# Patient Record
Sex: Female | Born: 1985 | Race: Black or African American | Hispanic: No | Marital: Single | Smoking: Never smoker
Health system: Southern US, Community
[De-identification: ages and names within clinical notes are randomized; demographics above are authoritative.]

## PROBLEM LIST (undated history)

## (undated) ENCOUNTER — Inpatient Hospital Stay (HOSPITAL_COMMUNITY): Payer: Self-pay

## (undated) DIAGNOSIS — I10 Essential (primary) hypertension: Secondary | ICD-10-CM

## (undated) HISTORY — PX: BREAST LUMPECTOMY: SHX2

---
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
## 2004-07-15 ENCOUNTER — Emergency Department (HOSPITAL_COMMUNITY): Admission: EM | Admit: 2004-07-15 | Discharge: 2004-07-15 | Payer: Self-pay | Admitting: Emergency Medicine

## 2005-04-09 ENCOUNTER — Other Ambulatory Visit: Admission: RE | Admit: 2005-04-09 | Discharge: 2005-04-09 | Payer: Self-pay | Admitting: Obstetrics and Gynecology

## 2005-04-27 ENCOUNTER — Encounter: Admission: RE | Admit: 2005-04-27 | Discharge: 2005-04-27 | Payer: Self-pay | Admitting: Allergy and Immunology

## 2006-04-10 ENCOUNTER — Other Ambulatory Visit: Admission: RE | Admit: 2006-04-10 | Discharge: 2006-04-10 | Payer: Self-pay | Admitting: Obstetrics and Gynecology

## 2006-05-12 ENCOUNTER — Emergency Department (HOSPITAL_COMMUNITY): Admission: EM | Admit: 2006-05-12 | Discharge: 2006-05-12 | Payer: Self-pay | Admitting: *Deleted

## 2007-02-27 ENCOUNTER — Emergency Department (HOSPITAL_COMMUNITY): Admission: EM | Admit: 2007-02-27 | Discharge: 2007-02-27 | Payer: Self-pay | Admitting: Emergency Medicine

## 2007-05-20 ENCOUNTER — Inpatient Hospital Stay (HOSPITAL_COMMUNITY): Admission: AD | Admit: 2007-05-20 | Discharge: 2007-05-20 | Payer: Self-pay | Admitting: Obstetrics & Gynecology

## 2007-05-27 ENCOUNTER — Inpatient Hospital Stay (HOSPITAL_COMMUNITY): Admission: AD | Admit: 2007-05-27 | Discharge: 2007-05-27 | Payer: Self-pay | Admitting: Obstetrics & Gynecology

## 2007-08-03 ENCOUNTER — Inpatient Hospital Stay (HOSPITAL_COMMUNITY): Admission: AD | Admit: 2007-08-03 | Discharge: 2007-08-03 | Payer: Self-pay | Admitting: Obstetrics & Gynecology

## 2007-08-27 ENCOUNTER — Ambulatory Visit (HOSPITAL_COMMUNITY): Admission: RE | Admit: 2007-08-27 | Discharge: 2007-08-27 | Payer: Self-pay | Admitting: Obstetrics

## 2007-09-12 ENCOUNTER — Inpatient Hospital Stay (HOSPITAL_COMMUNITY): Admission: AD | Admit: 2007-09-12 | Discharge: 2007-09-12 | Payer: Self-pay | Admitting: Obstetrics

## 2007-10-26 ENCOUNTER — Inpatient Hospital Stay (HOSPITAL_COMMUNITY): Admission: AD | Admit: 2007-10-26 | Discharge: 2007-10-26 | Payer: Self-pay | Admitting: Obstetrics & Gynecology

## 2007-11-11 ENCOUNTER — Inpatient Hospital Stay (HOSPITAL_COMMUNITY): Admission: AD | Admit: 2007-11-11 | Discharge: 2007-11-11 | Payer: Self-pay | Admitting: Obstetrics

## 2007-11-27 ENCOUNTER — Inpatient Hospital Stay (HOSPITAL_COMMUNITY): Admission: AD | Admit: 2007-11-27 | Discharge: 2007-11-27 | Payer: Self-pay | Admitting: Obstetrics & Gynecology

## 2007-12-02 ENCOUNTER — Inpatient Hospital Stay (HOSPITAL_COMMUNITY): Admission: AD | Admit: 2007-12-02 | Discharge: 2007-12-09 | Payer: Self-pay | Admitting: Obstetrics

## 2007-12-08 ENCOUNTER — Encounter: Payer: Self-pay | Admitting: Obstetrics & Gynecology

## 2007-12-21 ENCOUNTER — Inpatient Hospital Stay (HOSPITAL_COMMUNITY): Admission: AD | Admit: 2007-12-21 | Discharge: 2007-12-21 | Payer: Self-pay | Admitting: Obstetrics

## 2007-12-26 ENCOUNTER — Observation Stay (HOSPITAL_COMMUNITY): Admission: AD | Admit: 2007-12-26 | Discharge: 2007-12-26 | Payer: Self-pay | Admitting: Obstetrics & Gynecology

## 2007-12-27 ENCOUNTER — Inpatient Hospital Stay (HOSPITAL_COMMUNITY): Admission: AD | Admit: 2007-12-27 | Discharge: 2007-12-30 | Payer: Self-pay | Admitting: Obstetrics & Gynecology

## 2008-03-18 ENCOUNTER — Emergency Department (HOSPITAL_COMMUNITY): Admission: EM | Admit: 2008-03-18 | Discharge: 2008-03-18 | Payer: Self-pay | Admitting: Emergency Medicine

## 2008-04-09 ENCOUNTER — Emergency Department (HOSPITAL_COMMUNITY): Admission: EM | Admit: 2008-04-09 | Discharge: 2008-04-09 | Payer: Self-pay | Admitting: Emergency Medicine

## 2008-06-21 ENCOUNTER — Emergency Department (HOSPITAL_COMMUNITY): Admission: EM | Admit: 2008-06-21 | Discharge: 2008-06-21 | Payer: Self-pay | Admitting: Emergency Medicine

## 2008-08-02 ENCOUNTER — Emergency Department (HOSPITAL_COMMUNITY): Admission: EM | Admit: 2008-08-02 | Discharge: 2008-08-02 | Payer: Self-pay | Admitting: Emergency Medicine

## 2008-09-21 ENCOUNTER — Encounter: Payer: Self-pay | Admitting: Family Medicine

## 2008-10-18 ENCOUNTER — Encounter: Payer: Self-pay | Admitting: Family Medicine

## 2008-10-18 ENCOUNTER — Ambulatory Visit: Payer: Self-pay | Admitting: Family Medicine

## 2008-10-18 DIAGNOSIS — R030 Elevated blood-pressure reading, without diagnosis of hypertension: Secondary | ICD-10-CM | POA: Insufficient documentation

## 2008-10-18 DIAGNOSIS — J309 Allergic rhinitis, unspecified: Secondary | ICD-10-CM | POA: Insufficient documentation

## 2008-10-18 DIAGNOSIS — J302 Other seasonal allergic rhinitis: Secondary | ICD-10-CM | POA: Insufficient documentation

## 2008-11-10 ENCOUNTER — Encounter: Payer: Self-pay | Admitting: Family Medicine

## 2008-11-23 ENCOUNTER — Encounter: Payer: Self-pay | Admitting: Family Medicine

## 2008-11-30 ENCOUNTER — Encounter: Payer: Self-pay | Admitting: Family Medicine

## 2009-03-28 ENCOUNTER — Ambulatory Visit: Payer: Self-pay | Admitting: Family Medicine

## 2009-03-30 ENCOUNTER — Encounter: Payer: Self-pay | Admitting: Family Medicine

## 2009-03-30 ENCOUNTER — Ambulatory Visit: Payer: Self-pay | Admitting: Family Medicine

## 2009-12-08 ENCOUNTER — Encounter: Payer: Self-pay | Admitting: Family Medicine

## 2009-12-08 ENCOUNTER — Ambulatory Visit: Payer: Self-pay | Admitting: Family Medicine

## 2009-12-08 DIAGNOSIS — K219 Gastro-esophageal reflux disease without esophagitis: Secondary | ICD-10-CM | POA: Insufficient documentation

## 2009-12-08 DIAGNOSIS — R109 Unspecified abdominal pain: Secondary | ICD-10-CM | POA: Insufficient documentation

## 2009-12-09 ENCOUNTER — Telehealth (INDEPENDENT_AMBULATORY_CARE_PROVIDER_SITE_OTHER): Payer: Self-pay | Admitting: *Deleted

## 2009-12-23 ENCOUNTER — Emergency Department (HOSPITAL_COMMUNITY): Admission: EM | Admit: 2009-12-23 | Discharge: 2009-12-23 | Payer: Self-pay | Admitting: Emergency Medicine

## 2010-06-25 ENCOUNTER — Emergency Department (HOSPITAL_COMMUNITY): Admission: EM | Admit: 2010-06-25 | Discharge: 2010-06-25 | Payer: Self-pay | Admitting: Emergency Medicine

## 2010-09-25 ENCOUNTER — Encounter: Payer: Self-pay | Admitting: Family Medicine

## 2010-09-25 DIAGNOSIS — J45909 Unspecified asthma, uncomplicated: Secondary | ICD-10-CM | POA: Insufficient documentation

## 2011-01-23 NOTE — Miscellaneous (Signed)
  Clinical Lists Changes  Problems: Changed problem from ASTHMA (ICD-493.90) to ASTHMA, PERSISTENT (ICD-493.90) 

## 2011-03-11 LAB — URINALYSIS, ROUTINE W REFLEX MICROSCOPIC
Glucose, UA: NEGATIVE mg/dL
Hgb urine dipstick: NEGATIVE
Nitrite: NEGATIVE
Urobilinogen, UA: 0.2 mg/dL (ref 0.0–1.0)

## 2011-03-11 LAB — CBC
HCT: 35.9 % — ABNORMAL LOW (ref 36.0–46.0)
Hemoglobin: 11.9 g/dL — ABNORMAL LOW (ref 12.0–15.0)
MCH: 29.1 pg (ref 26.0–34.0)
RBC: 4.1 MIL/uL (ref 3.87–5.11)
RDW: 11.9 % (ref 11.5–15.5)
WBC: 8.5 10*3/uL (ref 4.0–10.5)

## 2011-03-11 LAB — WET PREP, GENITAL: Trich, Wet Prep: NONE SEEN

## 2011-05-08 NOTE — H&P (Signed)
NAMESHAAKIRA, BORRERO               ACCOUNT NO.:  [id number]   MEDICAL RECORD NO.:  [id number]          PATIENT TYPE:  INP   LOCATION:  9168                          [facility]ILITY:  [facility]   PHYSICIAN:  Roseanna Rainbow, M.D.DATE OF BIRTH:  1986-07-01   DATE OF ADMISSION:  12/27/2007  DATE OF DISCHARGE:                              HISTORY & PHYSICAL   CHIEF COMPLAINT:  The patient is a 25 year old para 0 with an  intrauterine pregnancy at 37 weeks complaining of uterine contractions.   HISTORY OF PRESENT ILLNESS:  Please see the above.   ALLERGIES:  NO KNOWN DRUG ALLERGIES.   MEDICATIONS:  None.   OB RISK FACTORS:  GBS asymptomatic bacteriuria.   PRENATAL LABS:  Quad screen negative.  Platelets 223,000, hemoglobin  10.2, hematocrit 34.1.  Chlamydia probe negative.  Urine culture and  sensitivity with insignificant growth on July 01, 2007.  Sickle cell  negative, HIV negative. 1-hour GTT 112, rubella immune, RPR nonreactive.  Blood type O positive, antibody screen negative, hepatitis B surface  antigen negative.  On May 27, 2007, urine culture and sensitivity 10,000  colonies per mL, group beta strep.   PAST GYN HISTORY:  Noncontributory.   PAST MEDICAL HISTORY:  Asthma.   PAST SURGICAL HISTORY:  1. Herniorrhaphy.  2. Benign cyst removed from left breast.   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  Does not give any  significant history of alcohol usage.  Has no significant smoking  history.   FAMILY HISTORY:  No major illnesses known.   PHYSICAL EXAMINATION:  VITAL SIGNS:  Stable, afebrile.  PELVIC:  Fetal heart tracing reassuring.  Sterile vaginal exam per the  RN:  She is 6 cm dilated, 90% effaced.   ASSESSMENT:  Intrauterine pregnancy at 37 weeks, protracted latent  phase, fetal heart tracing consistent with fetal wellbeing.  Group beta  strep positive.   PLAN:  Admission, penicillin, GBS prophylaxis, augmentation of labor  with low-dose Pitocin per  protocol.  Monitor progress.      Roseanna Rainbow, M.D.  Electronically Signed     LAJ/MEDQ  D:  12/28/2007  T:  12/28/2007  Job:  [id number]

## 2011-05-11 NOTE — Discharge Summary (Signed)
Susan Crawford, Susan Crawford               ACCOUNT NO.:  [id number]   MEDICAL RECORD NO.:  [id number]          PATIENT TYPE:  INP   LOCATION:  9155                          [facility]   PHYSICIAN:  Roseanna Rainbow, M.D.DATE OF BIRTH:  [birth year]   DATE OF ADMISSION:  12/02/2007  DATE OF DISCHARGE:  12/09/2007                               DISCHARGE SUMMARY   CHIEF COMPLAINT:  The patient is a 25 year old gravida 1 with an  estimated date of confinement of January 25, who presents complaining of  uterine contractions and vaginal bleeding.   HISTORY OF PRESENT ILLNESS:  Please see the above.   PAST SURGICAL HISTORY:  Herniorrhaphy and excision of a breast cyst.   PAST MEDICAL HISTORY:  She denies.   MEDICATIONS:  Prenatal vitamins.   ALLERGIES:  No known drug allergies.   SOCIAL HISTORY:  No tobacco, ethanol or drug use.   PHYSICAL EXAM:  VITAL SIGNS:  Stable, afebrile.  LUNGS:  Clear to auscultation bilaterally.  HEART:  Regular rate and rhythm.  STERILE VAGINAL EXAM:  The cervix is 1 cm dilated, 50% effaced, with the  vertex at a -1 station.  Fetal heart tracing reactive.  Uterine  contractions every 3-5 minutes on tocodynamometer.   Ultrasound:  The cervix is shortened.   ASSESSMENT AND PLAN:  Intrauterine pregnancy at 33+ weeks, preterm  uterine contractions.  The plan was admission, magnesium sulfate  tocolysis.   HOSPITAL COURSE:  The patient was admitted.  Magnesium sulfate was  initiated parenterally.  She was adequately tocolyzed.  On December 11  her cervix was 2 cm dilated, 50% effaced, soft and midposition.  The  magnesium sulfate was discontinued.  It was felt that she had a likely  viral gastroenteritis.  On December 12 she complained of chest tightness  and her O2 saturations were greater than 95% on room air.  Chest exam:  Occasional rhonchi.  The patient also has a history of asthma and this  was felt to be an acute asthma exacerbation.  The chest x-ray  showed an  interstitial edema.  Her pulmonary status remained stable.  Maternal-  fetal medicine was consulted.  A maternal echocardiogram was recommended  to rule out cardiac disease, also a 24-hour urine was also recommended  to rule out preeclampsia.  Heightened fetal surveillance was  recommended, including weekly NSTs and serial growth ultrasounds  monthly.  The maternal echocardiogram was normal.  A repeat chest x-ray:  There were no infiltrates or pleural effusions.  The 24-hour urine was  normal with 68 mg per day of protein.  At this point the patient was  discharged home.   DISCHARGE DIAGNOSES:  1. Intrauterine pregnancy at 34+ weeks with threatened preterm labor.  2. Third trimester vaginal bleeding.  3. Asthma exacerbation.  4. Mild pulmonary edema.  5. Possible viral syndrome.   CONDITION:  Improved and stable.   DIET:  Regular.   ACTIVITY:  Modified bed rest, pelvic rest.   MEDICATIONS:  Zofran, albuterol inhaler.   DISPOSITION:  The patient had an appointment in the office on December  19  at 10 a.m.      Roseanna Rainbow, M.D.  Electronically Signed     LAJ/MEDQ  D:  01/09/2008  T:  01/10/2008  Job:  [id number]

## 2011-05-11 NOTE — Op Note (Signed)
[place]  Patient:    Susan Crawford, Susan Crawford Visit Number: [id number] MRN: [id number]          Service Type: DSU Location: [facility] Attending Physician:  Vikki Ports Dictated by:   Vikki Ports, M.D. Proc. Date: 01/30/02 Admit Date:  01/30/2002                             Operative Report  PREOPERATIVE DIAGNOSIS:  Left breast mass.  POSTOPERATIVE DIAGNOSIS:  Left breast mass.  PROCEDURE:  Left breast biopsy.  SURGEON:  Vikki Ports, M.D.  ANESTHESIA:  Local MAC.  DESCRIPTION OF PROCEDURE:  The patient was taken to the operating room and placed in the supine position.  After adequate anesthesia was induced using MAC technique, the left breast was then prepped and draped in a normal sterile fashion.  Using 1% lidocaine local anesthesia, the skin and subcutaneous tissue surrounding the mass was anesthetized.  A small curvilinear incision was made in the 6 oclock region of the left breast.  Dissected down through subcutaneous tissue until palpable mobile mass.  It was grasped with a 3-0 nylon suture and delivered up in through the wound.  All adhesions were taken off of it.  It was completely excised and sent for pathologic evaluation.  Adequate hemostasis was ensured and the skin was closed with subcuticular 4-0 Monocryl.  Steri-Strips and a sterile dressing was applied. The patient tolerated the procedure well and went to PACU in good condition. Dictated by:   Vikki Ports, M.D. Attending Physician:  Danna Hefty R. DD:  01/30/02 TD:  01/31/02 Job: [id number]

## 2011-07-27 ENCOUNTER — Emergency Department (HOSPITAL_COMMUNITY)
Admission: EM | Admit: 2011-07-27 | Discharge: 2011-07-28 | Disposition: A | Discharge status: Home / Self Care | Payer: Self-pay | Attending: Emergency Medicine | Admitting: Emergency Medicine

## 2011-07-27 DIAGNOSIS — M545 Low back pain, unspecified: Secondary | ICD-10-CM | POA: Insufficient documentation

## 2011-07-27 DIAGNOSIS — R630 Anorexia: Secondary | ICD-10-CM | POA: Insufficient documentation

## 2011-07-27 DIAGNOSIS — N739 Female pelvic inflammatory disease, unspecified: Secondary | ICD-10-CM | POA: Insufficient documentation

## 2011-07-27 DIAGNOSIS — R509 Fever, unspecified: Secondary | ICD-10-CM | POA: Insufficient documentation

## 2011-07-27 DIAGNOSIS — N898 Other specified noninflammatory disorders of vagina: Secondary | ICD-10-CM | POA: Insufficient documentation

## 2011-07-27 DIAGNOSIS — R112 Nausea with vomiting, unspecified: Secondary | ICD-10-CM | POA: Insufficient documentation

## 2011-07-27 LAB — DIFFERENTIAL
Basophils Relative: 0 % (ref 0–1)
Eosinophils Absolute: 0 10*3/uL (ref 0.0–0.7)
Eosinophils Relative: 1 % (ref 0–5)
Monocytes Relative: 12 % (ref 3–12)

## 2011-07-27 LAB — POCT PREGNANCY, URINE: Preg Test, Ur: NEGATIVE

## 2011-07-27 LAB — CBC
HCT: 37.9 % (ref 36.0–46.0)
MCHC: 31.7 g/dL (ref 30.0–36.0)
MCV: 86.3 fL (ref 78.0–100.0)
RDW: 12 % (ref 11.5–15.5)

## 2011-07-28 LAB — BASIC METABOLIC PANEL
CO2: 25 mEq/L (ref 19–32)
Calcium: 9.8 mg/dL (ref 8.4–10.5)
Chloride: 100 mEq/L (ref 96–112)
Creatinine, Ser: 0.78 mg/dL (ref 0.50–1.10)
GFR calc Af Amer: >60 mL/min (ref >60)
Glucose, Bld: 117 mg/dL — ABNORMAL HIGH (ref 70–99)
Potassium: 3.5 mEq/L (ref 3.5–5.1)

## 2011-07-28 LAB — URINALYSIS, ROUTINE W REFLEX MICROSCOPIC
Bilirubin Urine: NEGATIVE
Glucose, UA: NEGATIVE mg/dL
Leukocytes, UA: NEGATIVE
Nitrite: NEGATIVE
pH: 6.5 (ref 5.0–8.0)

## 2011-09-13 LAB — CBC
HCT: 31.2 — ABNORMAL LOW
Hemoglobin: 11.9 — ABNORMAL LOW
MCHC: 33.2
MCHC: 33.8
MCHC: 34
MCV: 86.6
MCV: 86.9
MCV: 87.1
Platelets: 210
Platelets: 234
RBC: 3.36 — ABNORMAL LOW
RBC: 4.05
RDW: 12.9
WBC: 14.4 — ABNORMAL HIGH
WBC: 8.1

## 2011-09-13 LAB — COMPREHENSIVE METABOLIC PANEL
ALT: 12
Albumin: 2.4 — ABNORMAL LOW
Alkaline Phosphatase: 180 — ABNORMAL HIGH
BUN: 4 — ABNORMAL LOW
Calcium: 9.1
Chloride: 106
Creatinine, Ser: 0.81
GFR calc Af Amer: >60
GFR calc non Af Amer: >60
Total Protein: 5.9 — ABNORMAL LOW

## 2011-09-13 LAB — RPR: RPR Ser Ql: NONREACTIVE

## 2011-09-13 LAB — LACTATE DEHYDROGENASE: LDH: 148

## 2011-09-20 LAB — COMPREHENSIVE METABOLIC PANEL
ALT: 14
BUN: 10
Calcium: 9.7
Creatinine, Ser: 0.77
Glucose, Bld: 90
Sodium: 137
Total Protein: 8.4 — ABNORMAL HIGH

## 2011-09-20 LAB — CBC
Hemoglobin: 13.4
MCHC: 32.8
MCV: 85.7
RDW: 11.9

## 2011-09-20 LAB — URINALYSIS, ROUTINE W REFLEX MICROSCOPIC
Glucose, UA: NEGATIVE
Nitrite: NEGATIVE
Specific Gravity, Urine: 1.023
pH: 6

## 2011-09-20 LAB — DIFFERENTIAL
Lymphocytes Relative: 15
Lymphs Abs: 1.1
Monocytes Relative: 8
Neutro Abs: 5.5
Neutrophils Relative %: 73

## 2011-09-20 LAB — URINE MICROSCOPIC-ADD ON

## 2011-09-20 LAB — POCT PREGNANCY, URINE: Preg Test, Ur: NEGATIVE

## 2011-09-20 LAB — LIPASE, BLOOD: Lipase: 21

## 2011-09-28 LAB — CREATININE CLEARANCE, URINE, 24 HOUR
Collection Interval-CRCL: 24
Creatinine Clearance: 181 — ABNORMAL HIGH
Creatinine, 24H Ur: 1462
Creatinine, Urine: 43

## 2011-09-28 LAB — URINALYSIS, ROUTINE W REFLEX MICROSCOPIC
Glucose, UA: NEGATIVE
Leukocytes, UA: NEGATIVE
Protein, ur: NEGATIVE
Specific Gravity, Urine: <1.005 — ABNORMAL LOW
pH: 5.5

## 2011-09-28 LAB — COMPREHENSIVE METABOLIC PANEL
AST: 14
Albumin: 2.5 — ABNORMAL LOW
Alkaline Phosphatase: 131 — ABNORMAL HIGH
Chloride: 105
Creatinine, Ser: 0.56
GFR calc Af Amer: >60
Potassium: 3.7
Total Bilirubin: 0.5
Total Protein: 6.1

## 2011-09-28 LAB — PROTEIN, URINE, 24 HOUR
Protein, 24H Urine: 68
Urine Total Volume-UPROT: 3400

## 2011-09-28 LAB — URINE MICROSCOPIC-ADD ON: WBC, UA: NONE SEEN

## 2011-09-28 LAB — CBC
Platelets: 233
RDW: 12.8
WBC: 12.4 — ABNORMAL HIGH

## 2011-10-01 ENCOUNTER — Emergency Department (HOSPITAL_COMMUNITY)
Admission: EM | Admit: 2011-10-01 | Discharge: 2011-10-01 | Disposition: A | Discharge status: Home / Self Care | Payer: Medicaid Other | Attending: Emergency Medicine | Admitting: Emergency Medicine

## 2011-10-01 DIAGNOSIS — N949 Unspecified condition associated with female genital organs and menstrual cycle: Secondary | ICD-10-CM | POA: Insufficient documentation

## 2011-10-01 DIAGNOSIS — J069 Acute upper respiratory infection, unspecified: Secondary | ICD-10-CM | POA: Insufficient documentation

## 2011-10-01 LAB — URINALYSIS, ROUTINE W REFLEX MICROSCOPIC
Bilirubin Urine: NEGATIVE
Glucose, UA: NEGATIVE
Glucose, UA: NEGATIVE
Glucose, UA: NEGATIVE
Glucose, UA: NEGATIVE mg/dL
Hgb urine dipstick: NEGATIVE
Hgb urine dipstick: NEGATIVE
Hgb urine dipstick: NEGATIVE
Ketones, ur: NEGATIVE
Ketones, ur: NEGATIVE
Leukocytes, UA: NEGATIVE
Nitrite: NEGATIVE
Protein, ur: NEGATIVE
Protein, ur: NEGATIVE
Specific Gravity, Urine: 1.01
Specific Gravity, Urine: 1.028 (ref 1.005–1.030)
Urobilinogen, UA: 0.2
pH: 6.5
pH: 7.5
pH: 5.5 (ref 5.0–8.0)

## 2011-10-01 LAB — COMPREHENSIVE METABOLIC PANEL
ALT: 16
AST: 23
AST: 25
Albumin: 2.9 — ABNORMAL LOW
Alkaline Phosphatase: 137 — ABNORMAL HIGH
Alkaline Phosphatase: 149 — ABNORMAL HIGH
BUN: 3 — ABNORMAL LOW
BUN: 3 — ABNORMAL LOW
CO2: 26
Calcium: 9.1
Chloride: 105
Chloride: 106
Creatinine, Ser: 0.55
Creatinine, Ser: 0.62
GFR calc Af Amer: >60
GFR calc Af Amer: >60
GFR calc non Af Amer: >60
Glucose, Bld: 85
Potassium: 3.3 — ABNORMAL LOW
Potassium: 3.7
Sodium: 133 — ABNORMAL LOW
Total Bilirubin: 0.6
Total Bilirubin: 0.8
Total Protein: 6.5
Total Protein: 6.6

## 2011-10-01 LAB — CBC
HCT: 33.1 — ABNORMAL LOW
HCT: 33.7 — ABNORMAL LOW
Hemoglobin: 11.1 — ABNORMAL LOW
MCHC: 33.6
MCV: 88.8
Platelets: 220
RBC: 3.24 — ABNORMAL LOW
RDW: 12.4
RDW: 12.5
WBC: 19.9 — ABNORMAL HIGH
WBC: 8.3

## 2011-10-01 LAB — DIFFERENTIAL
Basophils Relative: 1
Lymphs Abs: 1.3
Monocytes Relative: 10
Neutro Abs: 5.7
Neutrophils Relative %: 72

## 2011-10-01 LAB — WET PREP, GENITAL
Trich, Wet Prep: NONE SEEN
Trich, Wet Prep: NONE SEEN
Yeast Wet Prep HPF POC: NONE SEEN

## 2011-10-01 LAB — GC/CHLAMYDIA PROBE AMP, GENITAL: Chlamydia, DNA Probe: NEGATIVE

## 2011-10-01 LAB — URINE CULTURE: Culture: NO GROWTH

## 2011-10-01 LAB — URINE MICROSCOPIC-ADD ON

## 2011-10-02 LAB — URINALYSIS, ROUTINE W REFLEX MICROSCOPIC
Bilirubin Urine: NEGATIVE
Glucose, UA: NEGATIVE
Hgb urine dipstick: NEGATIVE
Nitrite: NEGATIVE
Protein, ur: NEGATIVE
Protein, ur: NEGATIVE
Specific Gravity, Urine: 1.01
Specific Gravity, Urine: 1.015
Urobilinogen, UA: 0.2
Urobilinogen, UA: 0.2

## 2011-10-02 LAB — URINE MICROSCOPIC-ADD ON

## 2011-10-02 LAB — GC/CHLAMYDIA PROBE AMP, GENITAL
Chlamydia, DNA Probe: NEGATIVE
GC Probe Amp, Genital: NEGATIVE

## 2011-10-04 LAB — URINALYSIS, ROUTINE W REFLEX MICROSCOPIC
Bilirubin Urine: NEGATIVE
Glucose, UA: NEGATIVE
Hgb urine dipstick: NEGATIVE
Ketones, ur: NEGATIVE
Nitrite: NEGATIVE
Protein, ur: NEGATIVE
Specific Gravity, Urine: >1.03 — ABNORMAL HIGH
Urobilinogen, UA: 0.2
pH: 6

## 2011-10-04 LAB — URINE MICROSCOPIC-ADD ON

## 2011-10-08 LAB — URINALYSIS, ROUTINE W REFLEX MICROSCOPIC
Bilirubin Urine: NEGATIVE
Glucose, UA: NEGATIVE
Hgb urine dipstick: NEGATIVE
Ketones, ur: NEGATIVE
Protein, ur: NEGATIVE
pH: 7

## 2011-10-08 LAB — WET PREP, GENITAL: Clue Cells Wet Prep HPF POC: NONE SEEN

## 2011-10-11 LAB — URINALYSIS, ROUTINE W REFLEX MICROSCOPIC
Ketones, ur: 15 — AB
Leukocytes, UA: NEGATIVE
Nitrite: NEGATIVE
Protein, ur: 30 — AB

## 2011-10-11 LAB — URINE CULTURE: Colony Count: 10000

## 2011-11-22 ENCOUNTER — Emergency Department (HOSPITAL_COMMUNITY): Payer: Medicaid Other

## 2011-11-22 ENCOUNTER — Encounter: Payer: Self-pay | Admitting: Nurse Practitioner

## 2011-11-22 ENCOUNTER — Emergency Department (HOSPITAL_COMMUNITY)
Admission: EM | Admit: 2011-11-22 | Discharge: 2011-11-22 | Disposition: A | Discharge status: Home / Self Care | Payer: Medicaid Other | Attending: Emergency Medicine | Admitting: Emergency Medicine

## 2011-11-22 DIAGNOSIS — J45909 Unspecified asthma, uncomplicated: Secondary | ICD-10-CM | POA: Insufficient documentation

## 2011-11-22 DIAGNOSIS — M79609 Pain in unspecified limb: Secondary | ICD-10-CM | POA: Insufficient documentation

## 2011-11-22 DIAGNOSIS — M722 Plantar fascial fibromatosis: Secondary | ICD-10-CM

## 2011-11-22 NOTE — ED Provider Notes (Signed)
History     CSN: [id number] Arrival date & time: 11/22/2011 11:17 AM   First MD Initiated Contact with Patient 11/22/11 1231      Chief Complaint  Patient presents with  . Foot Pain    (Consider location/radiation/quality/duration/timing/severity/associated sxs/prior treatment) HPI Comments: Patient reports pain at the bottom of her left foot just in front of her left heel over the last several days. She reports bearing weight makes pain worse. She has been taking some "pain medication" and trying to rest it without any significant relief. She has been wearing soft soled slippers which she states does improve her pain. She denies any trauma, injury, redness, rash, ulcerations to the bottom of her foot. She denies any pain to her Achilles area, left calf or elsewhere on her lower sternum and knee. She denies any numbness or weakness. No fever. She does stand and walk at her job for long periods of time, but denies any increase in her usual workload recently.  Patient is a 25 y.o. female presenting with lower extremity pain. The history is provided by the patient.  Foot Pain    Past Medical History  Diagnosis Date  . Asthma     Past Surgical History  Procedure Date  . Breast lumpectomy     History reviewed. No pertinent family history.  History  Substance Use Topics  . Smoking status: Never Smoker   . Smokeless tobacco: Not on file  . Alcohol Use: Yes     rare    OB History    Grav Para Term Preterm Abortions TAB SAB Ect Mult Living                  Review of Systems  Constitutional: Negative.   Cardiovascular: Negative for leg swelling.  Musculoskeletal: Positive for arthralgias.  Skin: Negative for color change, pallor, rash and wound.  Neurological: Negative for weakness and numbness.    Allergies  Review of patient's allergies indicates no known allergies.  Home Medications   Current Outpatient Rx  Name Route Sig Dispense Refill  . ALBUTEROL SULFATE  HFA 108 (90 BASE) MCG/ACT IN AERS Inhalation Inhale 1 puff into the lungs every 4 (four) hours as needed. For shortness of breath    . CETIRIZINE HCL 10 MG PO TABS Oral Take 10 mg by mouth daily as needed. For allergies      BP 121/81  Pulse 80  Temp(Src) 97.9 F (36.6 C) (Oral)  Resp 16  Ht 5\' 5"  (1.651 m)  Wt 172 lb (78.019 kg)  BMI 28.62 kg/m2  SpO2 99%  Physical Exam  Nursing note and vitals reviewed. Constitutional: She is oriented to person, place, and time. She appears well-developed and well-nourished.  Musculoskeletal: Normal range of motion. She exhibits tenderness. She exhibits no edema.       Feet:  Neurological: She is alert and oriented to person, place, and time.  Skin: Skin is warm and dry.  Psychiatric: She has a normal mood and affect.    ED Course  Procedures (including critical care time)  Labs Reviewed - No data to display Dg Foot Complete Right  11/22/2011  *RADIOLOGY REPORT*  Clinical Data: Heel pain.  RIGHT FOOT COMPLETE - 3+ VIEW  Comparison: None.  Findings: No acute bony abnormality.  Specifically, no fracture, subluxation, or dislocation.  Soft tissues are intact.  IMPRESSION: No acute bony abnormality.  Original Report Authenticated By: Cyndie Chime, M.D.     1. Plantar cellulitis  MDM    Probably plantar fasciitis.  xrays I reviewed, neg per radiologist.  RICE at home.  Work note.        Gavin Pound. Tyric Rodeheaver, MD 11/22/11 1306

## 2011-11-22 NOTE — Discharge Instructions (Signed)
 Be sure to keep your left leg elevated when resting. He may want to use ice packs for about 15 minutes in the bottom of her foot 3 times a day for the next 3 days. I would also advise continuing to take ibuprofen  400-600 mg with food 3 times a day. This will help decrease any swelling and inflammation. I also would encourage that you buy gel inserts for your shoes which will also decrease the pressure on your feet. Pain should subside in the next one to 2 weeks. If this does not occur I recommend following up with an orthopedist, sports physician or your regular doctor.

## 2011-11-22 NOTE — ED Notes (Signed)
C/o R heel pain x 2 days. States no injuries but pain is increasingly worse since onset. Ambulatory

## 2012-03-09 ENCOUNTER — Emergency Department (HOSPITAL_COMMUNITY)
Admission: EM | Admit: 2012-03-09 | Discharge: 2012-03-10 | Disposition: A | Discharge status: Home / Self Care | Payer: BC Managed Care – PPO | Attending: Emergency Medicine | Admitting: Emergency Medicine

## 2012-03-09 ENCOUNTER — Encounter (HOSPITAL_COMMUNITY): Payer: Self-pay | Admitting: *Deleted

## 2012-03-09 DIAGNOSIS — R05 Cough: Secondary | ICD-10-CM | POA: Insufficient documentation

## 2012-03-09 DIAGNOSIS — R51 Headache: Secondary | ICD-10-CM | POA: Insufficient documentation

## 2012-03-09 DIAGNOSIS — R059 Cough, unspecified: Secondary | ICD-10-CM | POA: Insufficient documentation

## 2012-03-09 DIAGNOSIS — J329 Chronic sinusitis, unspecified: Secondary | ICD-10-CM | POA: Insufficient documentation

## 2012-03-09 MED ORDER — AMOXICILLIN 500 MG PO CAPS: 500.0000 mg | ORAL_CAPSULE | Freq: Three times a day (TID) | ORAL | Status: DC

## 2012-03-09 MED ORDER — AMOXICILLIN 500 MG PO CAPS: 500.0000 mg | ORAL_CAPSULE | Freq: Three times a day (TID) | ORAL | Status: AC

## 2012-03-09 MED ORDER — IBUPROFEN 800 MG PO TABS
800.0000 mg | ORAL_TABLET | Freq: Once | ORAL | Status: AC
  Administered 2012-03-10: 800 mg via ORAL
  Filled 2012-03-09: qty 1

## 2012-03-09 MED ORDER — CETIRIZINE-PSEUDOEPHEDRINE ER 5-120 MG PO TB12: 1.0000 | ORAL_TABLET | Freq: Every day | ORAL | Status: DC

## 2012-03-09 NOTE — ED Provider Notes (Signed)
History     CSN: [id number]  Arrival date & time 03/09/12  2101   First MD Initiated Contact with Patient 03/09/12 2333      Chief Complaint  Patient presents with  . Nasal Congestion  . Cough  . Headache     HPI  History provided by the patient. Patient is a 26 year old female with history of allergies and asthma presents with complaints of nasal congestion, sinus pressure and slight cough for the past 3 weeks. Symptoms have been persistent and are described as moderate. Patient has been using Zyrtec with no significant improvement of symptoms. Today patient also reports having generalized headache. She reports increased fatigue over the past several weeks. With slight decrease in appetite. Patient denies having any fever, chills, sweats, nausea, vomiting or diarrhea. SHe denies any chest pain or shortness of breath.   Past Medical History  Diagnosis Date  . Asthma     Past Surgical History  Procedure Date  . Breast lumpectomy     No family history on file.  History  Substance Use Topics  . Smoking status: Never Smoker   . Smokeless tobacco: Not on file  . Alcohol Use: Yes     rare    OB History    Grav Para Term Preterm Abortions TAB SAB Ect Mult Living                  Review of Systems  Constitutional: Positive for appetite change. Negative for fever and chills.  HENT: Positive for congestion, sore throat, rhinorrhea and sinus pressure. Negative for ear pain.   Respiratory: Positive for cough. Negative for shortness of breath and wheezing.   Cardiovascular: Negative for chest pain.  Gastrointestinal: Negative for nausea, vomiting, abdominal pain, diarrhea and constipation.  All other systems reviewed and are negative.    Allergies  Review of patient's allergies indicates no known allergies.  Home Medications   Current Outpatient Rx  Name Route Sig Dispense Refill  . GOODY HEADACHE PO Oral Take 1 packet by mouth every 6 (six) hours as needed. For  headache    . CETIRIZINE HCL 10 MG PO TABS Oral Take 10 mg by mouth daily as needed. For allergies      BP 133/86  Pulse 82  Temp(Src) 98.8 F (37.1 C) (Oral)  Resp 19  SpO2 100%  LMP 03/09/2012  Physical Exam  Nursing note and vitals reviewed. Constitutional: She is oriented to person, place, and time. She appears well-developed and well-nourished. No distress.  HENT:  Head: Normocephalic.  Right Ear: Tympanic membrane normal.       Mild tenderness over bilateral maxillary sinuses. Nasal congestion with poor air movement her nostrils. Nostril mucosa edematous with green yellow mucus. Mild erythema left TM.  Neck: Normal range of motion. Neck supple.       No meningeal sign  Cardiovascular: Normal rate and regular rhythm.   Pulmonary/Chest: Effort normal and breath sounds normal. No respiratory distress. She has no wheezes. She has no rales.  Abdominal: Soft. She exhibits no distension. There is no tenderness.  Lymphadenopathy:    She has no cervical adenopathy.  Neurological: She is alert and oriented to person, place, and time.  Skin: Skin is warm and dry. No rash noted.  Psychiatric: She has a normal mood and affect. Her behavior is normal.    ED Course  Procedures     1. Sinusitis      MDM  11:45 PM patient seen and evaluated. Patient  in no acute distress.        Angus Seller, [place] 03/10/12 [id number]

## 2012-03-09 NOTE — ED Notes (Signed)
Pt states that she has had nasal congestion, a cough, and a headache x 3 weeks.  Pt also complains that she is unable to sleep.

## 2012-03-10 NOTE — Discharge Instructions (Signed)
You were seen and evaluated today for your symptoms of nasal congestion, sinus pressure and headache. At this time your providers are worried of a sinus infection causing her symptoms. You're provided and she should for an antibiotic to take for the next 10 days. Please take this for the full length of time. Continue drink plenty of fluids to stay hydrated. Please followup to primary care provider.  Sinusitis Sinuses are air pockets within the bones of your face. The growth of bacteria within a sinus leads to infection. The infection prevents the sinuses from draining. This infection is called sinusitis. SYMPTOMS  There will be different areas of pain depending on which sinuses have become infected.  The maxillary sinuses often produce pain beneath the eyes.   Frontal sinusitis may cause pain in the middle of the forehead and above the eyes.  Other problems (symptoms) include:  Toothaches.   Colored, pus-like (purulent) drainage from the nose.   Swelling, warmth, and tenderness over the sinus areas may be signs of infection.  TREATMENT  Sinusitis is most often determined by an exam.X-rays may be taken. If x-rays have been taken, make sure you obtain your results or find out how you are to obtain them. Your caregiver may give you medications (antibiotics). These are medications that will help kill the bacteria causing the infection. You may also be given a medication (decongestant) that helps to reduce sinus swelling.  HOME CARE INSTRUCTIONS   Only take over-the-counter or prescription medicines for pain, discomfort, or fever as directed by your caregiver.   Drink extra fluids. Fluids help thin the mucus so your sinuses can drain more easily.   Applying either moist heat or ice packs to the sinus areas may help relieve discomfort.   Use saline nasal sprays to help moisten your sinuses. The sprays can be found at your local drugstore.  SEEK IMMEDIATE MEDICAL CARE IF:  You have a fever.    You have increasing pain, severe headaches, or toothache.   You have nausea, vomiting, or drowsiness.   You develop unusual swelling around the face or trouble seeing.  MAKE SURE YOU:   Understand these instructions.   Will watch your condition.   Will get help right away if you are not doing well or get worse.  Document Released: 12/10/2005 Document Revised: 11/29/2011 Document Reviewed: 07/09/2007 [facility] Patient Information 2012 [place], [place].    RESOURCE GUIDE  Dental Problems  Patients with Medicaid: [facility] [phone] [address]                                           [phone] [address] Phone:  [phone]                                                  Phone:  [phone]  If unable to pay or uninsured, contact:  [facility] or [facility]. to become qualified for the adult dental clinic.  Chronic Pain Problems Contact [facility]  [phone] Patients need to be referred by their primary care doctor.  Insufficient Money for  Medicine Contact United Way:  call "211" or [facility] [phone].  No Primary Care Doctor Call [facility]  [phone] Other agencies that provide inexpensive medical care    [facility]  [phone]    [facility]  [phone]    [facility]  9858752853    [facility]  [phone]    [facility]  [phone]    [facility]  [phone]  Psychological Services [facility]  [phone] [facility] Services  [phone] [facility]   [phone] (emergency services ([phone])  Substance Abuse Resources [facility]  920-736-7126 [facility] 870-650-7989 [facility] [phone] Floydene Flock [phone] Residential & Outpatient Substance Abuse Program  [phone] [phone]  Abuse/Neglect [facility] Child Abuse Hotline 8285717010 [facility] Child Abuse Hotline 212-832-2757 (After Hours)  Emergency Shelter [facility] 404-459-9249  Maternity Homes [facility] at [facility] [place] [facility] 343 527 6357 [place] Alert Services [phone]  MRSA Hotline #:   (219)178-5548    [facility] Resources  [facility]     [employer]                          [facility] Dept. [address] [place]                       [address]      [address]  Blondell Reveal Phone:  [phone]                                   Phone:  [phone]                 Phone:  [phone]  [facility] Mental Health Phone:  [phone]  [facility] Child Abuse Hotline 9077549934 [phone] (After Hours)

## 2012-03-14 NOTE — ED Provider Notes (Signed)
Medical screening examination/treatment/procedure(s) were performed by non-physician practitioner and as supervising physician I was immediately available for consultation/collaboration.  Jawanda Passey, MD 03/14/12 1925 

## 2012-04-07 ENCOUNTER — Encounter (HOSPITAL_COMMUNITY): Payer: Self-pay | Admitting: *Deleted

## 2012-04-07 ENCOUNTER — Inpatient Hospital Stay (HOSPITAL_COMMUNITY)
Admission: AD | Admit: 2012-04-07 | Discharge: 2012-04-07 | Disposition: A | Discharge status: Home / Self Care | Payer: Medicaid Other | Source: Ambulatory Visit | Attending: Obstetrics and Gynecology | Admitting: Obstetrics and Gynecology

## 2012-04-07 DIAGNOSIS — R109 Unspecified abdominal pain: Secondary | ICD-10-CM | POA: Insufficient documentation

## 2012-04-07 DIAGNOSIS — N72 Inflammatory disease of cervix uteri: Secondary | ICD-10-CM | POA: Insufficient documentation

## 2012-04-07 DIAGNOSIS — N949 Unspecified condition associated with female genital organs and menstrual cycle: Secondary | ICD-10-CM | POA: Insufficient documentation

## 2012-04-07 LAB — URINALYSIS, ROUTINE W REFLEX MICROSCOPIC
Nitrite: NEGATIVE
Specific Gravity, Urine: 1.01 (ref 1.005–1.030)
Urobilinogen, UA: 0.2 mg/dL (ref 0.0–1.0)

## 2012-04-07 LAB — WET PREP, GENITAL: Clue Cells Wet Prep HPF POC: NONE SEEN

## 2012-04-07 LAB — POCT PREGNANCY, URINE: Preg Test, Ur: NEGATIVE

## 2012-04-07 MED ORDER — CEFTRIAXONE SODIUM 250 MG IJ SOLR
250.0000 mg | Freq: Once | INTRAMUSCULAR | Status: AC
  Administered 2012-04-07: 250 mg via INTRAMUSCULAR
  Filled 2012-04-07: qty 250

## 2012-04-07 MED ORDER — AZITHROMYCIN 250 MG PO TABS
1000.0000 mg | ORAL_TABLET | Freq: Once | ORAL | Status: AC
  Administered 2012-04-07: 1000 mg via ORAL
  Filled 2012-04-07: qty 4

## 2012-04-07 MED ORDER — METRONIDAZOLE 500 MG PO TABS: 500.0000 mg | ORAL_TABLET | Freq: Two times a day (BID) | ORAL | Status: AC

## 2012-04-07 NOTE — Discharge Instructions (Signed)
Cervicitis   Cervicitis is a soreness and swelling (inflammation) of the cervix. Your cervix is located at the bottom of your uterus which opens up to the vagina.   CAUSES   Sexually transmitted infections (STIs).   Allergic reaction.   Medicines or birth control devices that are put in the vagina.   Injury to the cervix.   Bacterial infections.   SYMPTOMS   There may be no symptoms. If symptoms occur, they may include:   Grey, white, yellow, or bad smelling vaginal discharge.   Pain or itching of the area outside the vagina.   Painful sexual intercourse.   Lower abdominal or lower back pain, especially during intercourse.   Frequent urination.   Abnormal vaginal bleeding between periods, after sexual intercourse, or after menopause.   Pressure or a heavy feeling in the pelvis.   DIAGNOSIS   Diagnosis is made after a pelvic exam. Other tests may include:   Examination of any discharge under a microscope (wet prep).   A Pap test.   TREATMENT   Treatment will depend on the cause of cervicitis. If it is caused by an STI, both you and your partner will need to be treated. Antibiotic medicines will be given.   HOME CARE INSTRUCTIONS   Do not have sexual intercourse until your caregiver says it is okay.   Do not have sexual intercourse until your partner has been treated if your cervicitis is caused by an STI.   Take your antibiotics as directed. Finish them even if you start to feel better.   SEEK IMMEDIATE MEDICAL CARE IF:   Your symptoms come back.   You have a fever.   You experience any problems that may be related to the medicine you are taking.   MAKE SURE YOU:   Understand these instructions.   Will watch your condition.   Will get help right away if you are not doing well or get worse.   Document Released: 12/10/2005 Document Revised: 11/29/2011 Document Reviewed: 07/09/2011   [employer] Patient Information 2012 [employer].

## 2012-04-07 NOTE — MAU Provider Note (Signed)
History     CSN: [id number]  Arrival date & time 04/07/12  1135   None     Chief Complaint  Patient presents with  . Abdominal Pain    HPI Susan Crawford is a 26 y.o. female who presents to MAU for pelvic pain that started 2 weeks ago. The pain comes and goes, nothing makes better or worse. Vaginal discharge past week that is yellow and thick. Current sex partner x 3 years, history of Chlamydia 2 years ago. Patient states she is thinking this could be Chlamydia again. The history was provided by the patient.   Past Medical History  Diagnosis Date  . Asthma     Past Surgical History  Procedure Date  . Breast lumpectomy     Family History  Problem Relation Age of Onset  . Asthma Mother   . Hypertension Mother   . Hypertension Maternal Grandmother   . Asthma Maternal Grandmother     History  Substance Use Topics  . Smoking status: Never Smoker   . Smokeless tobacco: Not on file  . Alcohol Use: Yes     rare    OB History    Grav Para Term Preterm Abortions TAB SAB Ect Mult Living   1 1  1      1       Review of Systems  Constitutional: Negative for fever, chills, diaphoresis and fatigue.  HENT: Positive for sinus pressure. Negative for ear pain, congestion, sore throat, facial swelling, neck pain, neck stiffness and dental problem.   Eyes: Negative for photophobia, pain and discharge.  Respiratory: Negative for cough, chest tightness and wheezing.   Cardiovascular: Negative.   Gastrointestinal: Positive for abdominal pain. Negative for nausea, vomiting, diarrhea, constipation and abdominal distention.  Genitourinary: Positive for vaginal discharge and pelvic pain. Negative for dysuria, urgency, frequency, flank pain, vaginal bleeding, difficulty urinating and vaginal pain.  Musculoskeletal: Negative for myalgias, back pain and gait problem.  Skin: Negative for color change and rash.  Neurological: Negative for dizziness, speech difficulty, weakness,  light-headedness, numbness and headaches.  Psychiatric/Behavioral: Negative for confusion and agitation. The patient is not nervous/anxious.     Allergies  Review of patient's allergies indicates no known allergies.  Home Medications  No current outpatient prescriptions on file.  BP 129/79  Pulse 98  Temp(Src) 97.6 F (36.4 C) (Oral)  Resp 18  Ht 5\' 5"  (1.651 m)  Wt 177 lb 12.8 oz (80.65 kg)  BMI 29.59 kg/m2  SpO2 100%  LMP 03/18/2012  Physical Exam  Nursing note and vitals reviewed. Constitutional: She is oriented to person, place, and time. She appears well-developed and well-nourished.  HENT:  Head: Normocephalic.  Eyes: EOM are normal.  Neck: Neck supple.  Cardiovascular: Normal rate.   Pulmonary/Chest: Effort normal.  Abdominal: Soft. There is no tenderness.  Genitourinary:       External genitalia without lesions. Frothy discharge vaginal vault. Cervix inflamed, strawberry appearance, no CMT, no Adnexal tenderness, uterus without palpable enlargement.  Musculoskeletal: Normal range of motion.  Neurological: She is alert and oriented to person, place, and time. No cranial nerve deficit.  Skin: Skin is warm and dry.  Psychiatric: She has a normal mood and affect. Her behavior is normal. Judgment and thought content normal.   Results for orders placed during the hospital encounter of 04/07/12 (from the past 24 hour(s))  URINALYSIS, ROUTINE W REFLEX MICROSCOPIC     Status: Normal   Collection Time   04/07/12 11:55 AM  Component Value Range   Color, Urine YELLOW  YELLOW    APPearance CLEAR  CLEAR    Specific Gravity, Urine 1.010  1.005 - 1.030    pH 7.0  5.0 - 8.0    Glucose, UA NEGATIVE  NEGATIVE (mg/dL)   Hgb urine dipstick NEGATIVE  NEGATIVE    Bilirubin Urine NEGATIVE  NEGATIVE    Ketones, ur NEGATIVE  NEGATIVE (mg/dL)   Protein, ur NEGATIVE  NEGATIVE (mg/dL)   Urobilinogen, UA 0.2  0.0 - 1.0 (mg/dL)   Nitrite NEGATIVE  NEGATIVE    Leukocytes, UA NEGATIVE   NEGATIVE   POCT PREGNANCY, URINE     Status: Normal   Collection Time   04/07/12 12:09 PM      Component Value Range   Preg Test, Ur NEGATIVE  NEGATIVE   WET PREP, GENITAL     Status: Abnormal   Collection Time   04/07/12  3:15 PM      Component Value Range   Yeast Wet Prep HPF POC NONE SEEN  NONE SEEN    Trich, Wet Prep NONE SEEN  NONE SEEN    Clue Cells Wet Prep HPF POC NONE SEEN  NONE SEEN    WBC, Wet Prep HPF POC FEW (*) NONE SEEN    Assessment: Cervicitis   Vaginal discharge  Plan:  Rocephin 250 mg IM   Zithromax 1 gram po   Rx flagyl   Follow up with PCP   Return here as needed. ED Course  Procedures    MDM

## 2012-04-07 NOTE — MAU Note (Signed)
Patient states she has been having lower abdominal pain for about 2 weeks. Has a slight yellow discharge with no odor.

## 2012-04-08 LAB — GC/CHLAMYDIA PROBE AMP, GENITAL: GC Probe Amp, Genital: NEGATIVE

## 2012-04-09 NOTE — MAU Provider Note (Signed)
Agree with above note.  Panda Crossin 04/09/2012 7:12 AM   

## 2012-04-26 ENCOUNTER — Encounter (HOSPITAL_COMMUNITY): Payer: Self-pay | Admitting: *Deleted

## 2012-04-26 ENCOUNTER — Emergency Department (HOSPITAL_COMMUNITY)
Admission: EM | Admit: 2012-04-26 | Discharge: 2012-04-26 | Disposition: A | Discharge status: Home / Self Care | Payer: Medicaid Other | Attending: Emergency Medicine | Admitting: Emergency Medicine

## 2012-04-26 DIAGNOSIS — M542 Cervicalgia: Secondary | ICD-10-CM | POA: Insufficient documentation

## 2012-04-26 DIAGNOSIS — S139XXA Sprain of joints and ligaments of unspecified parts of neck, initial encounter: Secondary | ICD-10-CM | POA: Insufficient documentation

## 2012-04-26 DIAGNOSIS — J45909 Unspecified asthma, uncomplicated: Secondary | ICD-10-CM | POA: Insufficient documentation

## 2012-04-26 DIAGNOSIS — S161XXA Strain of muscle, fascia and tendon at neck level, initial encounter: Secondary | ICD-10-CM

## 2012-04-26 MED ORDER — CYCLOBENZAPRINE HCL 10 MG PO TABS
5.0000 mg | ORAL_TABLET | Freq: Once | ORAL | Status: AC
  Administered 2012-04-26: 5 mg via ORAL
  Filled 2012-04-26: qty 1

## 2012-04-26 MED ORDER — CYCLOBENZAPRINE HCL 5 MG PO TABS: 5.0000 mg | ORAL_TABLET | Freq: Once | ORAL | Status: AC

## 2012-04-26 MED ORDER — IBUPROFEN 800 MG PO TABS
800.0000 mg | ORAL_TABLET | Freq: Once | ORAL | Status: AC
  Administered 2012-04-26: 800 mg via ORAL
  Filled 2012-04-26: qty 1

## 2012-04-26 MED ORDER — IBUPROFEN 800 MG PO TABS: 800.0000 mg | ORAL_TABLET | Freq: Once | ORAL | Status: AC
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
## 2012-04-26 NOTE — Discharge Instructions (Signed)
Cervical Sprain  A cervical sprain is when the ligaments in the neck stretch or tear. The ligaments are the tissues that hold the neck bones in place.  HOME CARE    Put ice on the injured area.   Put ice in a plastic bag.   Place a towel between your skin and the bag.   Leave the ice on for 15 to 20 minutes, 3 to 4 times a day.   Only take medicine as told by your doctor.   Keep all doctor visits as told.   Keep all physical therapy visits as told.   If your doctor gives you a neck collar, wear it as told.   Do not drive while wearing a neck collar.   Adjust your work station so that you have good posture while you work.   Avoid positions and activities that make your problems worse.   Warm up and stretch before being active.  GET HELP RIGHT AWAY IF:    You are bleeding or your stomach is upset.   You have an allergic reaction to your medicine.   Your problems (symptoms) get worse.   You develop new problems.   You lose feeling (numbness) or you cannot move (paralysis) any part of your body.   You have tingling or weakness in any part of your body.   Your pain is not controlled with medicine.   You cannot take less pain medicine over time as planned.   Your activity level does not improve as expected.  MAKE SURE YOU:    Understand these instructions.   Will watch your condition.   Will get help right away if you are not doing well or get worse.  Document Released: 05/28/2008 Document Revised: 11/29/2011 Document Reviewed: 09/13/2011  [employer] Patient Information 2012 [employer]

## 2012-04-26 NOTE — ED Provider Notes (Signed)
Medical screening examination/treatment/procedure(s) were performed by non-physician practitioner and as supervising physician I was immediately available for consultation/collaboration.  Tyheem Boughner R. Mirai Greenwood, MD 04/26/12 2336 

## 2012-09-03 ENCOUNTER — Emergency Department (HOSPITAL_COMMUNITY)
Admission: EM | Admit: 2012-09-03 | Discharge: 2012-09-03 | Disposition: A | Discharge status: Home / Self Care | Payer: Medicaid Other | Attending: Emergency Medicine | Admitting: Emergency Medicine

## 2012-09-03 ENCOUNTER — Encounter (HOSPITAL_COMMUNITY): Payer: Self-pay | Admitting: *Deleted

## 2012-09-03 DIAGNOSIS — N898 Other specified noninflammatory disorders of vagina: Secondary | ICD-10-CM

## 2012-09-03 DIAGNOSIS — R109 Unspecified abdominal pain: Secondary | ICD-10-CM

## 2012-09-03 LAB — URINE MICROSCOPIC-ADD ON

## 2012-09-03 LAB — URINALYSIS, ROUTINE W REFLEX MICROSCOPIC
Glucose, UA: NEGATIVE mg/dL
Hgb urine dipstick: NEGATIVE
Specific Gravity, Urine: 1.029 (ref 1.005–1.030)
Urobilinogen, UA: 1 mg/dL (ref 0.0–1.0)

## 2012-09-03 LAB — WET PREP, GENITAL
Trich, Wet Prep: NONE SEEN
Yeast Wet Prep HPF POC: NONE SEEN

## 2012-09-03 MED ORDER — OXYCODONE-ACETAMINOPHEN 5-325 MG PO TABS
2.0000 | ORAL_TABLET | Freq: Once | ORAL | Status: DC
  Filled 2012-09-03: qty 2

## 2012-09-03 MED ORDER — AZITHROMYCIN 1 G PO PACK
1.0000 g | PACK | Freq: Once | ORAL | Status: AC
  Administered 2012-09-03: 1 g via ORAL
  Filled 2012-09-03: qty 1

## 2012-09-03 MED ORDER — IBUPROFEN 800 MG PO TABS
800.0000 mg | ORAL_TABLET | Freq: Once | ORAL | Status: AC
  Administered 2012-09-03: 800 mg via ORAL
  Filled 2012-09-03: qty 1

## 2012-09-03 MED ORDER — CEFTRIAXONE SODIUM 250 MG IJ SOLR
250.0000 mg | Freq: Once | INTRAMUSCULAR | Status: AC
  Administered 2012-09-03: 250 mg via INTRAMUSCULAR
  Filled 2012-09-03: qty 250

## 2012-09-03 MED ORDER — LIDOCAINE HCL 1 % IJ SOLN
INTRAMUSCULAR | Status: AC
  Administered 2012-09-03: 0.6 mL
  Filled 2012-09-03: qty 20

## 2012-09-03 NOTE — ED Provider Notes (Signed)
Medical screening examination/treatment/procedure(s) were performed by non-physician practitioner and as supervising physician I was immediately available for consultation/collaboration.  Toy Baker, MD 09/03/12 [phone]

## 2012-09-03 NOTE — ED Provider Notes (Signed)
History     CSN: [id number]  Arrival date & time 09/03/12  4098   First MD Initiated Contact with Patient 09/03/12 [phone]      Chief Complaint  Patient presents with  . Abdominal Pain  . Vaginal Discharge    (Consider location/radiation/quality/duration/timing/severity/associated sxs/prior treatment) HPI Comments: Susan Crawford 26 y.o. female   The chief complaint is: Patient presents with:   Abdominal Pain   Vaginal Discharge   The patient has medical history significant for:   Past Medical History:   Asthma                                                      Patient presents with lower abdominal pain rated 7/10 and non-foul smelling yellow discharge X 1 week. Patient states that her fiance is in the [employer] and she beliefs she has "someone on the side".  She does not use a barrier method on contraception. Denies fever or chills. Denies NVD. Denies dysuria, urgency, frequency, or hematuria. Denies dysparaunia. LMP was last week     Patient is a 26 y.o. female presenting with vaginal discharge. The history is provided by the patient.  Vaginal Discharge Associated symptoms include abdominal pain. Pertinent negatives include no chills, fever, nausea or vomiting.    Past Medical History  Diagnosis Date  . Asthma     Past Surgical History  Procedure Date  . Breast lumpectomy     Family History  Problem Relation Age of Onset  . Asthma Mother   . Hypertension Mother   . Hypertension Maternal Grandmother   . Asthma Maternal Grandmother     History  Substance Use Topics  . Smoking status: Never Smoker   . Smokeless tobacco: Not on file  . Alcohol Use: Yes     rare    OB History    Grav Para Term Preterm Abortions TAB SAB Ect Mult Living   1 1  1      1       Review of Systems  Constitutional: Negative for fever and chills.  Gastrointestinal: Positive for abdominal pain. Negative for nausea, vomiting and diarrhea.  Genitourinary: Positive for vaginal  discharge. Negative for dysuria, urgency, frequency and dyspareunia.  All other systems reviewed and are negative.    Allergies  Review of patient's allergies indicates no known allergies.  Home Medications   Current Outpatient Rx  Name Route Sig Dispense Refill  . GOODY HEADACHE PO Oral Take 1 packet by mouth every 6 (six) hours as needed. For headache    . CETIRIZINE-PSEUDOEPHEDRINE ER 5-120 MG PO TB12 Oral Take 1 tablet by mouth daily.    . ADULT MULTIVITAMIN W/MINERALS CH Oral Take 1 tablet by mouth daily.      BP 131/84  Pulse 96  Temp 98.6 F (37 C) (Oral)  Resp 18  SpO2 100%  LMP 08/19/2012  Physical Exam  Nursing note and vitals reviewed. Constitutional: She appears well-developed and well-nourished. No distress.  HENT:  Head: Normocephalic and atraumatic.  Mouth/Throat: Oropharynx is clear and moist.  Eyes: Conjunctivae normal and EOM are normal. No scleral icterus.  Neck: Normal range of motion. Neck supple.  Cardiovascular: Normal rate, regular rhythm and normal heart sounds.   Pulmonary/Chest: Effort normal and breath sounds normal.  Abdominal: Soft. Bowel sounds are normal. There is no  tenderness.  Genitourinary: Uterus normal. Cervix exhibits motion tenderness, discharge and friability. Right adnexum displays no mass, no tenderness and no fullness. Left adnexum displays no mass, no tenderness and no fullness. Vaginal discharge found.    Neurological: She is alert.  Skin: Skin is warm and dry.    ED Course  Procedures (including critical care time)  Labs Reviewed  URINALYSIS, ROUTINE W REFLEX MICROSCOPIC - Abnormal; Notable for the following:    Leukocytes, UA SMALL (*)     All other components within normal limits  WET PREP, GENITAL - Abnormal; Notable for the following:    WBC, Wet Prep HPF POC FEW (*)     All other components within normal limits  URINE MICROSCOPIC-ADD ON - Abnormal; Notable for the following:    Squamous Epithelial / LPF FEW (*)      Bacteria, UA FEW (*)     All other components within normal limits  POCT PREGNANCY, URINE  GC/CHLAMYDIA PROBE AMP, GENITAL   Results for orders placed during the hospital encounter of 09/03/12  URINALYSIS, ROUTINE W REFLEX MICROSCOPIC      Component Value Range   Color, Urine YELLOW  YELLOW   APPearance CLEAR  CLEAR   Specific Gravity, Urine 1.029  1.005 - 1.030   pH 6.5  5.0 - 8.0   Glucose, UA NEGATIVE  NEGATIVE mg/dL   Hgb urine dipstick NEGATIVE  NEGATIVE   Bilirubin Urine NEGATIVE  NEGATIVE   Ketones, ur NEGATIVE  NEGATIVE mg/dL   Protein, ur NEGATIVE  NEGATIVE mg/dL   Urobilinogen, UA 1.0  0.0 - 1.0 mg/dL   Nitrite NEGATIVE  NEGATIVE   Leukocytes, UA SMALL (*) NEGATIVE  WET PREP, GENITAL      Component Value Range   Yeast Wet Prep HPF POC NONE SEEN  NONE SEEN   Trich, Wet Prep NONE SEEN  NONE SEEN   Clue Cells Wet Prep HPF POC NONE SEEN  NONE SEEN   WBC, Wet Prep HPF POC FEW (*) NONE SEEN  POCT PREGNANCY, URINE      Component Value Range   Preg Test, Ur NEGATIVE  NEGATIVE  URINE MICROSCOPIC-ADD ON      Component Value Range   Squamous Epithelial / LPF FEW (*) RARE   WBC, UA 0-2  <3 WBC/hpf   Bacteria, UA FEW (*) RARE   Urine-Other MUCOUS PRESENT      No results found.   1. Vaginal Discharge   2. Abdominal pain       MDM  Patient presented with vaginal discharge and abdominal pain X 1 week. Pain rated 7/10. UPT: negative, UA: small leukocytes and few bacteria. Wet prep: WBC's. Patient give Ibuprofen for pain. Patient treated empircally for GC/chlamydia in ED with rocephin and Azithromycin. Patient informed that she will be called if positive, and responsible for informing her partners. No red flags for tuboovarian abscess, ovarian torsion, or Fitz-Hugh-Curtis. Return precautions given.        Susan Casino, PA-C 09/03/12 1145

## 2012-09-03 NOTE — ED Notes (Signed)
Pt reports lower abd pain, yellow, thick vaginal d/c x1 week. Denies n/v/d.

## 2012-09-04 LAB — GC/CHLAMYDIA PROBE AMP, GENITAL: GC Probe Amp, Genital: NEGATIVE

## 2012-10-03 ENCOUNTER — Emergency Department (HOSPITAL_COMMUNITY)
Admission: EM | Admit: 2012-10-03 | Discharge: 2012-10-03 | Disposition: A | Discharge status: Home / Self Care | Payer: Medicaid Other | Attending: Emergency Medicine | Admitting: Emergency Medicine

## 2012-10-03 ENCOUNTER — Encounter (HOSPITAL_COMMUNITY): Payer: Self-pay | Admitting: Emergency Medicine

## 2012-10-03 DIAGNOSIS — J45909 Unspecified asthma, uncomplicated: Secondary | ICD-10-CM | POA: Insufficient documentation

## 2012-10-03 DIAGNOSIS — R109 Unspecified abdominal pain: Secondary | ICD-10-CM | POA: Insufficient documentation

## 2012-10-03 DIAGNOSIS — N949 Unspecified condition associated with female genital organs and menstrual cycle: Secondary | ICD-10-CM | POA: Insufficient documentation

## 2012-10-03 LAB — URINALYSIS, MICROSCOPIC ONLY
Hgb urine dipstick: NEGATIVE
Ketones, ur: NEGATIVE mg/dL
Leukocytes, UA: NEGATIVE
Protein, ur: NEGATIVE mg/dL
Urobilinogen, UA: 1 mg/dL (ref 0.0–1.0)

## 2012-10-03 LAB — WET PREP, GENITAL
Clue Cells Wet Prep HPF POC: NONE SEEN
Trich, Wet Prep: NONE SEEN

## 2012-10-03 LAB — POCT PREGNANCY, URINE: Preg Test, Ur: NEGATIVE

## 2012-10-03 NOTE — ED Provider Notes (Signed)
History     CSN: [id number]  Arrival date & time 10/03/12  0806   First MD Initiated Contact with Patient 10/03/12 0818      Chief Complaint  Patient presents with  . Abdominal Pain    (Consider location/radiation/quality/duration/timing/severity/associated sxs/prior treatment) Patient is a 26 y.o. female presenting with abdominal pain. The history is provided by the patient.  Abdominal Pain The primary symptoms of the illness include abdominal pain and vaginal discharge. The primary symptoms of the illness do not include shortness of breath, nausea, vomiting or diarrhea.  Symptoms associated with the illness do not include back pain.   patient presents with lower abdominal pain. It began 2 ago and comes and goes. No dysuria. No nausea vomiting diarrhea. No constipation. Patient states that when she was given a urine sample here she noted she had some vaginal discharge. She states she has had unprotected sex. No fevers. She was seen a month ago for similar symptoms.  Past Medical History  Diagnosis Date  . Asthma     Past Surgical History  Procedure Date  . Breast lumpectomy     Family History  Problem Relation Age of Onset  . Asthma Mother   . Hypertension Mother   . Hypertension Maternal Grandmother   . Asthma Maternal Grandmother     History  Substance Use Topics  . Smoking status: Never Smoker   . Smokeless tobacco: Not on file  . Alcohol Use: Yes     rare    OB History    Grav Para Term Preterm Abortions TAB SAB Ect Mult Living   1 1  1      1       Review of Systems  Constitutional: Negative for activity change and appetite change.  HENT: Negative for neck stiffness.   Eyes: Negative for pain.  Respiratory: Negative for chest tightness and shortness of breath.   Cardiovascular: Negative for chest pain and leg swelling.  Gastrointestinal: Positive for abdominal pain. Negative for nausea, vomiting and diarrhea.  Genitourinary: Positive for vaginal  discharge. Negative for flank pain.  Musculoskeletal: Negative for back pain.  Skin: Negative for rash.  Neurological: Negative for weakness, numbness and headaches.  Psychiatric/Behavioral: Negative for behavioral problems.    Allergies  Review of patient's allergies indicates no known allergies.  Home Medications   Current Outpatient Rx  Name Route Sig Dispense Refill  . GOODY HEADACHE PO Oral Take 1 packet by mouth every 6 (six) hours as needed. For headache    . CETIRIZINE-PSEUDOEPHEDRINE ER 5-120 MG PO TB12 Oral Take 1 tablet by mouth daily.    . ADULT MULTIVITAMIN W/MINERALS CH Oral Take 1 tablet by mouth daily.      BP 128/83  Pulse 87  Temp 98.8 F (37.1 C) (Oral)  Resp 16  SpO2 100%  LMP 09/21/2012  Physical Exam  Nursing note and vitals reviewed. Constitutional: She is oriented to person, place, and time. She appears well-developed and well-nourished.  HENT:  Head: Normocephalic and atraumatic.  Eyes: EOM are normal. Pupils are equal, round, and reactive to light.  Neck: Normal range of motion. Neck supple.  Cardiovascular: Normal rate, regular rhythm and normal heart sounds.   No murmur heard. Pulmonary/Chest: Effort normal and breath sounds normal. No respiratory distress. She has no wheezes. She has no rales.  Abdominal: Soft. Bowel sounds are normal. She exhibits no distension. There is tenderness. There is no rebound and no guarding.       Minimal suprapubic  tenderness without rebound or guarding.  Musculoskeletal: Normal range of motion.  Neurological: She is alert and oriented to person, place, and time. No cranial nerve deficit.  Skin: Skin is warm and dry.  Psychiatric: She has a normal mood and affect. Her speech is normal.   pelvic exam showed no cervical motion tenderness and minimal discharge.  ED Course  Procedures (including critical care time)  Labs Reviewed  URINALYSIS, MICROSCOPIC ONLY - Abnormal; Notable for the following:    APPearance  CLOUDY (*)     Bacteria, UA FEW (*)     All other components within normal limits  WET PREP, GENITAL - Abnormal; Notable for the following:    WBC, Wet Prep HPF POC RARE (*)     All other components within normal limits  POCT PREGNANCY, URINE  GC/CHLAMYDIA PROBE AMP, GENITAL   No results found.   1. Abdominal pain       MDM  Patient with lower abdominal pain. Minimal tenderness. Minimal vaginal discharge. Reassuring wet prep. Patient was treated month ago for STDs and had negative cultures. She'll be discharged home. I doubt appendicitis or ovarian torsion.        Juliet Rude. Rubin Payor, MD 10/03/12 [phone]

## 2012-10-03 NOTE — ED Notes (Signed)
Pt presenting to ed with c/o lower abdominal pain pt denies nausea, vomiting or diarrhea. Pt states normal bowel movement this morning.

## 2012-10-03 NOTE — ED Notes (Signed)
Pt. Has under garments are. Pt. Set up and ready for pelvic exam.

## 2012-10-26 ENCOUNTER — Inpatient Hospital Stay (HOSPITAL_COMMUNITY)

## 2012-10-26 ENCOUNTER — Inpatient Hospital Stay (HOSPITAL_COMMUNITY)
Admission: AD | Admit: 2012-10-26 | Discharge: 2012-10-26 | Disposition: A | Discharge status: Hospice | Source: Ambulatory Visit | Attending: Obstetrics & Gynecology | Admitting: Obstetrics & Gynecology

## 2012-10-26 ENCOUNTER — Encounter (HOSPITAL_COMMUNITY): Payer: Self-pay | Admitting: *Deleted

## 2012-10-26 DIAGNOSIS — O26859 Spotting complicating pregnancy, unspecified trimester: Secondary | ICD-10-CM | POA: Insufficient documentation

## 2012-10-26 LAB — CBC
Hemoglobin: 11.5 g/dL — ABNORMAL LOW (ref 12.0–15.0)
MCH: 28.3 pg (ref 26.0–34.0)
MCHC: 32.2 g/dL (ref 30.0–36.0)
MCV: 87.7 fL (ref 78.0–100.0)
RBC: 4.07 MIL/uL (ref 3.87–5.11)

## 2012-10-26 LAB — ABO/RH: ABO/RH(D): O POS

## 2012-10-26 MED ORDER — PRENATAL MULTIVITAMIN + DHA 28-0.8 & 200 MG PO MISC: 1.0000 | Freq: Every day | ORAL | Status: DC

## 2012-10-26 NOTE — MAU Note (Signed)
Pt had a normal cycle on Sep 29-4 Oct then she had another normal 5 day cycle starting Oct 19th.  No pain.

## 2012-10-26 NOTE — MAU Provider Note (Signed)
History     CSN: [id number]  Arrival date and time: 10/26/12 1521   None     Chief Complaint  Patient presents with  . Early pregnant-spotting    HPI 26 y.o. G2P0101 at Unknown EGA with light bleeding starting yesterday. Patient's last menstrual period was 10/11/2012. Had period prior to that on 09/21/12. No pain.    Past Medical History  Diagnosis Date  . Asthma     Past Surgical History  Procedure Date  . Breast lumpectomy     Family History  Problem Relation Age of Onset  . Asthma Mother   . Hypertension Mother   . Hypertension Maternal Grandmother   . Asthma Maternal Grandmother     History  Substance Use Topics  . Smoking status: Never Smoker   . Smokeless tobacco: Not on file  . Alcohol Use: Yes     Comment: rare    Allergies: No Known Allergies  Prescriptions prior to admission  Medication Sig Dispense Refill  . Prenatal Vit-Fe Fumarate-FA (PRENATAL MULTIVITAMIN) TABS Take 1 tablet by mouth daily.      . [DISCONTINUED] Aspirin-Acetaminophen-Caffeine (GOODY HEADACHE PO) Take 1 packet by mouth every 6 (six) hours as needed. For headache      . [DISCONTINUED] cetirizine-pseudoephedrine (ZYRTEC-D) 5-120 MG per tablet Take 1 tablet by mouth daily.        Review of Systems  Constitutional: Negative.   Respiratory: Negative.   Cardiovascular: Negative.   Gastrointestinal: Negative for nausea, vomiting, abdominal pain, diarrhea and constipation.  Genitourinary: Negative for dysuria, urgency, frequency, hematuria and flank pain.       Positive for spotting   Musculoskeletal: Negative.   Neurological: Negative.   Psychiatric/Behavioral: Negative.    Physical Exam   Blood pressure 125/78, pulse 102, temperature 97.9 F (36.6 C), temperature source Oral, resp. rate 16, height 5\' 5"  (1.651 m), weight 187 lb (84.823 kg), last menstrual period 10/11/2012.  Physical Exam  Nursing note and vitals reviewed. Constitutional: She is oriented to person, place,  and time. She appears well-developed and well-nourished. No distress.  Cardiovascular: Normal rate.   Respiratory: Effort normal.  GI: Soft. There is no tenderness.  Genitourinary: There is no tenderness, lesion or injury on the right labia. There is no tenderness, lesion or injury on the left labia. Uterus is not tender. Cervix exhibits no motion tenderness. Right adnexum displays no mass, no tenderness and no fullness. Left adnexum displays no mass, no tenderness and no fullness. No bleeding (no bleeding noted on glove with bimanual exam) around the vagina. No vaginal discharge found.       Declines cultures and wet prep today - had on 10/11 at prior MAU visit  Musculoskeletal: Normal range of motion.  Neurological: She is alert and oriented to person, place, and time.  Skin: Skin is warm and dry.  Psychiatric: She has a normal mood and affect.    MAU Course  Procedures Results for orders placed during the hospital encounter of 10/26/12 (from the past 24 hour(s))  POCT PREGNANCY, URINE     Status: Abnormal   Collection Time   10/26/12  3:46 PM      Component Value Range   Preg Test, Ur POSITIVE (*) NEGATIVE  CBC     Status: Abnormal   Collection Time   10/26/12  4:25 PM      Component Value Range   WBC 9.2  4.0 - 10.5 K/uL   RBC 4.07  3.87 - 5.11 MIL/uL  Hemoglobin 11.5 (*) 12.0 - 15.0 g/dL   HCT 96.0 (*) 45.4 - 09.8 %   MCV 87.7  78.0 - 100.0 fL   MCH 28.3  26.0 - 34.0 pg   MCHC 32.2  30.0 - 36.0 g/dL   RDW 11.9  14.7 - 82.9 %   Platelets 257  150 - 400 K/uL  ABO/RH     Status: Normal   Collection Time   10/26/12  4:25 PM      Component Value Range   ABO/RH(D) O POS    HCG, QUANTITATIVE, PREGNANCY     Status: Abnormal   Collection Time   10/26/12  4:25 PM      Component Value Range   hCG, Beta Chain, Quant, S 4761 (*) <5 mIU/mL    US Ob Comp Less 14 Wks  10/26/2012  *RADIOLOGY REPORT*  Clinical Data: Vaginal bleeding.  Evaluate early pregnancy.  OBSTETRIC <14 WK [place] AND  TRANSVAGINAL OB US  Technique:  Both transabdominal and transvaginal ultrasound examinations were performed for complete evaluation of the gestation as well as the maternal uterus, adnexal regions, and pelvic cul-de-sac.  Transvaginal technique was performed to assess early pregnancy.  Comparison:  None.  Intrauterine gestational sac:  Visualized/normal in shape. Yolk sac: Present Embryo: None Cardiac Activity: None Heart Rate: N/A bpm  MSD: 6.9 mm  5 w 2 d             [place] EDC: 06/26/2013  Maternal uterus/adnexae: No subchorionic hemorrhage. Normal right ovary. Normal left ovary. No free pelvic fluid.  IMPRESSION:  1.  Intrauterine gestational sac estimated at 5 weeks and 2 days gestation.  A small yolk sac is present but no embryo is visualized yet. 2.  No subchorionic hemorrhage. 3.  Normal ovaries.   Original Report Authenticated By: Rudie Meyer, M.D.    US Ob Transvaginal  10/26/2012  *RADIOLOGY REPORT*  Clinical Data: Vaginal bleeding.  Evaluate early pregnancy.  OBSTETRIC <14 WK [place] AND TRANSVAGINAL OB US  Technique:  Both transabdominal and transvaginal ultrasound examinations were performed for complete evaluation of the gestation as well as the maternal uterus, adnexal regions, and pelvic cul-de-sac.  Transvaginal technique was performed to assess early pregnancy.  Comparison:  None.  Intrauterine gestational sac:  Visualized/normal in shape. Yolk sac: Present Embryo: None Cardiac Activity: None Heart Rate: N/A bpm  MSD: 6.9 mm  5 w 2 d             [place] EDC: 06/26/2013  Maternal uterus/adnexae: No subchorionic hemorrhage. Normal right ovary. Normal left ovary. No free pelvic fluid.  IMPRESSION:  1.  Intrauterine gestational sac estimated at 5 weeks and 2 days gestation.  A small yolk sac is present but no embryo is visualized yet. 2.  No subchorionic hemorrhage. 3.  Normal ovaries.   Original Report Authenticated By: Rudie Meyer, M.D.    Assessment and Plan   1. Spotting in pregnancy         Medication List     As of 10/26/2012  5:25 PM    START taking these medications         PRENATAL MULTIVITAMIN + DHA 28-0.8 & 200 MG Misc   Take 1 tablet by mouth daily.      CONTINUE taking these medications         prenatal multivitamin Tabs      STOP taking these medications         cetirizine-pseudoephedrine 5-120 MG per tablet   Commonly  known as: ZYRTEC-D      GOODY HEADACHE PO          Where to get your medications    These are the prescriptions that you need to pick up. We sent them to a specific pharmacy, so you will need to go there to get them.   [id number] - [place] ([place]), [place] - [address]    [address]  ([address]) [place] [address]    Phone: [phone]        PRENATAL MULTIVITAMIN + DHA 28-0.8 & 200 MG Misc            Follow-up Information    Follow up with [facility] [place].   Contact information:   [address] [place] [address] [phone]      Follow up with [facility]. (start prenatal care as soon as possible)    Contact information:   [address] [place] [address] [phone]           Fidelia Cathers 10/26/2012, 5:25 PM

## 2012-10-26 NOTE — MAU Note (Signed)
Pt states LMP-10/11/2012, had +upt at home, awoke today and wiped, then noted blood. Wearing panty liner. Spotting like beginning of cycle. Denies pain, denies abnormal vaginal discharge.

## 2012-11-13 ENCOUNTER — Inpatient Hospital Stay (HOSPITAL_COMMUNITY)

## 2012-11-13 ENCOUNTER — Inpatient Hospital Stay (HOSPITAL_COMMUNITY)
Admission: AD | Admit: 2012-11-13 | Discharge: 2012-11-13 | Disposition: A | Discharge status: Home / Self Care | Source: Ambulatory Visit | Attending: Obstetrics & Gynecology | Admitting: Obstetrics & Gynecology

## 2012-11-13 ENCOUNTER — Encounter (HOSPITAL_COMMUNITY): Payer: Self-pay | Admitting: *Deleted

## 2012-11-13 DIAGNOSIS — O210 Mild hyperemesis gravidarum: Secondary | ICD-10-CM | POA: Insufficient documentation

## 2012-11-13 DIAGNOSIS — O4690 Antepartum hemorrhage, unspecified, unspecified trimester: Secondary | ICD-10-CM

## 2012-11-13 DIAGNOSIS — O219 Vomiting of pregnancy, unspecified: Secondary | ICD-10-CM

## 2012-11-13 DIAGNOSIS — O26859 Spotting complicating pregnancy, unspecified trimester: Secondary | ICD-10-CM | POA: Insufficient documentation

## 2012-11-13 LAB — URINE MICROSCOPIC-ADD ON

## 2012-11-13 LAB — URINALYSIS, ROUTINE W REFLEX MICROSCOPIC
Nitrite: NEGATIVE
Specific Gravity, Urine: 1.02 (ref 1.005–1.030)
Urobilinogen, UA: 0.2 mg/dL (ref 0.0–1.0)
pH: 7.5 (ref 5.0–8.0)

## 2012-11-13 LAB — WET PREP, GENITAL
Clue Cells Wet Prep HPF POC: NONE SEEN
Yeast Wet Prep HPF POC: NONE SEEN

## 2012-11-13 MED ORDER — ONDANSETRON 8 MG PO TBDP
8.0000 mg | ORAL_TABLET | Freq: Once | ORAL | Status: AC
  Administered 2012-11-13: 8 mg via ORAL
  Filled 2012-11-13: qty 1

## 2012-11-13 MED ORDER — ONDANSETRON 8 MG PO TBDP
8.0000 mg | ORAL_TABLET | Freq: Three times a day (TID) | ORAL | Status: DC | PRN
Start: 2012-11-13 — End: 2012-11-25

## 2012-11-13 NOTE — MAU Note (Signed)
Pt C/O nausea for entire pregnancy but has had excessive vomitting for past 3 days.  Pt also C/O bleeding, started this morning, went to [facility] & there was blood in toilet & on tissue.  Denies pain.  Pt has appt with [facility] on 12/13.

## 2012-11-13 NOTE — MAU Provider Note (Signed)
History     CSN: [id number]  Arrival date and time: 11/13/12 4098   First Provider Initiated Contact with Patient 11/13/12 [phone]      Chief Complaint  Patient presents with  . Vaginal Bleeding  . Emesis During Pregnancy   HPI Susan Crawford is a 26 y.o. G85P0101 female with an Estimated Date of Delivery: 06/26/13 at [redacted]w[redacted]d gestation determined by [place] on 10/26/12 who presents with 3 days of nausea and vomiting and one day of light vaginal bleeding. She is vomiting every couple of hours, and feels nauseas all the time. Reports yellow-greenish foamy emesis, no blood. Can not keep solids or liquids down. Also notes headaches for 6 days. She has not taken medication for either problem as she was unsure what she could take. Vaginal bleeding began this morning, noticed it after wiping after using the bathroom. No spotting on her clothing. Denies any abdominal or pelvic pain.  History of nausea/vomiting with first pregnancy, used Bonine during that time for nausea.   OB History    Grav Para Term Preterm Abortions TAB SAB Ect Mult Living   2 1  1      1       Past Medical History  Diagnosis Date  . Asthma     Past Surgical History  Procedure Date  . Breast lumpectomy     Family History  Problem Relation Age of Onset  . Asthma Mother   . Hypertension Mother   . Hypertension Maternal Grandmother   . Asthma Maternal Grandmother     History  Substance Use Topics  . Smoking status: Never Smoker   . Smokeless tobacco: Not on file  . Alcohol Use: Yes     Comment: rare    Allergies: No Known Allergies  Prescriptions prior to admission  Medication Sig Dispense Refill  . Prenatal Vit-Fe Fumarate-FA (PRENATAL MULTIVITAMIN) TABS Take 1 tablet by mouth daily.        Review of Systems  Constitutional: Negative for fever and chills.  Eyes: Negative for blurred vision and double vision.  Respiratory: Negative for shortness of breath.   Gastrointestinal: Positive for heartburn, nausea  and vomiting. Negative for abdominal pain.  Genitourinary: Negative for dysuria.       Light vaginal bleeding  Neurological: Positive for headaches.   Physical Exam   Blood pressure 130/79, pulse 89, temperature 98.3 F (36.8 C), temperature source Oral, resp. rate 18, height 5\' 5"  (1.651 m), weight 84.369 kg (186 lb), last menstrual period 10/11/2012.  Physical Exam  Nursing note and vitals reviewed. Constitutional: She is oriented to person, place, and time. She appears well-developed and well-nourished. No distress.  Cardiovascular: Normal rate, regular rhythm and normal heart sounds.   Respiratory: Effort normal and breath sounds normal. No respiratory distress.  GI: Soft. There is tenderness (slight upper abdominal tenderness). There is no rebound and no guarding.  Genitourinary: There is no rash, tenderness or lesion on the right labia. There is no rash, tenderness or lesion on the left labia. Uterus is not enlarged. Cervix exhibits no motion tenderness. Right adnexum displays no mass and no tenderness. Left adnexum displays no mass and no tenderness. There is bleeding (small amount) around the vagina. Vaginal discharge (white, milky) found.       Erythema noted around the cervical os. Cervix closed. Uterus consistent with dates.  Neurological: She is alert and oriented to person, place, and time.  Skin: Skin is warm and dry.  Psychiatric: She has a normal mood  and affect.   Results for orders placed during the hospital encounter of 11/13/12 (from the past 24 hour(s))  URINALYSIS, ROUTINE W REFLEX MICROSCOPIC     Status: Abnormal   Collection Time   11/13/12  8:50 AM      Component Value Range   Color, Urine YELLOW  YELLOW   APPearance CLEAR  CLEAR   Specific Gravity, Urine 1.020  1.005 - 1.030   pH 7.5  5.0 - 8.0   Glucose, UA NEGATIVE  NEGATIVE mg/dL   Hgb urine dipstick TRACE (*) NEGATIVE   Bilirubin Urine NEGATIVE  NEGATIVE   Ketones, ur NEGATIVE  NEGATIVE mg/dL    Protein, ur NEGATIVE  NEGATIVE mg/dL   Urobilinogen, UA 0.2  0.0 - 1.0 mg/dL   Nitrite NEGATIVE  NEGATIVE   Leukocytes, UA NEGATIVE  NEGATIVE  URINE MICROSCOPIC-ADD ON     Status: Abnormal   Collection Time   11/13/12  8:50 AM      Component Value Range   Squamous Epithelial / LPF MANY (*) RARE   RBC / HPF 0-2  <3 RBC/hpf  WET PREP, GENITAL     Status: Abnormal   Collection Time   11/13/12 10:50 AM      Component Value Range   Yeast Wet Prep HPF POC NONE SEEN  NONE SEEN   Trich, Wet Prep NONE SEEN  NONE SEEN   Clue Cells Wet Prep HPF POC NONE SEEN  NONE SEEN   WBC, Wet Prep HPF POC FEW (*) NONE SEEN     MAU Course  Procedures 1. Transvaginal OB US today shows a single IUP of gestational age [redacted]wk6d with a small corpus luteum noted on the right side. Fetal heart rate 148.  2. Speculum Exam 3. Bimanual Exam  Exams performed with Kerrie Buffalo, NP.  Assessment and Plan  26 y.o. G80P0101 female at [redacted]w[redacted]d EGA with nausea and vomiting in pregnancy and vaginal spotting.  Plan: Zofran for nausea  BRAT diet  Tylenol for headaches, PRN.  Cultures for GC, Chlamydia pending Marnee Spring 11/13/2012, 9:56 AM   I have examined this patient with the student and agree with the assessment. I have assisted with the plan of care.

## 2012-11-23 NOTE — MAU Provider Note (Signed)
Attestation of Attending Supervision of Advanced Practitioner (CNM/NP): Evaluation and management procedures were performed by the Advanced Practitioner under my supervision and collaboration. I have reviewed the Advanced Practitioner's note and chart, and I agree with the management and plan.  Peaches Vanoverbeke H. 5:05 PM   

## 2012-11-25 ENCOUNTER — Encounter (HOSPITAL_COMMUNITY): Payer: Self-pay | Admitting: *Deleted

## 2012-11-25 ENCOUNTER — Inpatient Hospital Stay (HOSPITAL_COMMUNITY)
Admission: AD | Admit: 2012-11-25 | Discharge: 2012-11-25 | Disposition: A | Discharge status: Home / Self Care | Source: Ambulatory Visit | Attending: Obstetrics & Gynecology | Admitting: Obstetrics & Gynecology

## 2012-11-25 DIAGNOSIS — O210 Mild hyperemesis gravidarum: Secondary | ICD-10-CM | POA: Insufficient documentation

## 2012-11-25 DIAGNOSIS — K5900 Constipation, unspecified: Secondary | ICD-10-CM | POA: Insufficient documentation

## 2012-11-25 DIAGNOSIS — O99891 Other specified diseases and conditions complicating pregnancy: Secondary | ICD-10-CM | POA: Insufficient documentation

## 2012-11-25 DIAGNOSIS — O99619 Diseases of the digestive system complicating pregnancy, unspecified trimester: Secondary | ICD-10-CM

## 2012-11-25 DIAGNOSIS — O219 Vomiting of pregnancy, unspecified: Secondary | ICD-10-CM

## 2012-11-25 LAB — URINALYSIS, ROUTINE W REFLEX MICROSCOPIC
Glucose, UA: NEGATIVE mg/dL
Leukocytes, UA: NEGATIVE
pH: 6 (ref 5.0–8.0)

## 2012-11-25 MED ORDER — PROMETHAZINE HCL 12.5 MG PO TABS: 12.5000 mg | ORAL_TABLET | Freq: Four times a day (QID) | ORAL | Status: DC | PRN

## 2012-11-25 MED ORDER — METOCLOPRAMIDE HCL 5 MG PO TABS: 5.0000 mg | ORAL_TABLET | Freq: Four times a day (QID) | ORAL | Status: DC

## 2012-11-25 MED ORDER — FLEET ENEMA 7-19 GM/118ML RE ENEM
1.0000 | ENEMA | Freq: Once | RECTAL | Status: AC
  Administered 2012-11-25: 1 via RECTAL

## 2012-11-25 NOTE — MAU Note (Signed)
Hasn't been able to have a bm (2-3days).  Feeling pressure, but can't go, hasn't been able to eat or drink anything. Now having abd pain. Had been taking zofran, no longer working.   Had this with first, but this is much worse.

## 2012-11-25 NOTE — MAU Provider Note (Signed)
History     CSN: [id number]  Arrival date and time: 11/25/12 1249   First Provider Initiated Contact with Patient 11/25/12 1528      Chief Complaint  Patient presents with  . Morning Sickness  . Constipation   HPI Comments: Susan Crawford is a 26 y.o. G65P0101 female with an estimated gestational age of [redacted]w[redacted]d estimated by ultrasound on 10/26/12 who presents today with nausea and vomiting throughout the pregnancy and constipation. She was seen here on 11/21 complaining of severe nausea and vomiting and was given Zofran. Has been taking it regularly until yesterday when she only took 1 tablet. [place] Spaniel it made a little bit of a difference, but she still has lots of nausea and vomiting. Tried to eat on Thanksgiving, but threw it all up. Has not been able to eat a meal since then. Cannot tolerate liquids either. Also reports all over stomach pain that comes and goes. Her constipation has been worse the past few days, but she has had some throughout the pregnancy. Was unsure of what to take. She reports passing small bowel movements that are not regular for her, they are hard and small balls. Endorses feeling tired and weak and having headaches. Denies any vaginal bleeding, fever or chills. Used Bonine in first pregnancy for nausea.     Past Medical History  Diagnosis Date  . Asthma     Past Surgical History  Procedure Date  . Breast lumpectomy     Family History  Problem Relation Age of Onset  . Asthma Mother   . Hypertension Mother   . Hypertension Maternal Grandmother   . Asthma Maternal Grandmother     History  Substance Use Topics  . Smoking status: Never Smoker   . Smokeless tobacco: Not on file  . Alcohol Use: Yes     Comment: rare    Allergies: No Known Allergies  Prescriptions prior to admission  Medication Sig Dispense Refill  . calcium carbonate (TUMS - DOSED IN MG ELEMENTAL CALCIUM) 500 MG chewable tablet Chew 1 tablet by mouth daily as needed. For heartburn      .  ondansetron (ZOFRAN ODT) 8 MG disintegrating tablet Take 1 tablet (8 mg total) by mouth every 8 (eight) hours as needed for nausea.  20 tablet  0    Review of Systems  Constitutional: Positive for malaise/fatigue. Negative for fever and chills.  Gastrointestinal: Positive for nausea, vomiting, abdominal pain and constipation. Negative for diarrhea.  Genitourinary:       No vaginal bleeding or pelvic pain   Neurological: Positive for weakness and headaches.   Physical Exam   Blood pressure 131/87, pulse 83, temperature 98.6 F (37 C), temperature source Oral, resp. rate 20, height 5\' 4"  (1.626 m), weight 83.915 kg (185 lb), last menstrual period 10/11/2012.  Physical Exam  Nursing note and vitals reviewed. Constitutional: She is oriented to person, place, and time. She appears well-developed and well-nourished.  GI: Soft. There is no tenderness. There is no guarding.  Neurological: She is alert and oriented to person, place, and time.  Psychiatric: She has a normal mood and affect.    No results found for this or any previous visit (from the past 24 hour(s)).  MAU Course  Procedures    Assessment and Plan  Susan Crawford is a 26 y.o. G77P0101 female with a gestational age of [redacted]w[redacted]d estimated by ultrasound on 10/26/12 1. Nausea/vomiting in pregnancy  Stop taking Zofran 2. Constipation in pregnancy  Susan Crawford 11/25/2012, 3:56 PM   Results for orders placed during the hospital encounter of 11/25/12 (from the past 24 hour(s))  URINALYSIS, ROUTINE W REFLEX MICROSCOPIC     Status: Abnormal   Collection Time   11/25/12  1:35 PM      Component Value Range   Color, Urine ORANGE (*) YELLOW   APPearance CLOUDY (*) CLEAR   Specific Gravity, Urine >1.030 (*) 1.005 - 1.030   pH 6.0  5.0 - 8.0   Glucose, UA NEGATIVE  NEGATIVE mg/dL   Hgb urine dipstick NEGATIVE  NEGATIVE   Bilirubin Urine NEGATIVE  NEGATIVE   Ketones, ur 15 (*) NEGATIVE mg/dL   Protein, ur NEGATIVE  NEGATIVE  mg/dL   Urobilinogen, UA 0.2  0.0 - 1.0 mg/dL   Nitrite NEGATIVE  NEGATIVE   Leukocytes, UA NEGATIVE  NEGATIVE   Had Fleets enema with good result Discharge with AVS constipation and N/V pregnancy. Rx Phenergan. Follow-up Information    Follow up with [facility]. (Keep your scheduled appointment)    Contact information:   [address], [address] [place] [address] [phone]       Evaluation and management procedures were performed by PA-s under my supervision/collaboration. Chart reviewed, patient examined by me and I agree with management and plan.

## 2012-11-27 NOTE — MAU Provider Note (Signed)
Attestation of Attending Supervision of Advanced Practitioner (CNM/NP): Evaluation and management procedures were performed by the Advanced Practitioner under my supervision and collaboration.  I have reviewed the Advanced Practitioner's note and chart, and I agree with the management and plan.  HARRAWAY-SMITH, Savahanna Almendariz 10:19 AM     

## 2012-11-28 ENCOUNTER — Encounter (HOSPITAL_COMMUNITY): Payer: Self-pay | Admitting: *Deleted

## 2012-11-28 ENCOUNTER — Inpatient Hospital Stay (HOSPITAL_COMMUNITY)
Admission: AD | Admit: 2012-11-28 | Discharge: 2012-11-29 | Disposition: A | Discharge status: Home / Self Care | Source: Ambulatory Visit | Attending: Obstetrics and Gynecology | Admitting: Obstetrics and Gynecology

## 2012-11-28 DIAGNOSIS — R109 Unspecified abdominal pain: Secondary | ICD-10-CM | POA: Insufficient documentation

## 2012-11-28 DIAGNOSIS — O210 Mild hyperemesis gravidarum: Secondary | ICD-10-CM | POA: Insufficient documentation

## 2012-11-28 LAB — COMPREHENSIVE METABOLIC PANEL
ALT: 10 U/L (ref 0–35)
Alkaline Phosphatase: 62 U/L (ref 39–117)
CO2: 21 mEq/L (ref 19–32)
GFR calc Af Amer: >90 mL/min (ref >90)
Glucose, Bld: 79 mg/dL (ref 70–99)
Potassium: 3.8 mEq/L (ref 3.5–5.1)
Sodium: 135 mEq/L (ref 135–145)
Total Protein: 8.5 g/dL — ABNORMAL HIGH (ref 6.0–8.3)

## 2012-11-28 LAB — CBC
Hemoglobin: 12 g/dL (ref 12.0–15.0)
MCHC: 33.1 g/dL (ref 30.0–36.0)
RBC: 4.16 MIL/uL (ref 3.87–5.11)
WBC: 11 10*3/uL — ABNORMAL HIGH (ref 4.0–10.5)

## 2012-11-28 LAB — URINALYSIS, ROUTINE W REFLEX MICROSCOPIC
Leukocytes, UA: NEGATIVE
Nitrite: NEGATIVE
Specific Gravity, Urine: >1.03 — ABNORMAL HIGH (ref 1.005–1.030)
pH: 6 (ref 5.0–8.0)

## 2012-11-28 MED ORDER — METOCLOPRAMIDE HCL 5 MG/ML IJ SOLN: 10.0000 mg | Freq: Once | INTRAMUSCULAR | Status: DC

## 2012-11-28 MED ORDER — METOCLOPRAMIDE HCL 5 MG/ML IJ SOLN
INTRAMUSCULAR | Status: AC
  Administered 2012-11-28: 10 mg
  Filled 2012-11-28: qty 2

## 2012-11-28 MED ORDER — M.V.I. ADULT IV INJ
10.0000 mL | Freq: Once | INTRAVENOUS | Status: AC
  Administered 2012-11-28: 10 mL via INTRAVENOUS
  Filled 2012-11-28: qty 10

## 2012-11-28 MED ORDER — PROMETHAZINE HCL 25 MG/ML IJ SOLN
25.0000 mg | Freq: Once | INTRAVENOUS | Status: AC
  Administered 2012-11-28: 25 mg via INTRAVENOUS
  Filled 2012-11-28: qty 1

## 2012-11-28 NOTE — MAU Provider Note (Signed)
History     CSN: [id number]  Arrival date and time: 11/28/12 1918   First Provider Initiated Contact with Patient 11/28/12 2012      Chief Complaint  Patient presents with  . Morning Sickness  . Abdominal Cramping   HPI Susan Crawford 26 y.o. [redacted]w[redacted]d Comes to MAU with repetitive vomiting.  Has not been able to keep down any food or fluids today.  Was seen on  in MAU for vomiting and on  for constipation.  Has not been able to keep down any pills.  Is having upper midline abdominal pain which she thinks is from the vomiting.  Is on her side rocking in bed with pain.  ] OB History    Grav Para Term Preterm Abortions TAB SAB Ect Mult Living   2 1  1      1       Past Medical History  Diagnosis Date  . Asthma     Past Surgical History  Procedure Date  . Breast lumpectomy     Family History  Problem Relation Age of Onset  . Asthma Mother   . Hypertension Mother   . Hypertension Maternal Grandmother   . Asthma Maternal Grandmother     History  Substance Use Topics  . Smoking status: Never Smoker   . Smokeless tobacco: Not on file  . Alcohol Use: Yes     Comment: rare    Allergies: No Known Allergies  Prescriptions prior to admission  Medication Sig Dispense Refill  . calcium carbonate (TUMS - DOSED IN MG ELEMENTAL CALCIUM) 500 MG chewable tablet Chew 1 tablet by mouth daily as needed. For heartburn      . metoCLOPramide (REGLAN) 5 MG tablet Take 1 tablet (5 mg total) by mouth 4 (four) times daily.  12 tablet  1  . promethazine (PHENERGAN) 12.5 MG tablet Take 1 tablet (12.5 mg total) by mouth every 6 (six) hours as needed for nausea.  30 tablet  0    Review of Systems  Constitutional: Negative for fever.  Gastrointestinal: Positive for nausea, vomiting, abdominal pain and constipation. Negative for diarrhea.  Genitourinary:       No vaginal discharge. No vaginal bleeding. No dysuria.   Physical Exam   Blood pressure 121/80, pulse 92, temperature 98.6 F  (37 C), temperature source Oral, resp. rate 20, height 5\' 5"  (1.651 m), weight 79.652 kg (175 lb 9.6 oz), last menstrual period 10/11/2012.  Physical Exam  Nursing note and vitals reviewed. Constitutional: She is oriented to person, place, and time. She appears well-developed and well-nourished.  HENT:  Head: Normocephalic.  Eyes: EOM are normal.  Neck: Neck supple.  GI: Soft. There is tenderness. There is no rebound and no guarding.       Most tenderness is in the upper midline, but also has mild diffuse tenderness all across the lower abdomen.  Musculoskeletal: Normal range of motion.  Neurological: She is alert and oriented to person, place, and time.  Skin: Skin is warm and dry.  Psychiatric: She has a normal mood and affect.    MAU Course  Procedures IV Fluids  - 1000cc LR with Phenergan 25 mg infused and 1000cc D5LR  With one amp of multivitamins infused.  Reglan 10 mg IVP given for heartburn.  Client sitting up and eating ice chips after IV fluids.  States she is feeling much better.  MDM Results for orders placed during the hospital encounter of 11/28/12 (from the past 24 hour(s))  CBC     Status: Abnormal   Collection Time   11/28/12  8:12 PM      Component Value Range   WBC 11.0 (*) 4.0 - 10.5 K/uL   RBC 4.16  3.87 - 5.11 MIL/uL   Hemoglobin 12.0  12.0 - 15.0 g/dL   HCT 62.1  30.8 - 65.7 %   MCV 87.0  78.0 - 100.0 fL   MCH 28.8  26.0 - 34.0 pg   MCHC 33.1  30.0 - 36.0 g/dL   RDW 84.6  96.2 - 95.2 %   Platelets 271  150 - 400 K/uL  COMPREHENSIVE METABOLIC PANEL     Status: Abnormal   Collection Time   11/28/12  8:12 PM      Component Value Range   Sodium 135  135 - 145 mEq/L   Potassium 3.8  3.5 - 5.1 mEq/L   Chloride 100  96 - 112 mEq/L   CO2 21  19 - 32 mEq/L   Glucose, Bld 79  70 - 99 mg/dL   BUN 9  6 - 23 mg/dL   Creatinine, Ser 8.41  0.50 - 1.10 mg/dL   Calcium 32.4 (*) 8.4 - 10.5 mg/dL   Total Protein 8.5 (*) 6.0 - 8.3 g/dL   Albumin 4.1  3.5 - 5.2 g/dL    AST 13  0 - 37 U/L   ALT 10  0 - 35 U/L   Alkaline Phosphatase 62  39 - 117 U/L   Total Bilirubin 0.5  0.3 - 1.2 mg/dL   GFR calc non Af Amer >90  >90 mL/min   GFR calc Af Amer >90  >90 mL/min  URINALYSIS, ROUTINE W REFLEX MICROSCOPIC     Status: Abnormal   Collection Time   11/28/12  9:50 PM      Component Value Range   Color, Urine YELLOW  YELLOW   APPearance CLEAR  CLEAR   Specific Gravity, Urine >1.030 (*) 1.005 - 1.030   pH 6.0  5.0 - 8.0   Glucose, UA NEGATIVE  NEGATIVE mg/dL   Hgb urine dipstick NEGATIVE  NEGATIVE   Bilirubin Urine NEGATIVE  NEGATIVE   Ketones, ur >80 (*) NEGATIVE mg/dL   Protein, ur NEGATIVE  NEGATIVE mg/dL   Urobilinogen, UA 1.0  0.0 - 1.0 mg/dL   Nitrite NEGATIVE  NEGATIVE   Leukocytes, UA NEGATIVE  NEGATIVE    Assessment and Plan  Hyperemesis of pregnancy  Plan Rx Phenergan suppositories to use if cannot keep down pills. Eat small amounts every 2-3 hours. BRAT diet tonight and advance slowly as tolerated. Keep your appointment in Dec.13 to begin prenatal care. Drink at least 8 8-oz glasses of water every day.   Johnda Billiot 11/28/2012, 8:12 PM

## 2012-11-28 NOTE — MAU Note (Signed)
Pt reports n/v and constipation. Was seen in MAU on Tuesday, but n/v persisting.

## 2012-11-28 NOTE — MAU Note (Signed)
Was seen here Tues. And med I was taking made me constipated. Seen here Tues and changed nausea med but is not helping. Can;'t keep down anything and having stomach cramps. Spitting a lot.

## 2012-11-29 MED ORDER — PROMETHAZINE HCL 25 MG RE SUPP: 25.0000 mg | Freq: Four times a day (QID) | RECTAL | Status: DC | PRN

## 2012-11-29 NOTE — MAU Provider Note (Signed)
Attestation of Attending Supervision of Advanced Practitioner: Evaluation and management procedures were performed by the PA/NP/CNM/OB Fellow under my supervision/collaboration. Chart reviewed and agree with management and plan.  Susan Crawford V 11/29/2012 2:34 AM

## 2012-11-29 NOTE — MAU Note (Signed)
Patient denies nausea able to keep down ice chips.

## 2012-12-05 LAB — OB RESULTS CONSOLE RPR: RPR: NONREACTIVE

## 2012-12-05 LAB — OB RESULTS CONSOLE ABO/RH: RH Type: POSITIVE

## 2012-12-05 LAB — OB RESULTS CONSOLE HGB/HCT, BLOOD
HCT: 35 %
Hemoglobin: 11.6 g/dL

## 2012-12-16 ENCOUNTER — Encounter (HOSPITAL_COMMUNITY): Payer: Self-pay | Admitting: *Deleted

## 2012-12-16 ENCOUNTER — Inpatient Hospital Stay (HOSPITAL_COMMUNITY)
Admission: AD | Admit: 2012-12-16 | Discharge: 2012-12-16 | Disposition: A | Discharge status: Home / Self Care | Source: Ambulatory Visit | Attending: Obstetrics & Gynecology | Admitting: Obstetrics & Gynecology

## 2012-12-16 DIAGNOSIS — O210 Mild hyperemesis gravidarum: Secondary | ICD-10-CM | POA: Insufficient documentation

## 2012-12-16 DIAGNOSIS — O219 Vomiting of pregnancy, unspecified: Secondary | ICD-10-CM | POA: Diagnosis present

## 2012-12-16 DIAGNOSIS — O99891 Other specified diseases and conditions complicating pregnancy: Secondary | ICD-10-CM | POA: Insufficient documentation

## 2012-12-16 DIAGNOSIS — A084 Viral intestinal infection, unspecified: Secondary | ICD-10-CM

## 2012-12-16 DIAGNOSIS — A088 Other specified intestinal infections: Secondary | ICD-10-CM | POA: Insufficient documentation

## 2012-12-16 LAB — COMPREHENSIVE METABOLIC PANEL
ALT: 19 U/L (ref 0–35)
AST: 36 U/L (ref 0–37)
Alkaline Phosphatase: 68 U/L (ref 39–117)
CO2: 23 mEq/L (ref 19–32)
Calcium: 10.3 mg/dL (ref 8.4–10.5)
GFR calc non Af Amer: >90 mL/min (ref >90)
Potassium: 4 mEq/L (ref 3.5–5.1)
Sodium: 136 mEq/L (ref 135–145)
Total Protein: 7.6 g/dL (ref 6.0–8.3)

## 2012-12-16 LAB — CBC
HCT: 34.1 % — ABNORMAL LOW (ref 36.0–46.0)
Hemoglobin: 11.1 g/dL — ABNORMAL LOW (ref 12.0–15.0)
MCH: 28 pg (ref 26.0–34.0)
MCHC: 32.6 g/dL (ref 30.0–36.0)
MCV: 85.9 fL (ref 78.0–100.0)
Platelets: 270 10*3/uL (ref 150–400)
RBC: 3.97 MIL/uL (ref 3.87–5.11)
RDW: 11.5 % (ref 11.5–15.5)
WBC: 9.6 10*3/uL (ref 4.0–10.5)

## 2012-12-16 LAB — URINALYSIS, ROUTINE W REFLEX MICROSCOPIC
Ketones, ur: >80 mg/dL — AB
Nitrite: NEGATIVE
pH: 6.5 (ref 5.0–8.0)

## 2012-12-16 MED ORDER — GLYCOPYRROLATE 1 MG PO TABS
1.0000 mg | ORAL_TABLET | ORAL | Status: AC
  Administered 2012-12-16: 1 mg via ORAL
  Filled 2012-12-16: qty 1

## 2012-12-16 MED ORDER — PROMETHAZINE HCL 25 MG/ML IJ SOLN
25.0000 mg | Freq: Once | INTRAMUSCULAR | Status: AC
  Administered 2012-12-16: 25 mg via INTRAVENOUS
  Filled 2012-12-16: qty 1

## 2012-12-16 MED ORDER — GLYCOPYRROLATE 2 MG PO TABS: 2.0000 mg | ORAL_TABLET | Freq: Three times a day (TID) | ORAL | Status: DC | PRN

## 2012-12-16 MED ORDER — PROMETHAZINE HCL 25 MG RE SUPP: 25.0000 mg | Freq: Four times a day (QID) | RECTAL | Status: DC | PRN

## 2012-12-16 MED ORDER — FAMOTIDINE IN NACL 20-0.9 MG/50ML-% IV SOLN
20.0000 mg | INTRAVENOUS | Status: AC
  Administered 2012-12-16: 20 mg via INTRAVENOUS
  Filled 2012-12-16: qty 50

## 2012-12-16 MED ORDER — ONDANSETRON HCL 4 MG/2ML IJ SOLN: 4.0000 mg | INTRAMUSCULAR | Status: DC

## 2012-12-16 NOTE — MAU Note (Signed)
Vomiting for the last couple of weeks. Been taking the phenergan suppositories and pills but they are not helping.

## 2012-12-16 NOTE — MAU Provider Note (Signed)
Chief Complaint: Nausea and Emesis   First Provider Initiated Contact with Patient 12/16/12 2012     SUBJECTIVE HPI: Susan Crawford is a 26 y.o. G2P0101 at [redacted]w[redacted]d by LMP who presents to maternity admissions reporting worsening n/v of pregnancy and diarrhea x1-2 in 24 hours.  She reports not keeping any food or liquids down today and constant spitting with vomiting x4-5.  She has had n/v for several weeks and been treated with Diclegis, Reglan, Phenergan PO, Phenergan suppositories, and Zantac daily.  She reports that these medications are not working and swallowing pills makes her feel worse.  She has not use her suppositories today, last use was yesterday.  She denies LOF, vaginal bleeding, vaginal itching/burning, urinary symptoms, h/a, dizziness, n/v, or fever/chills.     Past Medical History  Diagnosis Date  . Asthma    Past Surgical History  Procedure Date  . Breast lumpectomy    History   Social History  . Marital Status: Single    Spouse Name: N/A    Number of Children: N/A  . Years of Education: N/A   Occupational History  . Not on file.   Social History Main Topics  . Smoking status: Never Smoker   . Smokeless tobacco: Not on file  . Alcohol Use: Yes     Comment: rare  . Drug Use: No  . Sexually Active: Yes   Other Topics Concern  . Not on file   Social History Narrative  . No narrative on file   No current facility-administered medications on file prior to encounter.   Current Outpatient Prescriptions on File Prior to Encounter  Medication Sig Dispense Refill  . calcium carbonate (TUMS - DOSED IN MG ELEMENTAL CALCIUM) 500 MG chewable tablet Chew 1 tablet by mouth daily as needed. For heartburn      . metoCLOPramide (REGLAN) 5 MG tablet Take 1 tablet (5 mg total) by mouth 4 (four) times daily.  12 tablet  1  . promethazine (PHENERGAN) 12.5 MG tablet Take 1 tablet (12.5 mg total) by mouth every 6 (six) hours as needed for nausea.  30 tablet  0  . promethazine  (PHENERGAN) 25 MG suppository Place 1 suppository (25 mg total) rectally every 6 (six) hours as needed for nausea. May cause sedation.  12 each  0   No Known Allergies  ROS: Pertinent items in HPI  OBJECTIVE Blood pressure 119/73, pulse 95, temperature 100.2 F (37.9 C), temperature source Oral, resp. rate 22, height 5\' 5"  (1.651 m), weight 76.431 kg (168 lb 8 oz), last menstrual period 10/11/2012, SpO2 99.00%. GENERAL: Well-developed, well-nourished female in no acute distress.  HEENT: Normocephalic HEART: normal rate RESP: normal effort ABDOMEN: Soft, non-tender EXTREMITIES: Nontender, no edema NEURO: Alert and oriented SPECULUM EXAM: Deferred  LAB RESULTS Results for orders placed during the hospital encounter of 12/16/12 (from the past 24 hour(s))  URINALYSIS, ROUTINE W REFLEX MICROSCOPIC     Status: Abnormal   Collection Time   12/16/12  7:05 PM      Component Value Range   Color, Urine YELLOW  YELLOW   APPearance CLEAR  CLEAR   Specific Gravity, Urine 1.020  1.005 - 1.030   pH 6.5  5.0 - 8.0   Glucose, UA NEGATIVE  NEGATIVE mg/dL   Hgb urine dipstick NEGATIVE  NEGATIVE   Bilirubin Urine SMALL (*) NEGATIVE   Ketones, ur >80 (*) NEGATIVE mg/dL   Protein, ur NEGATIVE  NEGATIVE mg/dL   Urobilinogen, UA 2.0 (*) 0.0 -  1.0 mg/dL   Nitrite NEGATIVE  NEGATIVE   Leukocytes, UA NEGATIVE  NEGATIVE     ASSESSMENT 1. Nausea/vomiting in pregnancy   2. Viral gastroenteritis     PLAN IV D5LR with 25mg  Phenergan x1000 ml, Pepcid 20 mg IV, Robinul 1mg  PO x1 Pt tolerating PO fluids after treatment Discharge home Phenergan suppositories 25 mg Q 6 hours Robinul 2 mg PO TID PRN for spitting F/U with Dr Tamela Oddi Return to MAU as needed   Sharen Counter Certified Nurse-Midwife 12/16/2012  8:25 PM

## 2012-12-22 LAB — OB RESULTS CONSOLE GC/CHLAMYDIA: Gonorrhea: NEGATIVE

## 2012-12-24 DIAGNOSIS — N200 Calculus of kidney: Secondary | ICD-10-CM

## 2012-12-24 HISTORY — DX: Calculus of kidney: N20.0

## 2012-12-24 NOTE — L&D Delivery Note (Signed)
Delivery Note At 3:13 PM a viable female was delivered via Vaginal, Spontaneous Delivery (Presentation: Vtx ;  ).  APGAR: 9, 9; weight .   Placenta status: Intact, Spontaneous.  Cord:  with the following complications: .  Cord pH: none  Anesthesia: Epidural  Episiotomy: None Lacerations: None Suture Repair: none Est. Blood Loss (mL): 400  Mom to postpartum.  Baby to nursery-stable.  Bahar Shelden A 06/19/2013, 3:30 PM

## 2013-01-19 ENCOUNTER — Other Ambulatory Visit: Payer: Self-pay | Admitting: Obstetrics & Gynecology

## 2013-01-19 DIAGNOSIS — Z3480 Encounter for supervision of other normal pregnancy, unspecified trimester: Secondary | ICD-10-CM

## 2013-01-27 ENCOUNTER — Ambulatory Visit (HOSPITAL_COMMUNITY)
Admission: RE | Admit: 2013-01-27 | Discharge: 2013-01-27 | Disposition: A | Discharge status: Home / Self Care | Source: Ambulatory Visit | Attending: Obstetrics & Gynecology | Admitting: Obstetrics & Gynecology

## 2013-01-27 DIAGNOSIS — Z3480 Encounter for supervision of other normal pregnancy, unspecified trimester: Secondary | ICD-10-CM

## 2013-01-27 DIAGNOSIS — Z3689 Encounter for other specified antenatal screening: Secondary | ICD-10-CM | POA: Insufficient documentation

## 2013-02-16 ENCOUNTER — Other Ambulatory Visit (HOSPITAL_COMMUNITY): Payer: Self-pay | Admitting: Obstetrics & Gynecology

## 2013-02-16 DIAGNOSIS — IMO0002 Reserved for concepts with insufficient information to code with codable children: Secondary | ICD-10-CM

## 2013-02-22 ENCOUNTER — Encounter (HOSPITAL_COMMUNITY): Payer: Self-pay | Admitting: *Deleted

## 2013-02-22 ENCOUNTER — Inpatient Hospital Stay (HOSPITAL_COMMUNITY)
Admission: AD | Admit: 2013-02-22 | Discharge: 2013-02-22 | Disposition: A | Discharge status: Home / Self Care | Source: Ambulatory Visit | Attending: Obstetrics & Gynecology | Admitting: Obstetrics & Gynecology

## 2013-02-22 DIAGNOSIS — O212 Late vomiting of pregnancy: Secondary | ICD-10-CM | POA: Insufficient documentation

## 2013-02-22 LAB — URINALYSIS, ROUTINE W REFLEX MICROSCOPIC
Glucose, UA: NEGATIVE mg/dL
Hgb urine dipstick: NEGATIVE
Ketones, ur: NEGATIVE mg/dL
Protein, ur: NEGATIVE mg/dL

## 2013-02-22 LAB — CBC
HCT: 32.6 % — ABNORMAL LOW (ref 36.0–46.0)
MCH: 28.6 pg (ref 26.0–34.0)
MCHC: 32.2 g/dL (ref 30.0–36.0)
MCV: 88.8 fL (ref 78.0–100.0)
RDW: 12.4 % (ref 11.5–15.5)

## 2013-02-22 LAB — COMPREHENSIVE METABOLIC PANEL
Albumin: 3 g/dL — ABNORMAL LOW (ref 3.5–5.2)
BUN: 5 mg/dL — ABNORMAL LOW (ref 6–23)
Creatinine, Ser: 0.52 mg/dL (ref 0.50–1.10)
Total Bilirubin: 0.3 mg/dL (ref 0.3–1.2)
Total Protein: 6.9 g/dL (ref 6.0–8.3)

## 2013-02-22 LAB — LIPASE, BLOOD: Lipase: 14 U/L (ref 11–59)

## 2013-02-22 LAB — AMYLASE: Amylase: 102 U/L (ref 0–105)

## 2013-02-22 MED ORDER — ONDANSETRON 8 MG PO TBDP
8.0000 mg | ORAL_TABLET | Freq: Once | ORAL | Status: AC
  Administered 2013-02-22: 8 mg via ORAL
  Filled 2013-02-22: qty 1

## 2013-02-22 MED ORDER — GI COCKTAIL ~~LOC~~
30.0000 mL | Freq: Once | ORAL | Status: AC
  Administered 2013-02-22: 30 mL via ORAL
  Filled 2013-02-22: qty 30

## 2013-02-22 MED ORDER — ONDANSETRON 8 MG PO TBDP: 8.0000 mg | ORAL_TABLET | Freq: Three times a day (TID) | ORAL | Status: DC | PRN

## 2013-02-22 NOTE — MAU Provider Note (Signed)
History     CSN: [id number]  Arrival date and time: 02/22/13 1108   First Blanton Kardell Initiated Contact with Patient 02/22/13 1146      Chief Complaint  Patient presents with  . Abdominal Pain   HPI 27 y.o. G2P0101 at [redacted]w[redacted]d with n/v today. "Soft stool" today, no diarrhea. + chills, no fever. Pain "all over" abdomen, upper abd pain after eating this morning.    Past Medical History  Diagnosis Date  . Asthma     Past Surgical History  Procedure Laterality Date  . Breast lumpectomy      Family History  Problem Relation Age of Onset  . Asthma Mother   . Hypertension Mother   . Hypertension Maternal Grandmother   . Asthma Maternal Grandmother   . Other Neg Hx     History  Substance Use Topics  . Smoking status: Never Smoker   . Smokeless tobacco: Not on file  . Alcohol Use: Yes     Comment: rare    Allergies: No Known Allergies  No prescriptions prior to admission    Review of Systems  Constitutional: Positive for chills. Negative for fever.  Respiratory: Negative.   Cardiovascular: Negative.   Gastrointestinal: Positive for heartburn, nausea, vomiting and abdominal pain. Negative for diarrhea and constipation.  Genitourinary: Negative for dysuria, urgency, frequency, hematuria and flank pain.       Negative for vaginal bleeding, vaginal discharge,cramping/contractions  Musculoskeletal: Negative.   Neurological: Negative.   Psychiatric/Behavioral: Negative.    Physical Exam   Blood pressure 127/69, pulse 99, temperature 97.2 F (36.2 C), temperature source Oral, resp. rate 18, last menstrual period 10/11/2012.  Physical Exam  Nursing note and vitals reviewed. Constitutional: She is oriented to person, place, and time. She appears well-developed and well-nourished. No distress.  Cardiovascular: Normal rate.   Respiratory: Effort normal.  GI: Soft. She exhibits no distension and no mass. There is no tenderness. There is no rebound and no guarding.   Musculoskeletal: Normal range of motion.  Neurological: She is alert and oriented to person, place, and time.  Skin: Skin is warm and dry.  Psychiatric: She has a normal mood and affect.    MAU Course  Procedures  Results for orders placed during the hospital encounter of 02/22/13 (from the past 24 hour(s))  URINALYSIS, ROUTINE W REFLEX MICROSCOPIC     Status: None   Collection Time    02/22/13 11:12 AM      Result Value Range   Color, Urine YELLOW  YELLOW   APPearance CLEAR  CLEAR   Specific Gravity, Urine 1.020  1.005 - 1.030   pH 7.0  5.0 - 8.0   Glucose, UA NEGATIVE  NEGATIVE mg/dL   Hgb urine dipstick NEGATIVE  NEGATIVE   Bilirubin Urine NEGATIVE  NEGATIVE   Ketones, ur NEGATIVE  NEGATIVE mg/dL   Protein, ur NEGATIVE  NEGATIVE mg/dL   Urobilinogen, UA 0.2  0.0 - 1.0 mg/dL   Nitrite NEGATIVE  NEGATIVE   Leukocytes, UA NEGATIVE  NEGATIVE  CBC     Status: Abnormal   Collection Time    02/22/13 12:01 PM      Result Value Range   WBC 12.5 (*) 4.0 - 10.5 K/uL   RBC 3.67 (*) 3.87 - 5.11 MIL/uL   Hemoglobin 10.5 (*) 12.0 - 15.0 g/dL   HCT 13.2 (*) 44.0 - 10.2 %   MCV 88.8  78.0 - 100.0 fL   MCH 28.6  26.0 - 34.0 pg  MCHC 32.2  30.0 - 36.0 g/dL   RDW 28.4  13.2 - 44.0 %   Platelets 252  150 - 400 K/uL  COMPREHENSIVE METABOLIC PANEL     Status: Abnormal   Collection Time    02/22/13 12:01 PM      Result Value Range   Sodium 135  135 - 145 mEq/L   Potassium 3.8  3.5 - 5.1 mEq/L   Chloride 103  96 - 112 mEq/L   CO2 22  19 - 32 mEq/L   Glucose, Bld 87  70 - 99 mg/dL   BUN 5 (*) 6 - 23 mg/dL   Creatinine, Ser 1.02  0.50 - 1.10 mg/dL   Calcium 9.1  8.4 - 72.5 mg/dL   Total Protein 6.9  6.0 - 8.3 g/dL   Albumin 3.0 (*) 3.5 - 5.2 g/dL   AST 14  0 - 37 U/L   ALT 9  0 - 35 U/L   Alkaline Phosphatase 86  39 - 117 U/L   Total Bilirubin 0.3  0.3 - 1.2 mg/dL   GFR calc non Af Amer >90  >90 mL/min   GFR calc Af Amer >90  >90 mL/min  AMYLASE     Status: None   Collection  Time    02/22/13 12:01 PM      Result Value Range   Amylase 102  0 - 105 U/L  LIPASE, BLOOD     Status: None   Collection Time    02/22/13 12:01 PM      Result Value Range   Lipase 14  11 - 59 U/L   Zofran ODT 8 mg and GI cocktail given in MAU, feeling much better afterwards  Assessment and Plan   1. Nausea/vomiting in pregnancy       Medication List    TAKE these medications       acetaminophen 500 MG tablet  Commonly known as:  TYLENOL  Take 1,000 mg by mouth every 4 (four) hours as needed for pain.     ondansetron 8 MG disintegrating tablet  Commonly known as:  ZOFRAN ODT  Take 1 tablet (8 mg total) by mouth every 8 (eight) hours as needed for nausea.     prenatal multivitamin Tabs  Take 1 tablet by mouth daily at 12 noon.     promethazine 25 MG tablet  Commonly known as:  PHENERGAN  Take 25 mg by mouth every 6 (six) hours as needed for nausea.     ranitidine 75 MG tablet  Commonly known as:  ZANTAC  Take 75 mg by mouth 2 (two) times daily as needed for heartburn.     terconazole 0.4 % vaginal cream  Commonly known as:  TERAZOL 7  Place 1 applicator vaginally at bedtime as needed. For itching        Follow-up Information   Follow up with Roseanna Rainbow, MD. (as scheduled or sooner as needed)    Contact information:   [address], [address] [place] [address] [phone]         [facility] 02/23/2013, 8:23 AM

## 2013-02-22 NOTE — MAU Note (Signed)
Pt presents with complaints of nausea and vomiting this morning with abdominal pain. Denies any bleeding or cramping.

## 2013-02-24 ENCOUNTER — Ambulatory Visit (HOSPITAL_COMMUNITY)

## 2013-02-25 ENCOUNTER — Inpatient Hospital Stay (HOSPITAL_COMMUNITY)
Admission: AD | Admit: 2013-02-25 | Discharge: 2013-02-25 | Disposition: A | Discharge status: Home / Self Care | Source: Ambulatory Visit | Attending: Obstetrics & Gynecology | Admitting: Obstetrics & Gynecology

## 2013-02-25 ENCOUNTER — Encounter (HOSPITAL_COMMUNITY): Payer: Self-pay

## 2013-02-25 ENCOUNTER — Ambulatory Visit (HOSPITAL_COMMUNITY)
Admission: RE | Admit: 2013-02-25 | Discharge: 2013-02-25 | Disposition: A | Discharge status: Home / Self Care | Source: Ambulatory Visit | Attending: Obstetrics & Gynecology | Admitting: Obstetrics & Gynecology

## 2013-02-25 DIAGNOSIS — Z0489 Encounter for examination and observation for other specified reasons: Secondary | ICD-10-CM

## 2013-02-25 DIAGNOSIS — Z3689 Encounter for other specified antenatal screening: Secondary | ICD-10-CM | POA: Insufficient documentation

## 2013-02-25 DIAGNOSIS — O212 Late vomiting of pregnancy: Secondary | ICD-10-CM | POA: Insufficient documentation

## 2013-02-25 LAB — URINALYSIS, ROUTINE W REFLEX MICROSCOPIC
Bilirubin Urine: NEGATIVE
Nitrite: NEGATIVE
Specific Gravity, Urine: 1.02 (ref 1.005–1.030)
Urobilinogen, UA: 1 mg/dL (ref 0.0–1.0)

## 2013-02-25 MED ORDER — SODIUM CHLORIDE 0.9 % IV SOLN
25.0000 mg | Freq: Once | INTRAVENOUS | Status: AC
  Administered 2013-02-25: 25 mg via INTRAVENOUS
  Filled 2013-02-25: qty 1

## 2013-02-25 MED ORDER — METOCLOPRAMIDE HCL 10 MG PO TABS: 10.0000 mg | ORAL_TABLET | Freq: Four times a day (QID) | ORAL | Status: DC

## 2013-02-25 MED ORDER — METOCLOPRAMIDE HCL 5 MG/ML IJ SOLN
10.0000 mg | Freq: Once | INTRAMUSCULAR | Status: AC
  Administered 2013-02-25: 10 mg via INTRAVENOUS
  Filled 2013-02-25: qty 2

## 2013-02-25 MED ORDER — PROMETHAZINE HCL 25 MG RE SUPP: 25.0000 mg | Freq: Four times a day (QID) | RECTAL | Status: DC | PRN

## 2013-02-25 NOTE — MAU Provider Note (Signed)
History     CSN: [id number]  Arrival date and time: 02/25/13 1217   First Provider Initiated Contact with Patient 02/25/13 1354      Chief Complaint  Patient presents with  . Emesis   HPI This is a 27 y.o. female at [redacted]w[redacted]d who presents with c /o nausea and vomiting.  Has been using Phenergan suppositories at home but states they are not working. Denies fever or other symptoms.   RN Note: Has not been able to keep anything down, solids or liquids. Noting blood in saliva.  OB History   Grav Para Term Preterm Abortions TAB SAB Ect Mult Living   2 1  1      1       Past Medical History  Diagnosis Date  . Asthma   . Preterm labor     Past Surgical History  Procedure Laterality Date  . Breast lumpectomy      Family History  Problem Relation Age of Onset  . Asthma Mother   . Hypertension Mother   . Hypertension Maternal Grandmother   . Asthma Maternal Grandmother   . Other Neg Hx     History  Substance Use Topics  . Smoking status: Never Smoker   . Smokeless tobacco: Never Used  . Alcohol Use: Yes     Comment: rare    Allergies: No Known Allergies  Prescriptions prior to admission  Medication Sig Dispense Refill  . acetaminophen (TYLENOL) 500 MG tablet Take 1,000 mg by mouth every 4 (four) hours as needed for pain.      Marland Kitchen ondansetron (ZOFRAN ODT) 8 MG disintegrating tablet Take 1 tablet (8 mg total) by mouth every 8 (eight) hours as needed for nausea.  20 tablet  0  . Prenatal Vit-Fe Fumarate-FA (PRENATAL MULTIVITAMIN) TABS Take 1 tablet by mouth daily at 12 noon.      . promethazine (PHENERGAN) 25 MG tablet Take 25 mg by mouth every 6 (six) hours as needed for nausea.      . ranitidine (ZANTAC) 75 MG tablet Take 75 mg by mouth 2 (two) times daily as needed for heartburn.         Review of Systems  Constitutional: Negative for fever, chills and malaise/fatigue.  Gastrointestinal: Positive for nausea and vomiting. Negative for abdominal pain, diarrhea and  constipation.  Genitourinary: Negative for dysuria.  Neurological: Positive for weakness. Negative for dizziness.   Physical Exam   Blood pressure 122/76, pulse 111, temperature 98.5 F (36.9 C), temperature source Oral, resp. rate 18, height 5\' 5"  (1.651 m), weight 190 lb (86.183 kg), last menstrual period 10/11/2012.  Physical Exam  Constitutional: She is oriented to person, place, and time. She appears well-developed and well-nourished. No distress.  HENT:  Head: Normocephalic.  Cardiovascular: Normal rate.   Respiratory: Effort normal.  GI: Soft. She exhibits no distension and no mass. There is no tenderness. There is no rebound and no guarding.  Musculoskeletal: Normal range of motion.  Neurological: She is alert and oriented to person, place, and time.  Skin: Skin is warm and dry.  Psychiatric: She has a normal mood and affect.   FHR 145  MAU Course  Procedures  MDM Gave her one liter of IVF with Phenergan and a dose of Reglan. She got excellent relief from that.   Assessment and Plan  A:  SIUP at [redacted]w[redacted]d       Hyperemesis  P:  Discharge home      Discussed with Dr Tamela Oddi  Rx Phenergan Suppositories and Reglan     Follow up with office  [facility] 02/25/2013, 1:55 PM

## 2013-02-25 NOTE — MAU Note (Signed)
Name and DOB verified, pt confirmed spelling is correct on arm band. 

## 2013-02-25 NOTE — MAU Note (Signed)
Pt states constant spitting, nausea and vomiting, unable to keep anything down.

## 2013-02-25 NOTE — MAU Note (Signed)
Has not been able to keep anything down, solids or liquids.  Noting blood in saliva.

## 2013-03-02 ENCOUNTER — Encounter (HOSPITAL_COMMUNITY): Payer: Self-pay | Admitting: *Deleted

## 2013-03-02 ENCOUNTER — Inpatient Hospital Stay (HOSPITAL_COMMUNITY)
Admission: AD | Admit: 2013-03-02 | Discharge: 2013-03-02 | Disposition: A | Discharge status: Home / Self Care | Source: Ambulatory Visit | Attending: Obstetrics & Gynecology | Admitting: Obstetrics & Gynecology

## 2013-03-02 DIAGNOSIS — R21 Rash and other nonspecific skin eruption: Secondary | ICD-10-CM | POA: Insufficient documentation

## 2013-03-02 DIAGNOSIS — O99891 Other specified diseases and conditions complicating pregnancy: Secondary | ICD-10-CM | POA: Insufficient documentation

## 2013-03-02 MED ORDER — HYDROXYZINE HCL 25 MG PO TABS
25.0000 mg | ORAL_TABLET | Freq: Once | ORAL | Status: AC
  Administered 2013-03-02: 25 mg via ORAL
  Filled 2013-03-02: qty 1

## 2013-03-02 MED ORDER — TRIAMCINOLONE ACETONIDE 0.1 % EX LOTN: TOPICAL_LOTION | Freq: Once | CUTANEOUS | Status: DC

## 2013-03-02 MED ORDER — TRIAMCINOLONE ACETONIDE 0.1 % EX CREA
TOPICAL_CREAM | Freq: Three times a day (TID) | CUTANEOUS | Status: DC
  Administered 2013-03-02: 19:00:00 via TOPICAL
  Filled 2013-03-02: qty 15

## 2013-03-02 MED ORDER — CLOTRIMAZOLE 1 % EX CREA: TOPICAL_CREAM | Freq: Two times a day (BID) | CUTANEOUS | Status: DC

## 2013-03-02 NOTE — MAU Note (Signed)
Patient states she has had a rash with itching on both arms for 2 days. Denies any problems with the pregnancy.

## 2013-03-02 NOTE — MAU Note (Signed)
C/o rash on both arms for past 3 days; rash is itchy and "seems to be spreading";

## 2013-03-02 NOTE — MAU Provider Note (Signed)
History     CSN: [id number]  Arrival date and time: 03/02/13 1830   None     Chief Complaint  Patient presents with  . Rash   HPI THis is a 27 y.o. female at [redacted]w[redacted]d who presents with c/o itchy red lesions on her arms and one on her back. Started out as small bumps like bites. Got bigger when she scratched. No fever or somatic symptoms.  RN Note: Patient states she has had a rash with itching on both arms for 2 days. Denies any problems with the pregnancy.   OB History   Grav Para Term Preterm Abortions TAB SAB Ect Mult Living   2 1  1      1       Past Medical History  Diagnosis Date  . Asthma   . Preterm labor     Past Surgical History  Procedure Laterality Date  . Breast lumpectomy      Family History  Problem Relation Age of Onset  . Asthma Mother   . Hypertension Mother   . Hypertension Maternal Grandmother   . Asthma Maternal Grandmother   . Other Neg Hx     History  Substance Use Topics  . Smoking status: Never Smoker   . Smokeless tobacco: Never Used  . Alcohol Use: Yes     Comment: rare    Allergies: No Known Allergies  Prescriptions prior to admission  Medication Sig Dispense Refill  . acetaminophen (TYLENOL) 500 MG tablet Take 1,000 mg by mouth every 4 (four) hours as needed for pain.      Marland Kitchen metoCLOPramide (REGLAN) 10 MG tablet Take 1 tablet (10 mg total) by mouth 4 (four) times daily.  40 tablet  1  . Prenatal Vit-Fe Fumarate-FA (PRENATAL MULTIVITAMIN) TABS Take 1 tablet by mouth daily at 12 noon.      . promethazine (PHENERGAN) 25 MG suppository Place 1 suppository (25 mg total) rectally every 6 (six) hours as needed for nausea.  20 each  0  . ranitidine (ZANTAC) 75 MG tablet Take 75 mg by mouth 2 (two) times daily as needed for heartburn.         Review of Systems  Constitutional: Negative for fever, chills and malaise/fatigue.  Gastrointestinal: Negative for nausea, vomiting and abdominal pain.  Genitourinary: Negative for dysuria.   Skin: Positive for itching and rash.  Neurological: Negative for dizziness and headaches.   Physical Exam   Blood pressure 125/76, pulse 110, temperature 98.1 F (36.7 C), temperature source Oral, resp. rate 16, height 5\' 6"  (1.676 m), weight 192 lb 12.8 oz (87.454 kg), last menstrual period 10/11/2012, SpO2 100.00%.  Physical Exam  Constitutional: She is oriented to person, place, and time. She appears well-developed and well-nourished. No distress.  Cardiovascular: Normal rate.   Respiratory: Effort normal.  GI: Soft. She exhibits no distension. There is no tenderness. There is no rebound and no guarding.  Musculoskeletal: Normal range of motion.  Neurological: She is alert and oriented to person, place, and time.  Skin: Skin is warm and dry. Rash noted.  Multiple scattered maculopapular lesions on both arms, one on back. Each is about 3-4cm round, erethema with small papule in center. No pattern. Nothing interdigital  Psychiatric: She has a normal mood and affect.    MAU Course  Procedures  MDM Kenalog cream applied and Vistaril given   Assessment and Plan  A:  SIUP at [redacted]w[redacted]d       Scattered maculopapular lesions  Unknown cause  P:  WIll discharge home       Rx Kenalog cream to lesions. NO longer than 2 wks due to skin thinnning       Call Dr Tamela Oddi to get Dermatology referral  [facility] 03/02/2013, 8:04 PM

## 2013-03-10 ENCOUNTER — Telehealth: Payer: Self-pay | Admitting: *Deleted

## 2013-03-10 NOTE — Telephone Encounter (Signed)
Pt called office- states she has used the recommended benadryl and oatmeal bath for her rash. Patient states she is no better and still itching with more bumps. She wants to know what to do.

## 2013-03-12 NOTE — Telephone Encounter (Signed)
Pt called back- no response from Doctor- will speak to Dr Clearance Coots regarding patient symptoms. [facility]

## 2013-03-12 NOTE — Telephone Encounter (Signed)
Per Dr Clearance Coots- call in Prednisone 10mg  double strength dose pack 21d take as directed 0RF.  Pt informed and she will call with response.

## 2013-03-18 ENCOUNTER — Other Ambulatory Visit: Admitting: *Deleted

## 2013-03-18 ENCOUNTER — Ambulatory Visit (INDEPENDENT_AMBULATORY_CARE_PROVIDER_SITE_OTHER): Admitting: Obstetrics & Gynecology

## 2013-03-18 ENCOUNTER — Encounter: Payer: Self-pay | Admitting: Obstetrics & Gynecology

## 2013-03-18 VITALS — BP 128/86 | Temp 97.7°F | Wt 192.0 lb

## 2013-03-18 DIAGNOSIS — Z3480 Encounter for supervision of other normal pregnancy, unspecified trimester: Secondary | ICD-10-CM

## 2013-03-18 DIAGNOSIS — Z3482 Encounter for supervision of other normal pregnancy, second trimester: Secondary | ICD-10-CM

## 2013-03-18 LAB — POCT URINALYSIS DIPSTICK
Bilirubin, UA: NEGATIVE
Blood, UA: NEGATIVE
Glucose, UA: NEGATIVE
Ketones, UA: NEGATIVE
Spec Grav, UA: 1.02

## 2013-03-18 LAB — CBC
HCT: 30.5 % — ABNORMAL LOW (ref 36.0–46.0)
Hemoglobin: 9.9 g/dL — ABNORMAL LOW (ref 12.0–15.0)
MCH: 28.1 pg (ref 26.0–34.0)
MCHC: 32.5 g/dL (ref 30.0–36.0)

## 2013-03-18 NOTE — Progress Notes (Signed)
Pulse- 96 

## 2013-03-18 NOTE — Progress Notes (Signed)
No complaints. Doing well.

## 2013-03-27 ENCOUNTER — Encounter: Payer: Self-pay | Admitting: *Deleted

## 2013-03-31 ENCOUNTER — Encounter: Payer: Self-pay | Admitting: Obstetrics & Gynecology

## 2013-04-01 ENCOUNTER — Ambulatory Visit (INDEPENDENT_AMBULATORY_CARE_PROVIDER_SITE_OTHER): Admitting: Obstetrics & Gynecology

## 2013-04-01 ENCOUNTER — Encounter: Payer: Self-pay | Admitting: Obstetrics & Gynecology

## 2013-04-01 VITALS — BP 105/74 | Temp 98.3°F | Wt 192.6 lb

## 2013-04-01 DIAGNOSIS — Z3482 Encounter for supervision of other normal pregnancy, second trimester: Secondary | ICD-10-CM

## 2013-04-01 DIAGNOSIS — Z3480 Encounter for supervision of other normal pregnancy, unspecified trimester: Secondary | ICD-10-CM | POA: Insufficient documentation

## 2013-04-01 LAB — POCT URINALYSIS DIPSTICK
Glucose, UA: NEGATIVE
Ketones, UA: NEGATIVE
Spec Grav, UA: 1.015
Urobilinogen, UA: NEGATIVE

## 2013-04-01 NOTE — Patient Instructions (Signed)
Tetanus, Diphtheria, Pertussis (Tdap) Vaccine What You Need to Know WHY GET VACCINATED? Tetanus, diphtheria and pertussis can be very serious diseases, even for adolescents and adults. Tdap vaccine can protect us from these diseases. TETANUS (Lockjaw) causes painful muscle tightening and stiffness, usually all over the body.  It can lead to tightening of muscles in the head and neck so you can't open your mouth, swallow, or sometimes even breathe. Tetanus kills about 1 out of 5 people who are infected. DIPHTHERIA can cause a thick coating to form in the back of the throat.  It can lead to breathing problems, paralysis, heart failure, and death. PERTUSSIS (Whooping Cough) causes severe coughing spells, which can cause difficulty breathing, vomiting and disturbed sleep.  It can also lead to weight loss, incontinence, and rib fractures. Up to 2 in 100 adolescents and 5 in 100 adults with pertussis are hospitalized or have complications, which could include pneumonia and death. These diseases are caused by bacteria. Diphtheria and pertussis are spread from person to person through coughing or sneezing. Tetanus enters the body through cuts, scratches, or wounds. Before vaccines, the [place] saw as many as 200,000 cases a year of diphtheria and pertussis, and hundreds of cases of tetanus. Since vaccination began, tetanus and diphtheria have dropped by about 99% and pertussis by about 80%. TDAP VACCINE Tdap vaccine can protect adolescents and adults from tetanus, diphtheria, and pertussis. One dose of Tdap is routinely given at age 11 or 12. People who did not get Tdap at that age should get it as soon as possible. Tdap is especially important for health care professionals and anyone having close contact with a baby younger than 12 months. Pregnant women should get a dose of Tdap during every pregnancy, to protect the newborn from pertussis. Infants are most at risk for severe, life-threatening  complications from pertussis. A similar vaccine, called Td, protects from tetanus and diphtheria, but not pertussis. A Td booster should be given every 10 years. Tdap may be given as one of these boosters if you have not already gotten a dose. Tdap may also be given after a severe cut or burn to prevent tetanus infection. Your doctor can give you more information. Tdap may safely be given at the same time as other vaccines. SOME PEOPLE SHOULD NOT GET THIS VACCINE  If you ever had a life-threatening allergic reaction after a dose of any tetanus, diphtheria, or pertussis containing vaccine, OR if you have a severe allergy to any part of this vaccine, you should not get Tdap. Tell your doctor if you have any severe allergies.  If you had a coma, or long or multiple seizures within 7 days after a childhood dose of DTP or DTaP, you should not get Tdap, unless a cause other than the vaccine was found. You can still get Td.  Talk to your doctor if you:  have epilepsy or another nervous system problem,  had severe pain or swelling after any vaccine containing diphtheria, tetanus or pertussis,  ever had Guillain-Barr Syndrome (GBS),  aren't feeling well on the day the shot is scheduled. RISKS OF A VACCINE REACTION With any medicine, including vaccines, there is a chance of side effects. These are usually mild and go away on their own, but serious reactions are also possible. Brief fainting spells can follow a vaccination, leading to injuries from falling. Sitting or lying down for about 15 minutes can help prevent these. Tell your doctor if you feel dizzy or light-headed, or   have vision changes or ringing in the ears. Mild problems following Tdap (Did not interfere with activities)  Pain where the shot was given (about 3 in 4 adolescents or 2 in 3 adults)  Redness or swelling where the shot was given (about 1 person in 5)  Mild fever of at least 100.79F (up to about 1 in 25 adolescents or 1 in  100 adults)  Headache (about 3 or 4 people in 10)  Tiredness (about 1 person in 3 or 4)  Nausea, vomiting, diarrhea, stomach ache (up to 1 in 4 adolescents or 1 in 10 adults)  Chills, body aches, sore joints, rash, swollen glands (uncommon) Moderate problems following Tdap (Interfered with activities, but did not require medical attention)  Pain where the shot was given (about 1 in 5 adolescents or 1 in 100 adults)  Redness or swelling where the shot was given (up to about 1 in 16 adolescents or 1 in 25 adults)  Fever over 102F (about 1 in 100 adolescents or 1 in 250 adults)  Headache (about 3 in 20 adolescents or 1 in 10 adults)  Nausea, vomiting, diarrhea, stomach ache (up to 1 or 3 people in 100)  Swelling of the entire arm where the shot was given (up to about 3 in 100). Severe problems following Tdap (Unable to perform usual activities, required medical attention)  Swelling, severe pain, bleeding and redness in the arm where the shot was given (rare). A severe allergic reaction could occur after any vaccine (estimated less than 1 in a million doses). WHAT IF THERE IS A SERIOUS REACTION? What should I look for?  Look for anything that concerns you, such as signs of a severe allergic reaction, very high fever, or behavior changes. Signs of a severe allergic reaction can include hives, swelling of the face and throat, difficulty breathing, a fast heartbeat, dizziness, and weakness. These would start a few minutes to a few hours after the vaccination. What should I do?  If you think it is a severe allergic reaction or other emergency that can't wait, call 9-1-1 or get the person to the nearest hospital. Otherwise, call your doctor.  Afterward, the reaction should be reported to the "Vaccine Adverse Event Reporting System" (VAERS). Your doctor might file this report, or you can do it yourself through the VAERS web site at www.vaers.LAgents.no, or by calling 1-703-196-6159. VAERS is  only for reporting reactions. They do not give medical advice.  THE NATIONAL VACCINE INJURY COMPENSATION PROGRAM The National Vaccine Injury Compensation Program (VICP) is a federal program that was created to compensate people who may have been injured by certain vaccines. Persons who believe they may have been injured by a vaccine can learn about the program and about filing a claim by calling 1-(534)849-1897 or visiting the VICP website at SpiritualWord.at. HOW CAN I LEARN MORE?  Ask your doctor.  Call your local or state health department.  Contact the Centers for Disease Control and Prevention (CDC):  Call (904) 440-0342 or visit CDC's website at PicCapture.uy CDC Tdap Vaccine VIS (05/01/12) Document Released: 06/10/2012 Document Reviewed: 06/10/2012 [facility] Patient Information 2013 [place], [place]. Levonorgestrel intrauterine device (IUD) What is this medicine? LEVONORGESTREL IUD (LEE voe nor jes trel) is a contraceptive (birth control) device. The device is placed inside the uterus by a healthcare professional. It is used to prevent pregnancy and can also be used to treat heavy bleeding that occurs during your period. Depending on the device, it can be used for 3 to 5  years. This medicine may be used for other purposes; ask your health care provider or pharmacist if you have questions. What should I tell my health care provider before I take this medicine? They need to know if you have any of these conditions: -abnormal Pap smear -cancer of the breast, uterus, or cervix -diabetes -endometritis -genital or pelvic infection now or in the past -have more than one sexual partner or your partner has more than one partner -heart disease -history of an ectopic or tubal pregnancy -immune system problems -IUD in place -liver disease or tumor -problems with blood clots or take blood-thinners -use intravenous drugs -uterus of unusual shape -vaginal bleeding that  has not been explained -an unusual or allergic reaction to levonorgestrel, other hormones, silicone, or polyethylene, medicines, foods, dyes, or preservatives -pregnant or trying to get pregnant -breast-feeding How should I use this medicine? This device is placed inside the uterus by a health care professional. Talk to your pediatrician regarding the use of this medicine in children. Special care may be needed. Overdosage: If you think you have taken too much of this medicine contact a poison control center or emergency room at once. NOTE: This medicine is only for you. Do not share this medicine with others. What if I miss a dose? This does not apply. What may interact with this medicine? Do not take this medicine with any of the following medications: -amprenavir -bosentan -fosamprenavir This medicine may also interact with the following medications: -aprepitant -barbiturate medicines for inducing sleep or treating seizures -bexarotene -griseofulvin -medicines to treat seizures like carbamazepine, ethotoin, felbamate, oxcarbazepine, phenytoin, topiramate -modafinil -pioglitazone -rifabutin -rifampin -rifapentine -some medicines to treat HIV infection like atazanavir, indinavir, lopinavir, nelfinavir, tipranavir, ritonavir -St. John's wort -warfarin This list may not describe all possible interactions. Give your health care provider a list of all the medicines, herbs, non-prescription drugs, or dietary supplements you use. Also tell them if you smoke, drink alcohol, or use illegal drugs. Some items may interact with your medicine. What should I watch for while using this medicine? Visit your doctor or health care professional for regular check ups. See your doctor if you or your partner has sexual contact with others, becomes HIV positive, or gets a sexual transmitted disease. This product does not protect you against HIV infection (AIDS) or other sexually transmitted  diseases. You can check the placement of the IUD yourself by reaching up to the top of your vagina with clean fingers to feel the threads. Do not pull on the threads. It is a good habit to check placement after each menstrual period. Call your doctor right away if you feel more of the IUD than just the threads or if you cannot feel the threads at all. The IUD may come out by itself. You may become pregnant if the device comes out. If you notice that the IUD has come out use a backup birth control method like condoms and call your health care provider. Using tampons will not change the position of the IUD and are okay to use during your period. What side effects may I notice from receiving this medicine? Side effects that you should report to your doctor or health care professional as soon as possible: -allergic reactions like skin rash, itching or hives, swelling of the face, lips, or tongue -fever, flu-like symptoms -genital sores -high blood pressure -no menstrual period for 6 weeks during use -pain, swelling, warmth in the leg -pelvic pain or tenderness -severe or sudden headache -signs  of pregnancy -stomach cramping -sudden shortness of breath -trouble with balance, talking, or walking -unusual vaginal bleeding, discharge -yellowing of the eyes or skin Side effects that usually do not require medical attention (report to your doctor or health care professional if they continue or are bothersome): -acne -breast pain -change in sex drive or performance -changes in weight -cramping, dizziness, or faintness while the device is being inserted -headache -irregular menstrual bleeding within first 3 to 6 months of use -nausea This list may not describe all possible side effects. Call your doctor for medical advice about side effects. You may report side effects to FDA at 1-800-FDA-1088. Where should I keep my medicine? This does not apply. NOTE: This sheet is a summary. It may not cover  all possible information. If you have questions about this medicine, talk to your doctor, pharmacist, or health care provider.  2013, [employer]vier/Gold Standard. (01/10/2012 1:54:04 PM)

## 2013-04-01 NOTE — Progress Notes (Signed)
Pulse 111  

## 2013-04-01 NOTE — Progress Notes (Signed)
Doing well 

## 2013-04-16 ENCOUNTER — Ambulatory Visit (INDEPENDENT_AMBULATORY_CARE_PROVIDER_SITE_OTHER): Admitting: Obstetrics & Gynecology

## 2013-04-16 VITALS — BP 119/79 | Temp 98.3°F | Wt 194.8 lb

## 2013-04-16 DIAGNOSIS — Z3480 Encounter for supervision of other normal pregnancy, unspecified trimester: Secondary | ICD-10-CM

## 2013-04-16 DIAGNOSIS — Z3482 Encounter for supervision of other normal pregnancy, second trimester: Secondary | ICD-10-CM

## 2013-04-16 LAB — POCT URINALYSIS DIPSTICK
Bilirubin, UA: NEGATIVE
Ketones, UA: NEGATIVE
Protein, UA: NEGATIVE

## 2013-04-16 NOTE — Patient Instructions (Addendum)

## 2013-04-16 NOTE — Progress Notes (Signed)
Pulse: 106

## 2013-04-17 ENCOUNTER — Encounter: Payer: Self-pay | Admitting: Obstetrics & Gynecology

## 2013-04-17 NOTE — Progress Notes (Signed)
Doing well 

## 2013-04-29 ENCOUNTER — Encounter: Payer: Self-pay | Admitting: Obstetrics & Gynecology

## 2013-04-29 ENCOUNTER — Ambulatory Visit (INDEPENDENT_AMBULATORY_CARE_PROVIDER_SITE_OTHER): Admitting: Obstetrics & Gynecology

## 2013-04-29 VITALS — BP 124/84 | Temp 98.6°F | Wt 191.0 lb

## 2013-04-29 DIAGNOSIS — Z3480 Encounter for supervision of other normal pregnancy, unspecified trimester: Secondary | ICD-10-CM

## 2013-04-29 LAB — POCT URINALYSIS DIPSTICK
Bilirubin, UA: NEGATIVE
Leukocytes, UA: NEGATIVE
Nitrite, UA: NEGATIVE
Protein, UA: NEGATIVE
Urobilinogen, UA: NEGATIVE
pH, UA: 5

## 2013-04-29 MED ORDER — PROMETHAZINE HCL 25 MG RE SUPP: 25.0000 mg | Freq: Four times a day (QID) | RECTAL | Status: DC | PRN

## 2013-04-29 NOTE — Progress Notes (Signed)
?  Sciatica.  PT referral.

## 2013-04-29 NOTE — Patient Instructions (Addendum)
Levonorgestrel intrauterine device (IUD) What is this medicine? LEVONORGESTREL IUD (LEE voe nor jes trel) is a contraceptive (birth control) device. The device is placed inside the uterus by a healthcare professional. It is used to prevent pregnancy and can also be used to treat heavy bleeding that occurs during your period. Depending on the device, it can be used for 3 to 5 years. This medicine may be used for other purposes; ask your health care provider or pharmacist if you have questions. What should I tell my health care provider before I take this medicine? They need to know if you have any of these conditions: -abnormal Pap smear -cancer of the breast, uterus, or cervix -diabetes -endometritis -genital or pelvic infection now or in the past -have more than one sexual partner or your partner has more than one partner -heart disease -history of an ectopic or tubal pregnancy -immune system problems -IUD in place -liver disease or tumor -problems with blood clots or take blood-thinners -use intravenous drugs -uterus of unusual shape -vaginal bleeding that has not been explained -an unusual or allergic reaction to levonorgestrel, other hormones, silicone, or polyethylene, medicines, foods, dyes, or preservatives -pregnant or trying to get pregnant -breast-feeding How should I use this medicine? This device is placed inside the uterus by a health care professional. Talk to your pediatrician regarding the use of this medicine in children. Special care may be needed. Overdosage: If you think you have taken too much of this medicine contact a poison control center or emergency room at once. NOTE: This medicine is only for you. Do not share this medicine with others. What if I miss a dose? This does not apply. What may interact with this medicine? Do not take this medicine with any of the following medications: -amprenavir -bosentan -fosamprenavir This medicine may also interact with  the following medications: -aprepitant -barbiturate medicines for inducing sleep or treating seizures -bexarotene -griseofulvin -medicines to treat seizures like carbamazepine, ethotoin, felbamate, oxcarbazepine, phenytoin, topiramate -modafinil -pioglitazone -rifabutin -rifampin -rifapentine -some medicines to treat HIV infection like atazanavir, indinavir, lopinavir, nelfinavir, tipranavir, ritonavir -St. John's wort -warfarin This list may not describe all possible interactions. Give your health care provider a list of all the medicines, herbs, non-prescription drugs, or dietary supplements you use. Also tell them if you smoke, drink alcohol, or use illegal drugs. Some items may interact with your medicine. What should I watch for while using this medicine? Visit your doctor or health care professional for regular check ups. See your doctor if you or your partner has sexual contact with others, becomes HIV positive, or gets a sexual transmitted disease. This product does not protect you against HIV infection (AIDS) or other sexually transmitted diseases. You can check the placement of the IUD yourself by reaching up to the top of your vagina with clean fingers to feel the threads. Do not pull on the threads. It is a good habit to check placement after each menstrual period. Call your doctor right away if you feel more of the IUD than just the threads or if you cannot feel the threads at all. The IUD may come out by itself. You may become pregnant if the device comes out. If you notice that the IUD has come out use a backup birth control method like condoms and call your health care provider. Using tampons will not change the position of the IUD and are okay to use during your period. What side effects may I notice from receiving this medicine?   Side effects that you should report to your doctor or health care professional as soon as possible: -allergic reactions like skin rash, itching or  hives, swelling of the face, lips, or tongue -fever, flu-like symptoms -genital sores -high blood pressure -no menstrual period for 6 weeks during use -pain, swelling, warmth in the leg -pelvic pain or tenderness -severe or sudden headache -signs of pregnancy -stomach cramping -sudden shortness of breath -trouble with balance, talking, or walking -unusual vaginal bleeding, discharge -yellowing of the eyes or skin Side effects that usually do not require medical attention (report to your doctor or health care professional if they continue or are bothersome): -acne -breast pain -change in sex drive or performance -changes in weight -cramping, dizziness, or faintness while the device is being inserted -headache -irregular menstrual bleeding within first 3 to 6 months of use -nausea This list may not describe all possible side effects. Call your doctor for medical advice about side effects. You may report side effects to FDA at 1-800-FDA-1088. Where should I keep my medicine? This does not apply. NOTE: This sheet is a summary. It may not cover all possible information. If you have questions about this medicine, talk to your doctor, pharmacist, or health care provider.  2013, Elsevier/Gold Standard. (01/10/2012 1:54:04 PM) Sciatica Sciatica is pain, weakness, numbness, or tingling along the path of the sciatic nerve. The nerve starts in the lower back and runs down the back of each leg. The nerve controls the muscles in the lower leg and in the back of the knee, while also providing sensation to the back of the thigh, lower leg, and the sole of your foot. Sciatica is a symptom of another medical condition. For instance, nerve damage or certain conditions, such as a herniated disk or bone spur on the spine, pinch or put pressure on the sciatic nerve. This causes the pain, weakness, or other sensations normally associated with sciatica. Generally, sciatica only affects one side of the  body. CAUSES   Herniated or slipped disc.  Degenerative disk disease.  A pain disorder involving the narrow muscle in the buttocks (piriformis syndrome).  Pelvic injury or fracture.  Pregnancy.  Tumor (rare). SYMPTOMS  Symptoms can vary from mild to very severe. The symptoms usually travel from the low back to the buttocks and down the back of the leg. Symptoms can include:  Mild tingling or dull aches in the lower back, leg, or hip.  Numbness in the back of the calf or sole of the foot.  Burning sensations in the lower back, leg, or hip.  Sharp pains in the lower back, leg, or hip.  Leg weakness.  Severe back pain inhibiting movement. These symptoms may get worse with coughing, sneezing, laughing, or prolonged sitting or standing. Also, being overweight may worsen symptoms. DIAGNOSIS  Your caregiver will perform a physical exam to look for common symptoms of sciatica. He or she may ask you to do certain movements or activities that would trigger sciatic nerve pain. Other tests may be performed to find the cause of the sciatica. These may include:  Blood tests.  X-rays.  Imaging tests, such as an MRI or CT scan. TREATMENT  Treatment is directed at the cause of the sciatic pain. Sometimes, treatment is not necessary and the pain and discomfort goes away on its own. If treatment is needed, your caregiver may suggest:  Over-the-counter medicines to relieve pain.  Prescription medicines, such as anti-inflammatory medicine, muscle relaxants, or narcotics.  Applying heat or ice  to the painful area.  Steroid injections to lessen pain, irritation, and inflammation around the nerve.  Reducing activity during periods of pain.  Exercising and stretching to strengthen your abdomen and improve flexibility of your spine. Your caregiver may suggest losing weight if the extra weight makes the back pain worse.  Physical therapy.  Surgery to eliminate what is pressing or pinching  the nerve, such as a bone spur or part of a herniated disk. HOME CARE INSTRUCTIONS   Only take over-the-counter or prescription medicines for pain or discomfort as directed by your caregiver.  Apply ice to the affected area for 20 minutes, 3 4 times a day for the first 48 72 hours. Then try heat in the same way.  Exercise, stretch, or perform your usual activities if these do not aggravate your pain.  Attend physical therapy sessions as directed by your caregiver.  Keep all follow-up appointments as directed by your caregiver.  Do not wear high heels or shoes that do not provide proper support.  Check your mattress to see if it is too soft. A firm mattress may lessen your pain and discomfort. SEEK IMMEDIATE MEDICAL CARE IF:   You lose control of your bowel or bladder (incontinence).  You have increasing weakness in the lower back, pelvis, buttocks, or legs.  You have redness or swelling of your back.  You have a burning sensation when you urinate.  You have pain that gets worse when you lie down or awakens you at night.  Your pain is worse than you have experienced in the past.  Your pain is lasting longer than 4 weeks.  You are suddenly losing weight without reason. MAKE SURE YOU:  Understand these instructions.  Will watch your condition.  Will get help right away if you are not doing well or get worse. Document Released: 12/04/2001 Document Revised: 06/10/2012 Document Reviewed: 04/20/2012 [facility] -  Patient Information 2013 [place], [place].

## 2013-04-29 NOTE — Progress Notes (Signed)
Pulse-111 Nausea and vomiting

## 2013-05-08 ENCOUNTER — Encounter (HOSPITAL_COMMUNITY): Payer: Self-pay | Admitting: *Deleted

## 2013-05-08 ENCOUNTER — Inpatient Hospital Stay (HOSPITAL_COMMUNITY)
Admission: AD | Admit: 2013-05-08 | Discharge: 2013-05-09 | Disposition: A | Discharge status: Home / Self Care | Source: Ambulatory Visit | Attending: Obstetrics & Gynecology | Admitting: Obstetrics & Gynecology

## 2013-05-08 DIAGNOSIS — O99891 Other specified diseases and conditions complicating pregnancy: Secondary | ICD-10-CM | POA: Insufficient documentation

## 2013-05-08 DIAGNOSIS — O26893 Other specified pregnancy related conditions, third trimester: Secondary | ICD-10-CM

## 2013-05-08 DIAGNOSIS — R12 Heartburn: Secondary | ICD-10-CM | POA: Insufficient documentation

## 2013-05-08 MED ORDER — GI COCKTAIL ~~LOC~~
30.0000 mL | Freq: Once | ORAL | Status: AC
  Administered 2013-05-08: 30 mL via ORAL
  Filled 2013-05-08: qty 30

## 2013-05-08 NOTE — MAU Note (Signed)
PT SAYS SHE STARTED HURTING ALL DAY X2 WEEKS- HAS BEEN TAKING   PEPCID- DOESN'T WORK.

## 2013-05-08 NOTE — MAU Provider Note (Signed)
Chief Complaint:  No chief complaint on file.  First Provider Initiated Contact with Patient 05/08/13 2354     HPI: Susan Crawford is a 27 y.o. G2P0101 at [redacted]w[redacted]d who presents to maternity admissions reporting severe heartburn. No relief with TUMS or Pepcid. Denies contractions, leakage of fluid, headache, epigastric pain, vision changes or vaginal bleeding. Good fetal movement.   Past Medical History: Past Medical History  Diagnosis Date  . Asthma   . Preterm labor     Past obstetric history: OB History   Grav Para Term Preterm Abortions TAB SAB Ect Mult Living   2 1 1  0      1     # Outc Date GA Lbr Len/2nd Wgt Sex Del Anes PTL Lv   1 TRM 1/09 [redacted]w[redacted]d   F SVD EPI  Yes   2 CUR               Past Surgical History: Past Surgical History  Procedure Laterality Date  . Breast lumpectomy      Family History: Family History  Problem Relation Age of Onset  . Asthma Mother   . Hypertension Mother   . Hypertension Maternal Grandmother   . Asthma Maternal Grandmother   . Other Neg Hx     Social History: History  Substance Use Topics  . Smoking status: Never Smoker   . Smokeless tobacco: Never Used  . Alcohol Use: No     Comment: rare    Allergies: No Known Allergies  Meds:  Prescriptions prior to admission  Medication Sig Dispense Refill  . promethazine (PHENERGAN) 25 MG suppository Place 1 suppository (25 mg total) rectally every 6 (six) hours as needed for nausea.  20 each  2  . [DISCONTINUED] famotidine (PEPCID) 10 MG tablet Take 10 mg by mouth 2 (two) times daily.      Marland Kitchen acetaminophen (TYLENOL) 500 MG tablet Take 1,000 mg by mouth every 4 (four) hours as needed for pain.      Marland Kitchen metoCLOPramide (REGLAN) 10 MG tablet Take 1 tablet (10 mg total) by mouth 4 (four) times daily.  40 tablet  1  . Prenatal Vit-Fe Fumarate-FA (PRENATAL MULTIVITAMIN) TABS Take 1 tablet by mouth daily at 12 noon.        ROS: Pertinent findings in history of present illness.  Physical Exam   Blood pressure 103/63, pulse 93, resp. rate 20, height 5\' 5"  (1.651 m), weight 88.622 kg (195 lb 6 oz), last menstrual period 10/11/2012, unknown if currently breastfeeding. GENERAL: Well-developed, well-nourished female in no acute distress.  HEENT: normocephalic HEART: normal rate RESP: normal effort ABDOMEN: Soft, non-tender, gravid appropriate for gestational age. Negative Murphy sign. EXTREMITIES: Nontender, no edema NEURO: alert and oriented  FHT:  Baseline 120 , moderate variability, accelerations present, no decelerations Contractions: None   Labs: No results found for this or any previous visit (from the past 24 hour(s)).  Imaging:  No results found.  MAU Course: Heartburn improve significantly with GI cocktail. Protonix given.  Assessment: 1. Heartburn in pregnancy, third trimester    Plan: Discharge home Preterm labor precautions and fetal kick counts.     Follow-up Information   Follow up with [facility] On 05/14/2013.   Contact information:   [address] [place] [address] [phone]       Medication List    STOP taking these medications       famotidine 10 MG tablet  Commonly known as:  PEPCID  TAKE these medications       acetaminophen 500 MG tablet  Commonly known as:  TYLENOL  Take 1,000 mg by mouth every 4 (four) hours as needed for pain.     metoCLOPramide 10 MG tablet  Commonly known as:  REGLAN  Take 1 tablet (10 mg total) by mouth 4 (four) times daily.     omeprazole 20 MG capsule  Commonly known as:  PRILOSEC  Take 1 capsule (20 mg total) by mouth daily.     prenatal multivitamin Tabs  Take 1 tablet by mouth daily at 12 noon.     promethazine 25 MG suppository  Commonly known as:  PHENERGAN  Place 1 suppository (25 mg total) rectally every 6 (six) hours as needed for nausea.        [place], CNM 05/09/2013 12:29 AM

## 2013-05-09 ENCOUNTER — Encounter (HOSPITAL_COMMUNITY): Payer: Self-pay | Admitting: Advanced Practice Midwife

## 2013-05-09 DIAGNOSIS — O26899 Other specified pregnancy related conditions, unspecified trimester: Secondary | ICD-10-CM

## 2013-05-09 MED ORDER — PANTOPRAZOLE SODIUM 40 MG PO TBEC
40.0000 mg | DELAYED_RELEASE_TABLET | Freq: Once | ORAL | Status: AC
  Administered 2013-05-09: 40 mg via ORAL
  Filled 2013-05-09: qty 1

## 2013-05-09 MED ORDER — OMEPRAZOLE 20 MG PO CPDR: 20.0000 mg | DELAYED_RELEASE_CAPSULE | Freq: Every day | ORAL | Status: DC

## 2013-05-13 ENCOUNTER — Ambulatory Visit: Attending: Obstetrics & Gynecology | Admitting: Physical Therapy

## 2013-05-13 DIAGNOSIS — O99891 Other specified diseases and conditions complicating pregnancy: Secondary | ICD-10-CM | POA: Insufficient documentation

## 2013-05-13 DIAGNOSIS — M25559 Pain in unspecified hip: Secondary | ICD-10-CM | POA: Insufficient documentation

## 2013-05-13 DIAGNOSIS — IMO0001 Reserved for inherently not codable concepts without codable children: Secondary | ICD-10-CM | POA: Insufficient documentation

## 2013-05-14 ENCOUNTER — Encounter: Payer: Self-pay | Admitting: Obstetrics & Gynecology

## 2013-05-14 ENCOUNTER — Ambulatory Visit (INDEPENDENT_AMBULATORY_CARE_PROVIDER_SITE_OTHER): Admitting: Obstetrics & Gynecology

## 2013-05-14 VITALS — BP 134/89 | Temp 97.0°F | Wt 199.0 lb

## 2013-05-14 DIAGNOSIS — Z3483 Encounter for supervision of other normal pregnancy, third trimester: Secondary | ICD-10-CM

## 2013-05-14 DIAGNOSIS — Z3480 Encounter for supervision of other normal pregnancy, unspecified trimester: Secondary | ICD-10-CM

## 2013-05-14 LAB — POCT URINALYSIS DIPSTICK
Blood, UA: NEGATIVE
Glucose, UA: NEGATIVE
Nitrite, UA: NEGATIVE
Protein, UA: NEGATIVE
Urobilinogen, UA: NEGATIVE
pH, UA: 7

## 2013-05-14 NOTE — Progress Notes (Signed)
P 110 Patient reports she has sharp pains felt in her vagina daily with pressure.

## 2013-05-14 NOTE — Progress Notes (Signed)
Manual B/P - right arm - 122/76.  Left arm 122/78.

## 2013-05-14 NOTE — Progress Notes (Signed)
Doing well 

## 2013-05-15 ENCOUNTER — Encounter: Payer: Self-pay | Admitting: Obstetrics & Gynecology

## 2013-05-15 NOTE — Patient Instructions (Signed)

## 2013-05-22 ENCOUNTER — Ambulatory Visit (INDEPENDENT_AMBULATORY_CARE_PROVIDER_SITE_OTHER): Admitting: Obstetrics

## 2013-05-22 ENCOUNTER — Encounter: Payer: Self-pay | Admitting: Obstetrics

## 2013-05-22 ENCOUNTER — Encounter: Admitting: Obstetrics

## 2013-05-22 VITALS — BP 128/75 | Temp 98.0°F | Wt 196.4 lb

## 2013-05-22 DIAGNOSIS — Z3483 Encounter for supervision of other normal pregnancy, third trimester: Secondary | ICD-10-CM

## 2013-05-22 DIAGNOSIS — Z3480 Encounter for supervision of other normal pregnancy, unspecified trimester: Secondary | ICD-10-CM

## 2013-05-22 DIAGNOSIS — Z113 Encounter for screening for infections with a predominantly sexual mode of transmission: Secondary | ICD-10-CM

## 2013-05-22 LAB — POCT URINALYSIS DIPSTICK
Blood, UA: NEGATIVE
Protein, UA: NEGATIVE
Spec Grav, UA: 1.015
Urobilinogen, UA: NEGATIVE
pH, UA: 6

## 2013-05-22 NOTE — Progress Notes (Signed)
Pulse: 114 

## 2013-05-26 ENCOUNTER — Encounter: Payer: Self-pay | Admitting: Obstetrics & Gynecology

## 2013-05-26 ENCOUNTER — Encounter: Admitting: Physical Therapy

## 2013-05-27 ENCOUNTER — Ambulatory Visit (INDEPENDENT_AMBULATORY_CARE_PROVIDER_SITE_OTHER): Admitting: Obstetrics & Gynecology

## 2013-05-27 VITALS — BP 135/87 | Temp 97.8°F | Wt 200.6 lb

## 2013-05-27 DIAGNOSIS — Z3483 Encounter for supervision of other normal pregnancy, third trimester: Secondary | ICD-10-CM

## 2013-05-27 DIAGNOSIS — Z3480 Encounter for supervision of other normal pregnancy, unspecified trimester: Secondary | ICD-10-CM

## 2013-05-27 LAB — POCT URINALYSIS DIPSTICK
Bilirubin, UA: NEGATIVE
Blood, UA: NEGATIVE
Glucose, UA: NEGATIVE
Ketones, UA: NEGATIVE
pH, UA: 5

## 2013-05-27 NOTE — Progress Notes (Signed)
Pulse: 108

## 2013-05-28 ENCOUNTER — Encounter: Payer: Self-pay | Admitting: Obstetrics & Gynecology

## 2013-05-28 ENCOUNTER — Encounter: Admitting: Physical Therapy

## 2013-05-28 NOTE — Progress Notes (Signed)
Borderline B/P elevation.

## 2013-05-28 NOTE — Patient Instructions (Signed)

## 2013-06-01 ENCOUNTER — Inpatient Hospital Stay (HOSPITAL_COMMUNITY)
Admission: AD | Admit: 2013-06-01 | Discharge: 2013-06-01 | Disposition: A | Discharge status: Home / Self Care | Source: Ambulatory Visit | Attending: Obstetrics & Gynecology | Admitting: Obstetrics & Gynecology

## 2013-06-01 ENCOUNTER — Encounter (HOSPITAL_COMMUNITY): Payer: Self-pay | Admitting: *Deleted

## 2013-06-01 DIAGNOSIS — O4700 False labor before 37 completed weeks of gestation, unspecified trimester: Secondary | ICD-10-CM | POA: Insufficient documentation

## 2013-06-01 NOTE — MAU Note (Signed)
contractions 

## 2013-06-01 NOTE — MAU Note (Signed)
Dr. Tamela Oddi notified of pt.  RN will recheck pt and call MD.

## 2013-06-01 NOTE — MAU Note (Signed)
Dr. Tamela Oddi notified of pt SVE.  Order for D/C rec'd.

## 2013-06-02 ENCOUNTER — Encounter: Admitting: Rehabilitation

## 2013-06-03 ENCOUNTER — Ambulatory Visit (INDEPENDENT_AMBULATORY_CARE_PROVIDER_SITE_OTHER): Admitting: Obstetrics & Gynecology

## 2013-06-03 ENCOUNTER — Encounter: Payer: Self-pay | Admitting: Obstetrics & Gynecology

## 2013-06-03 VITALS — BP 122/83 | Temp 98.0°F | Wt 198.0 lb

## 2013-06-03 DIAGNOSIS — Z3480 Encounter for supervision of other normal pregnancy, unspecified trimester: Secondary | ICD-10-CM

## 2013-06-03 DIAGNOSIS — Z3483 Encounter for supervision of other normal pregnancy, third trimester: Secondary | ICD-10-CM

## 2013-06-03 LAB — POCT URINALYSIS DIPSTICK
Bilirubin, UA: NEGATIVE
Blood, UA: NEGATIVE
Glucose, UA: NEGATIVE
Nitrite, UA: NEGATIVE
Spec Grav, UA: 1.02

## 2013-06-03 NOTE — Progress Notes (Signed)
Doing well 

## 2013-06-03 NOTE — Patient Instructions (Signed)

## 2013-06-03 NOTE — Progress Notes (Signed)
P-103 Pt states she is having pressure and pain in lower abdomen in the evenings. Pt states she has about 2 contractions a day.

## 2013-06-04 ENCOUNTER — Inpatient Hospital Stay (HOSPITAL_COMMUNITY)
Admission: AD | Admit: 2013-06-04 | Discharge: 2013-06-04 | Disposition: A | Discharge status: Home / Self Care | Source: Ambulatory Visit | Attending: Obstetrics | Admitting: Obstetrics

## 2013-06-04 ENCOUNTER — Encounter: Admitting: Physical Therapy

## 2013-06-04 ENCOUNTER — Encounter (HOSPITAL_COMMUNITY): Payer: Self-pay

## 2013-06-04 DIAGNOSIS — O4700 False labor before 37 completed weeks of gestation, unspecified trimester: Secondary | ICD-10-CM | POA: Insufficient documentation

## 2013-06-04 DIAGNOSIS — O99891 Other specified diseases and conditions complicating pregnancy: Secondary | ICD-10-CM | POA: Insufficient documentation

## 2013-06-04 DIAGNOSIS — M545 Low back pain, unspecified: Secondary | ICD-10-CM | POA: Insufficient documentation

## 2013-06-04 DIAGNOSIS — M79609 Pain in unspecified limb: Secondary | ICD-10-CM | POA: Insufficient documentation

## 2013-06-04 DIAGNOSIS — M549 Dorsalgia, unspecified: Secondary | ICD-10-CM

## 2013-06-04 DIAGNOSIS — O9989 Other specified diseases and conditions complicating pregnancy, childbirth and the puerperium: Secondary | ICD-10-CM

## 2013-06-04 MED ORDER — OXYCODONE-ACETAMINOPHEN 5-325 MG PO TABS: 1.0000 | ORAL_TABLET | Freq: Every evening | ORAL | Status: DC | PRN

## 2013-06-04 NOTE — MAU Provider Note (Signed)
History     CSN: [id number]  Arrival date and time: 06/04/13 2107   First Provider Initiated Contact with Patient 06/04/13 2211      Chief Complaint  Patient presents with  . Pelvic Pain   HPI 27 y.o. G2P1001 at [redacted]w[redacted]d with lower back pain and right hip/leg pain that come and go. Has been present for a few weeks but worsening. Mainly she is bothered by not being able to get comfortable to sleep. Lack of sleep is interfering with her daily function. She is having contractions, mainly in her back, but no bleeding, loss of fluid, dysuria or vaginal discharge. Baby is moving normally. Some nausea.   Prenatal care at [facility], no complications this pregnancy or last.  OB History   Grav Para Term Preterm Abortions TAB SAB Ect Mult Living   2 1 1  0      1      Past Medical History  Diagnosis Date  . Asthma   . Preterm labor     Past Surgical History  Procedure Laterality Date  . Breast lumpectomy      Family History  Problem Relation Age of Onset  . Asthma Mother   . Hypertension Mother   . Hypertension Maternal Grandmother   . Asthma Maternal Grandmother   . Other Neg Hx     History  Substance Use Topics  . Smoking status: Never Smoker   . Smokeless tobacco: Never Used  . Alcohol Use: No     Comment: rare    Allergies: No Known Allergies  Prescriptions prior to admission  Medication Sig Dispense Refill  . acetaminophen (TYLENOL) 500 MG tablet Take 1,000 mg by mouth every 4 (four) hours as needed for pain.      Marland Kitchen omeprazole (PRILOSEC) 20 MG capsule Take 1 capsule (20 mg total) by mouth daily.  30 capsule  3  . Prenatal Vit-Fe Fumarate-FA (PRENATAL MULTIVITAMIN) TABS Take 1 tablet by mouth daily at 12 noon.      . promethazine (PHENERGAN) 25 MG suppository Place 1 suppository (25 mg total) rectally every 6 (six) hours as needed for nausea.  20 each  2    ROS  See HPI  Physical Exam   Blood pressure 118/72, pulse 100, temperature 98.4 F (36.9 C), temperature  source Oral, resp. rate 18, height 5\' 5"  (1.651 m), weight 91.445 kg (201 lb 9.6 oz), last menstrual period 10/11/2012.  Physical Exam  Constitutional: She is oriented to person, place, and time. She appears well-developed and well-nourished. No distress.  HENT:  Head: Normocephalic and atraumatic.  Eyes: Conjunctivae and EOM are normal.  Neck: Normal range of motion. Neck supple.  Cardiovascular: Normal rate, regular rhythm and normal heart sounds.   Respiratory: Effort normal and breath sounds normal. No respiratory distress.  GI: Soft. Bowel sounds are normal. There is no tenderness. There is no rebound and no guarding.  Musculoskeletal: Normal range of motion. She exhibits no edema and no tenderness.  No tenderness of hip, leg, lower back  Neurological: She is alert and oriented to person, place, and time.  Skin: Skin is warm and dry.  Psychiatric: She has a normal mood and affect.   Dilation: 2.5 Effacement (%): 50 Station: -2 Presentation: Vertex Exam by:: D Simpson RN   MAU Course  Procedures  FHTs;  135, moderate variability, accels present, no decels TOCO:  Occasional ctx (< 1 in 15 minutes)  Assessment and Plan  26 y.o. G2P1001 at [redacted]w[redacted]d with back pain -  Musculoskeletal - percocet for night-time pain so patient can sleep more comfortably. #10 given. Discussed with Dr. Clearance Coots. - Not in labor - no change in cervix since last check - Stable for discharge home - FHTs reactive. - Follow up at [facility] as scheduled.  Napoleon Form 06/04/2013, 10:23 PM

## 2013-06-04 NOTE — MAU Note (Signed)
Pt c/o in creased pelvic pressure all day long. Having some contractions as weel but not as uncomfortable as pelvic pressure. Good fetal movement reported. Denies SROM or bleeding.

## 2013-06-06 ENCOUNTER — Inpatient Hospital Stay (HOSPITAL_COMMUNITY)
Admission: AD | Admit: 2013-06-06 | Discharge: 2013-06-06 | Disposition: A | Discharge status: Home / Self Care | Source: Ambulatory Visit | Attending: Obstetrics & Gynecology | Admitting: Obstetrics & Gynecology

## 2013-06-06 DIAGNOSIS — O479 False labor, unspecified: Secondary | ICD-10-CM | POA: Insufficient documentation

## 2013-06-06 NOTE — MAU Note (Signed)
Pt states she was having contractions Q 5 min at home, but now they have almost stopped

## 2013-06-10 ENCOUNTER — Encounter (HOSPITAL_COMMUNITY): Payer: Self-pay | Admitting: *Deleted

## 2013-06-10 ENCOUNTER — Inpatient Hospital Stay (HOSPITAL_COMMUNITY)
Admission: AD | Admit: 2013-06-10 | Discharge: 2013-06-10 | Disposition: A | Discharge status: Home / Self Care | Source: Ambulatory Visit | Attending: Obstetrics & Gynecology | Admitting: Obstetrics & Gynecology

## 2013-06-10 ENCOUNTER — Ambulatory Visit (INDEPENDENT_AMBULATORY_CARE_PROVIDER_SITE_OTHER): Admitting: Obstetrics & Gynecology

## 2013-06-10 VITALS — BP 128/96 | Temp 98.5°F | Wt 202.0 lb

## 2013-06-10 DIAGNOSIS — O99891 Other specified diseases and conditions complicating pregnancy: Secondary | ICD-10-CM | POA: Insufficient documentation

## 2013-06-10 DIAGNOSIS — R030 Elevated blood-pressure reading, without diagnosis of hypertension: Secondary | ICD-10-CM | POA: Insufficient documentation

## 2013-06-10 DIAGNOSIS — Z3480 Encounter for supervision of other normal pregnancy, unspecified trimester: Secondary | ICD-10-CM

## 2013-06-10 LAB — COMPREHENSIVE METABOLIC PANEL
BUN: 6 mg/dL (ref 6–23)
CO2: 21 mEq/L (ref 19–32)
Chloride: 102 mEq/L (ref 96–112)
Creatinine, Ser: 0.65 mg/dL (ref 0.50–1.10)
GFR calc Af Amer: >90 mL/min (ref >90)
GFR calc non Af Amer: >90 mL/min (ref >90)
Glucose, Bld: 91 mg/dL (ref 70–99)
Total Bilirubin: 0.3 mg/dL (ref 0.3–1.2)

## 2013-06-10 LAB — POCT URINALYSIS DIPSTICK
Blood, UA: NEGATIVE
Glucose, UA: NEGATIVE
Ketones, UA: NEGATIVE
Spec Grav, UA: 1.025

## 2013-06-10 LAB — CBC
HCT: 29.3 % — ABNORMAL LOW (ref 36.0–46.0)
Hemoglobin: 8.9 g/dL — ABNORMAL LOW (ref 12.0–15.0)
MCH: 24 pg — ABNORMAL LOW (ref 26.0–34.0)
MCHC: 30.4 g/dL (ref 30.0–36.0)
MCV: 79 fL (ref 78.0–100.0)
Platelets: 245 10*3/uL (ref 150–400)
RBC: 3.71 MIL/uL — ABNORMAL LOW (ref 3.87–5.11)
RDW: 15.7 % — ABNORMAL HIGH (ref 11.5–15.5)
WBC: 7.4 10*3/uL (ref 4.0–10.5)

## 2013-06-10 LAB — URIC ACID: Uric Acid, Serum: 6.8 mg/dL (ref 2.4–7.0)

## 2013-06-10 LAB — LACTATE DEHYDROGENASE: LDH: 263 U/L — ABNORMAL HIGH (ref 94–250)

## 2013-06-10 NOTE — MAU Note (Signed)
Pt was seen by Dr. Clearance Coots today not at the clinic.

## 2013-06-10 NOTE — Progress Notes (Signed)
P  93 Patient c/o increasing pressure. Patient c/o greenish discharge.

## 2013-06-10 NOTE — MAU Note (Signed)
Pt sent from clinic here in hospital and was told to come to MAU for elevated BP today.  No vaginal bleeding or ROM.  Good Fetal Movement.

## 2013-06-10 NOTE — MAU Provider Note (Signed)
  History     CSN: [id number]  Arrival date and time: 06/10/13 1320   None     Chief Complaint  Patient presents with  . Hypertension   HPI Susan Crawford is 27 y.o. G2P1001 [redacted]w[redacted]d weeks presenting for PIH workup.  Was seen in the office today by Dr. Clearance Coots. Blood pressures were high.  States her blood pressure has been up and down during this pregnancy.  Has been higher last few visits per patient report.  Is not taking medications for it at this time.  + fetal movement.  Denies vaginal bleeding or leaking of fluid.  Denies headache.  Has numbness and swelling in her hands/feet.  Denies chest pain.  Lab results are pending.    Past Medical History  Diagnosis Date  . Asthma   . Preterm labor     Past Surgical History  Procedure Laterality Date  . Breast lumpectomy      Family History  Problem Relation Age of Onset  . Asthma Mother   . Hypertension Mother   . Hypertension Maternal Grandmother   . Asthma Maternal Grandmother   . Other Neg Hx     History  Substance Use Topics  . Smoking status: Never Smoker   . Smokeless tobacco: Never Used  . Alcohol Use: No     Comment: rare    Allergies: No Known Allergies  Prescriptions prior to admission  Medication Sig Dispense Refill  . acetaminophen (TYLENOL) 500 MG tablet Take 1,000 mg by mouth every 4 (four) hours as needed for pain.      Marland Kitchen omeprazole (PRILOSEC) 20 MG capsule Take 1 capsule (20 mg total) by mouth daily.  30 capsule  3  . oxyCODONE-acetaminophen (PERCOCET/ROXICET) 5-325 MG per tablet Take 1 tablet by mouth at bedtime as needed for pain.  10 tablet  0  . Prenatal Vit-Fe Fumarate-FA (PRENATAL MULTIVITAMIN) TABS Take 1 tablet by mouth daily at 12 noon.      . promethazine (PHENERGAN) 25 MG suppository Place 1 suppository (25 mg total) rectally every 6 (six) hours as needed for nausea.  20 each  2    ROS Physical Exam   Blood pressure 122/83, pulse 92, temperature 97.8 F (36.6 C), temperature source Oral,  resp. rate 18, last menstrual period 10/11/2012.  Physical Exam  MAU Course  Procedures  MDM Labs reported to Dr. Tamela Oddi by Lawson Fiscal, RN  Assessment and Plan  Per Dr. Shellia Carwin M 06/10/2013, 2:50 PM

## 2013-06-11 LAB — WET PREP BY MOLECULAR PROBE: Candida species: NEGATIVE

## 2013-06-12 ENCOUNTER — Other Ambulatory Visit: Payer: Self-pay | Admitting: Obstetrics & Gynecology

## 2013-06-12 NOTE — Progress Notes (Signed)
Doing well 

## 2013-06-12 NOTE — Telephone Encounter (Signed)
Please respond

## 2013-06-12 NOTE — Patient Instructions (Signed)

## 2013-06-16 NOTE — Telephone Encounter (Signed)
Ok per Dr. Clearance Coots

## 2013-06-17 ENCOUNTER — Ambulatory Visit (INDEPENDENT_AMBULATORY_CARE_PROVIDER_SITE_OTHER): Admitting: Obstetrics & Gynecology

## 2013-06-17 ENCOUNTER — Encounter: Payer: Self-pay | Admitting: Obstetrics & Gynecology

## 2013-06-17 VITALS — BP 131/93 | Temp 98.3°F | Wt 204.2 lb

## 2013-06-17 DIAGNOSIS — O139 Gestational [pregnancy-induced] hypertension without significant proteinuria, unspecified trimester: Secondary | ICD-10-CM

## 2013-06-17 DIAGNOSIS — Z3480 Encounter for supervision of other normal pregnancy, unspecified trimester: Secondary | ICD-10-CM

## 2013-06-17 DIAGNOSIS — R030 Elevated blood-pressure reading, without diagnosis of hypertension: Secondary | ICD-10-CM

## 2013-06-17 DIAGNOSIS — Z3483 Encounter for supervision of other normal pregnancy, third trimester: Secondary | ICD-10-CM

## 2013-06-17 DIAGNOSIS — O133 Gestational [pregnancy-induced] hypertension without significant proteinuria, third trimester: Secondary | ICD-10-CM

## 2013-06-17 LAB — POCT URINALYSIS DIPSTICK
Glucose, UA: NEGATIVE
Ketones, UA: NEGATIVE

## 2013-06-17 NOTE — Progress Notes (Signed)
Pulse- 111.  Slight swelling in hands.

## 2013-06-17 NOTE — Patient Instructions (Addendum)

## 2013-06-17 NOTE — Progress Notes (Signed)
Repeat BP 120/80

## 2013-06-18 ENCOUNTER — Encounter: Payer: Self-pay | Admitting: Obstetrics & Gynecology

## 2013-06-19 ENCOUNTER — Inpatient Hospital Stay (HOSPITAL_COMMUNITY): Admitting: Anesthesiology

## 2013-06-19 ENCOUNTER — Encounter (HOSPITAL_COMMUNITY): Payer: Self-pay | Admitting: *Deleted

## 2013-06-19 ENCOUNTER — Encounter (HOSPITAL_COMMUNITY): Payer: Self-pay | Admitting: Anesthesiology

## 2013-06-19 ENCOUNTER — Inpatient Hospital Stay (HOSPITAL_COMMUNITY)
Admission: AD | Admit: 2013-06-19 | Discharge: 2013-06-21 | DRG: 775 | Disposition: A | Discharge status: Home / Self Care | Source: Ambulatory Visit | Attending: Obstetrics & Gynecology | Admitting: Obstetrics & Gynecology

## 2013-06-19 DIAGNOSIS — O9903 Anemia complicating the puerperium: Principal | ICD-10-CM | POA: Diagnosis not present

## 2013-06-19 DIAGNOSIS — D6489 Other specified anemias: Secondary | ICD-10-CM | POA: Diagnosis not present

## 2013-06-19 DIAGNOSIS — O9081 Anemia of the puerperium: Secondary | ICD-10-CM | POA: Diagnosis present

## 2013-06-19 LAB — CBC
HCT: 28.7 % — ABNORMAL LOW (ref 36.0–46.0)
Hemoglobin: 8.8 g/dL — ABNORMAL LOW (ref 12.0–15.0)
Hemoglobin: 9.1 g/dL — ABNORMAL LOW (ref 12.0–15.0)
MCH: 23.7 pg — ABNORMAL LOW (ref 26.0–34.0)
MCHC: 30.7 g/dL (ref 30.0–36.0)
MCV: 77.2 fL — ABNORMAL LOW (ref 78.0–100.0)
RBC: 3.72 MIL/uL — ABNORMAL LOW (ref 3.87–5.11)
RBC: 3.8 MIL/uL — ABNORMAL LOW (ref 3.87–5.11)
WBC: 11.9 10*3/uL — ABNORMAL HIGH (ref 4.0–10.5)

## 2013-06-19 LAB — TYPE AND SCREEN

## 2013-06-19 MED ORDER — LIDOCAINE HCL (PF) 1 % IJ SOLN
INTRAMUSCULAR | Status: DC | PRN
  Administered 2013-06-19 (×2): 5 mL

## 2013-06-19 MED ORDER — ACETAMINOPHEN 325 MG PO TABS
650.0000 mg | ORAL_TABLET | ORAL | Status: DC | PRN
  Administered 2013-06-19: 650 mg via ORAL
  Filled 2013-06-19: qty 2

## 2013-06-19 MED ORDER — SENNOSIDES-DOCUSATE SODIUM 8.6-50 MG PO TABS
2.0000 | ORAL_TABLET | Freq: Every day | ORAL | Status: DC
  Administered 2013-06-19 – 2013-06-20 (×2): 2 via ORAL

## 2013-06-19 MED ORDER — BUTORPHANOL TARTRATE 1 MG/ML IJ SOLN: 1.0000 mg | INTRAMUSCULAR | Status: DC | PRN

## 2013-06-19 MED ORDER — NALBUPHINE HCL 10 MG/ML IJ SOLN: 2.5000 mg | INTRAMUSCULAR | Status: DC | PRN

## 2013-06-19 MED ORDER — OXYCODONE-ACETAMINOPHEN 5-325 MG PO TABS
1.0000 | ORAL_TABLET | ORAL | Status: DC | PRN
  Administered 2013-06-19 – 2013-06-21 (×3): 1 via ORAL
  Filled 2013-06-19 (×3): qty 1

## 2013-06-19 MED ORDER — PRENATAL MULTIVITAMIN CH
1.0000 | ORAL_TABLET | Freq: Every day | ORAL | Status: DC
  Administered 2013-06-20 – 2013-06-21 (×2): 1 via ORAL
  Filled 2013-06-19 (×2): qty 1

## 2013-06-19 MED ORDER — NALBUPHINE SYRINGE 5 MG/0.5 ML: 2.5000 mg | INJECTION | Freq: Once | INTRAMUSCULAR | Status: DC

## 2013-06-19 MED ORDER — DIPHENHYDRAMINE HCL 50 MG/ML IJ SOLN
12.5000 mg | INTRAMUSCULAR | Status: DC | PRN
  Administered 2013-06-19: 12.5 mg via INTRAVENOUS
  Filled 2013-06-19: qty 1

## 2013-06-19 MED ORDER — WITCH HAZEL-GLYCERIN EX PADS: 1.0000 "application " | MEDICATED_PAD | CUTANEOUS | Status: DC | PRN

## 2013-06-19 MED ORDER — BENZOCAINE-MENTHOL 20-0.5 % EX AERO: 1.0000 "application " | INHALATION_SPRAY | CUTANEOUS | Status: DC | PRN

## 2013-06-19 MED ORDER — OXYCODONE-ACETAMINOPHEN 5-325 MG PO TABS: 1.0000 | ORAL_TABLET | ORAL | Status: DC | PRN

## 2013-06-19 MED ORDER — CITRIC ACID-SODIUM CITRATE 334-500 MG/5ML PO SOLN: 30.0000 mL | ORAL | Status: DC | PRN

## 2013-06-19 MED ORDER — LACTATED RINGERS IV SOLN
INTRAVENOUS | Status: DC
  Administered 2013-06-19: 02:00:00 via INTRAVENOUS

## 2013-06-19 MED ORDER — TETANUS-DIPHTH-ACELL PERTUSSIS 5-2.5-18.5 LF-MCG/0.5 IM SUSP
0.5000 mL | Freq: Once | INTRAMUSCULAR | Status: AC
  Administered 2013-06-20: 0.5 mL via INTRAMUSCULAR

## 2013-06-19 MED ORDER — PANTOPRAZOLE SODIUM 40 MG PO TBEC
40.0000 mg | DELAYED_RELEASE_TABLET | Freq: Every day | ORAL | Status: DC
  Administered 2013-06-19 – 2013-06-21 (×3): 40 mg via ORAL
  Filled 2013-06-19 (×3): qty 1

## 2013-06-19 MED ORDER — ONDANSETRON HCL 4 MG PO TABS: 4.0000 mg | ORAL_TABLET | ORAL | Status: DC | PRN

## 2013-06-19 MED ORDER — IBUPROFEN 600 MG PO TABS: 600.0000 mg | ORAL_TABLET | Freq: Four times a day (QID) | ORAL | Status: DC | PRN

## 2013-06-19 MED ORDER — LANOLIN HYDROUS EX OINT: TOPICAL_OINTMENT | CUTANEOUS | Status: DC | PRN

## 2013-06-19 MED ORDER — MEDROXYPROGESTERONE ACETATE 150 MG/ML IM SUSP: 150.0000 mg | INTRAMUSCULAR | Status: DC | PRN

## 2013-06-19 MED ORDER — DIBUCAINE 1 % RE OINT: 1.0000 "application " | TOPICAL_OINTMENT | RECTAL | Status: DC | PRN

## 2013-06-19 MED ORDER — OXYTOCIN 40 UNITS IN LACTATED RINGERS INFUSION - SIMPLE MED: 62.5000 mL/h | INTRAVENOUS | Status: DC | PRN

## 2013-06-19 MED ORDER — LIDOCAINE HCL (PF) 1 % IJ SOLN
30.0000 mL | INTRAMUSCULAR | Status: DC | PRN
Start: 2013-06-19 — End: 2013-06-19
  Filled 2013-06-19 (×2): qty 30

## 2013-06-19 MED ORDER — TERBUTALINE SULFATE 1 MG/ML IJ SOLN: 0.2500 mg | Freq: Once | INTRAMUSCULAR | Status: DC | PRN

## 2013-06-19 MED ORDER — FENTANYL 2.5 MCG/ML BUPIVACAINE 1/10 % EPIDURAL INFUSION (WH - ANES)
14.0000 mL/h | INTRAMUSCULAR | Status: DC | PRN
  Administered 2013-06-19: 14 mL/h via EPIDURAL
  Filled 2013-06-19 (×2): qty 125

## 2013-06-19 MED ORDER — LACTATED RINGERS IV SOLN
500.0000 mL | Freq: Once | INTRAVENOUS | Status: AC
  Administered 2013-06-19: 500 mL via INTRAVENOUS

## 2013-06-19 MED ORDER — LACTATED RINGERS IV SOLN: 500.0000 mL | INTRAVENOUS | Status: DC | PRN

## 2013-06-19 MED ORDER — OXYTOCIN 40 UNITS IN LACTATED RINGERS INFUSION - SIMPLE MED
1.0000 m[IU]/min | INTRAVENOUS | Status: DC
  Administered 2013-06-19: 1 m[IU]/min via INTRAVENOUS

## 2013-06-19 MED ORDER — ZOLPIDEM TARTRATE 5 MG PO TABS: 5.0000 mg | ORAL_TABLET | Freq: Every evening | ORAL | Status: DC | PRN

## 2013-06-19 MED ORDER — PHENYLEPHRINE 40 MCG/ML (10ML) SYRINGE FOR IV PUSH (FOR BLOOD PRESSURE SUPPORT)
80.0000 ug | PREFILLED_SYRINGE | INTRAVENOUS | Status: DC | PRN
  Filled 2013-06-19: qty 5
  Filled 2013-06-19: qty 2

## 2013-06-19 MED ORDER — ONDANSETRON HCL 4 MG/2ML IJ SOLN: 4.0000 mg | INTRAMUSCULAR | Status: DC | PRN

## 2013-06-19 MED ORDER — NALBUPHINE SYRINGE 5 MG/0.5 ML
2.5000 mg | INJECTION | Freq: Once | INTRAMUSCULAR | Status: AC
  Administered 2013-06-19: 10:00:00 via INTRAVENOUS

## 2013-06-19 MED ORDER — FLEET ENEMA 7-19 GM/118ML RE ENEM: 1.0000 | ENEMA | RECTAL | Status: DC | PRN

## 2013-06-19 MED ORDER — EPHEDRINE 5 MG/ML INJ
10.0000 mg | INTRAVENOUS | Status: DC | PRN
  Filled 2013-06-19: qty 4
  Filled 2013-06-19: qty 2

## 2013-06-19 MED ORDER — SIMETHICONE 80 MG PO CHEW: 80.0000 mg | CHEWABLE_TABLET | ORAL | Status: DC | PRN

## 2013-06-19 MED ORDER — OXYTOCIN BOLUS FROM INFUSION: 500.0000 mL | INTRAVENOUS | Status: DC

## 2013-06-19 MED ORDER — EPHEDRINE 5 MG/ML INJ
10.0000 mg | INTRAVENOUS | Status: DC | PRN
  Filled 2013-06-19: qty 2

## 2013-06-19 MED ORDER — PHENYLEPHRINE 40 MCG/ML (10ML) SYRINGE FOR IV PUSH (FOR BLOOD PRESSURE SUPPORT)
80.0000 ug | PREFILLED_SYRINGE | INTRAVENOUS | Status: DC | PRN
  Filled 2013-06-19: qty 2

## 2013-06-19 MED ORDER — NALBUPHINE SYRINGE 5 MG/0.5 ML
2.5000 mg | INJECTION | INTRAMUSCULAR | Status: DC | PRN
  Administered 2013-06-19: 2.5 mg via INTRAVENOUS
  Filled 2013-06-19 (×2): qty 0.5

## 2013-06-19 MED ORDER — IBUPROFEN 600 MG PO TABS
600.0000 mg | ORAL_TABLET | Freq: Four times a day (QID) | ORAL | Status: DC
  Administered 2013-06-19 – 2013-06-21 (×7): 600 mg via ORAL
  Filled 2013-06-19 (×8): qty 1

## 2013-06-19 MED ORDER — ONDANSETRON HCL 4 MG/2ML IJ SOLN
4.0000 mg | Freq: Four times a day (QID) | INTRAMUSCULAR | Status: DC | PRN
  Administered 2013-06-19: 4 mg via INTRAVENOUS
  Filled 2013-06-19: qty 2

## 2013-06-19 MED ORDER — DIPHENHYDRAMINE HCL 25 MG PO CAPS: 25.0000 mg | ORAL_CAPSULE | Freq: Four times a day (QID) | ORAL | Status: DC | PRN

## 2013-06-19 MED ORDER — OXYTOCIN 40 UNITS IN LACTATED RINGERS INFUSION - SIMPLE MED
62.5000 mL/h | INTRAVENOUS | Status: DC
  Filled 2013-06-19: qty 1000

## 2013-06-19 NOTE — MAU Provider Note (Signed)
  History     CSN: [id number]  Arrival date and time: 06/19/13 0016   None     Chief Complaint  Patient presents with  . Back Pain   Back Pain Associated symptoms include headaches. Pertinent negatives include no abdominal pain, chest pain, dysuria or fever.    Susan Crawford is a 27 y.o. G2P1001 at [redacted]w[redacted]d who presents today with back pain, pressure and leg pain. She states she is having contractions, but only occasionally. She has been feeling the baby move normally. She denies any LOF or VB. She states that her labor with her first baby she had only mild contractions, but was dilating quickly.    Past Medical History  Diagnosis Date  . Asthma   . Preterm labor     Past Surgical History  Procedure Laterality Date  . Breast lumpectomy      Family History  Problem Relation Age of Onset  . Asthma Mother   . Hypertension Mother   . Hypertension Maternal Grandmother   . Asthma Maternal Grandmother   . Other Neg Hx     History  Substance Use Topics  . Smoking status: Never Smoker   . Smokeless tobacco: Never Used  . Alcohol Use: No     Comment: rare    Allergies: No Known Allergies  Prescriptions prior to admission  Medication Sig Dispense Refill  . acetaminophen (TYLENOL) 500 MG tablet Take 1,000 mg by mouth every 4 (four) hours as needed for pain.      Marland Kitchen omeprazole (PRILOSEC) 20 MG capsule Take 1 capsule (20 mg total) by mouth daily.  30 capsule  3  . oxyCODONE-acetaminophen (PERCOCET/ROXICET) 5-325 MG per tablet Take 1 tablet by mouth at bedtime as needed for pain.  10 tablet  0  . promethazine (PHENERGAN) 25 MG suppository PLACE 1 SUPPOSITORY (25 MG TOTAL) RECTALLY EVERY 6 (SIX) HOURS AS NEEDED FOR NAUSEA.  14 suppository  2    Review of Systems  Constitutional: Negative for fever and chills.  Eyes: Negative for blurred vision.  Respiratory: Negative for shortness of breath.   Cardiovascular: Negative for chest pain.  Gastrointestinal: Positive for nausea.  Negative for vomiting, abdominal pain, diarrhea and constipation.  Genitourinary: Negative for dysuria, urgency and frequency.  Musculoskeletal: Positive for back pain. Negative for myalgias.  Neurological: Positive for headaches. Negative for dizziness.   Physical Exam   Blood pressure 118/70, pulse 82, temperature 98.1 F (36.7 C), temperature source Oral, resp. rate 16, last menstrual period 10/11/2012.  Physical Exam  Nursing note and vitals reviewed. Constitutional: She is oriented to person, place, and time. She appears well-developed and well-nourished. No distress.  Cardiovascular: Normal rate.   Respiratory: Effort normal.  GI: Soft. There is no tenderness.  Neurological: She is alert and oriented to person, place, and time.  Skin: Skin is warm and dry.  Psychiatric: She has a normal mood and affect.   Cervix: 4/60/-1 FHT: Cat I Toco: difficult to trace. She had 3 contractions while in the room, moaning through them.  MAU Course  Procedures    Assessment and Plan  Labor check Care turned over to RN to manage as labor.   Tawnya Crook 06/19/2013, 12:50 AM

## 2013-06-19 NOTE — Anesthesia Preprocedure Evaluation (Addendum)
Anesthesia Evaluation  Patient identified by MRN, date of birth, ID band Patient awake    Reviewed: Allergy & Precautions, H&P , Patient's Chart, lab work & pertinent test results  Airway Mallampati: II TM Distance: >3 FB Neck ROM: full    Dental no notable dental hx.    Pulmonary neg pulmonary ROS, asthma ,  breath sounds clear to auscultation  Pulmonary exam normal       Cardiovascular hypertension, negative cardio ROS  Rhythm:regular Rate:Normal     Neuro/Psych negative neurological ROS  negative psych ROS   GI/Hepatic negative GI ROS, Neg liver ROS,   Endo/Other  negative endocrine ROS  Renal/GU negative Renal ROS     Musculoskeletal   Abdominal   Peds  Hematology negative hematology ROS (+)   Anesthesia Other Findings   Reproductive/Obstetrics (+) Pregnancy                          Anesthesia Physical Anesthesia Plan  ASA: III  Anesthesia Plan: Epidural   Post-op Pain Management:    Induction:   Airway Management Planned:   Additional Equipment:   Intra-op Plan:   Post-operative Plan:   Informed Consent: I have reviewed the patients History and Physical, chart, labs and discussed the procedure including the risks, benefits and alternatives for the proposed anesthesia with the patient or authorized representative who has indicated his/her understanding and acceptance.     Plan Discussed with:   Anesthesia Plan Comments:        Anesthesia Quick Evaluation

## 2013-06-19 NOTE — MAU Note (Signed)
Pt states she has always had back pain but it has been really bad today

## 2013-06-19 NOTE — Anesthesia Procedure Notes (Signed)
Epidural Patient location during procedure: OB Start time: 06/19/2013 5:57 AM  Staffing Anesthesiologist: Angus Seller., Harrell Gave. Performed by: anesthesiologist   Preanesthetic Checklist Completed: patient identified, site marked, surgical consent, pre-op evaluation, timeout performed, IV checked, risks and benefits discussed and monitors and equipment checked  Epidural Patient position: sitting Prep: site prepped and draped and DuraPrep Patient monitoring: continuous pulse ox and blood pressure Approach: midline Injection technique: LOR air and LOR saline  Needle:  Needle type: Tuohy  Needle gauge: 17 G Needle length: 9 cm and 9 Needle insertion depth: 6 cm Catheter type: closed end flexible Catheter size: 19 Gauge Catheter at skin depth: 11 cm Test dose: negative  Assessment Events: blood not aspirated, injection not painful, no injection resistance, negative IV test and no paresthesia  Additional Notes Patient identified.  Risk benefits discussed including failed block, incomplete pain control, headache, nerve damage, paralysis, blood pressure changes, nausea, vomiting, reactions to medication both toxic or allergic, and postpartum back pain.  Patient expressed understanding and wished to proceed.  All questions were answered.  Sterile technique used throughout procedure and epidural site dressed with sterile barrier dressing. No paresthesia or other complications noted.The patient did not experience any signs of intravascular injection such as tinnitus or metallic taste in mouth nor signs of intrathecal spread such as rapid motor block. Please see nursing notes for vital signs.

## 2013-06-19 NOTE — Anesthesia Postprocedure Evaluation (Signed)
  Anesthesia Post-op Note  Patient: Susan Crawford  Procedure(s) Performed: * No procedures listed *  Patient Location: PACU and Mother/Baby   Anesthesia Type:Epidural  Level of Consciousness: awake, alert , oriented and patient cooperative  Airway and Oxygen Therapy: Patient Spontanous Breathing  Post-op Pain: none  Post-op Assessment: Post-op Vital signs reviewed and No signs of Nausea or vomiting  Post-op Vital Signs: Reviewed and stable  Complications: No apparent anesthesia complications

## 2013-06-19 NOTE — H&P (Signed)
This is Dr. Francoise Ceo dictating the history and physical on  Susan Crawford she's a 27 year old gravida 2 para 1001 negative GBS at 20 weeks EDC 06/26/2013 she is admitted in labor she's no 7 cm 80% vertex -1 amniotomy performed the fluids clear Past medical history negative Past surgical history negative Social history negative System review noncontributory Physical exam well-developed female in labor HEENT negative Lungs clear to P&A Heart regular with no murmurs no gallops Breasts negative Abdomen term Pelvic as described above Extremities negative

## 2013-06-20 DIAGNOSIS — O9081 Anemia of the puerperium: Secondary | ICD-10-CM | POA: Diagnosis present

## 2013-06-20 LAB — CBC
HCT: 25.5 % — ABNORMAL LOW (ref 36.0–46.0)
Hemoglobin: 7.8 g/dL — ABNORMAL LOW (ref 12.0–15.0)
MCV: 76.8 fL — ABNORMAL LOW (ref 78.0–100.0)
Platelets: 191 10*3/uL (ref 150–400)
RBC: 3.32 MIL/uL — ABNORMAL LOW (ref 3.87–5.11)
WBC: 15.6 10*3/uL — ABNORMAL HIGH (ref 4.0–10.5)

## 2013-06-20 NOTE — Progress Notes (Signed)
Patient ID: Susan Crawford, female   DOB: [birth year], 27 y.o.   MRN: [id number] Post Partum Day 1 S/P spontaneous vaginal RH status/Rubella reviewed.  Feeding: unknown Subjective: No HA, SOB, CP, F/C, breast symptoms. Normal vaginal bleeding, no clots.     Objective: BP 134/78  Pulse 83  Temp(Src) 98.5 F (36.9 C) (Oral)  Resp 20  Ht 5\' 5"  (1.651 m)  Wt 205 lb (92.987 kg)  BMI 34.11 kg/m2  SpO2 100%  LMP 10/11/2012   Physical Exam:  General: alert Lochia: inappropriate Uterine Fundus: firm DVT Evaluation: No evidence of DVT seen on physical exam. Ext: No c/c/e  Recent Labs  06/19/13 1610 06/20/13 0615  HGB 9.1* 7.8*  HCT 29.5* 25.5*      Assessment/Plan: 27 y.o.  PPD #1 .  normal postpartum exam; anemia stable Continue current postpartum care Ambulate   LOS: 1 day   JACKSON-MOORE,Krishna Dancel A 06/20/2013, 11:43 AM

## 2013-06-21 MED ORDER — NORETHINDRONE 0.35 MG PO TABS: 1.0000 | ORAL_TABLET | Freq: Every day | ORAL | Status: DC

## 2013-06-21 MED ORDER — FERROUS SULFATE 325 (65 FE) MG PO TABS: 325.0000 mg | ORAL_TABLET | Freq: Two times a day (BID) | ORAL | Status: DC

## 2013-06-21 MED ORDER — OXYCODONE-ACETAMINOPHEN 5-325 MG PO TABS: 1.0000 | ORAL_TABLET | ORAL | Status: DC | PRN

## 2013-06-21 NOTE — Discharge Summary (Signed)
  Obstetric Discharge Summary Reason for Admission: onset of labor Prenatal Procedures: none Intrapartum Procedures: spontaneous vaginal delivery Postpartum Procedures: none Complications-Operative and Postpartum: none  Hemoglobin  Date Value Range Status  06/20/2013 7.8* 12.0 - 15.0 g/dL Final  16/09/9603 54.0   Final     HCT  Date Value Range Status  06/20/2013 25.5* 36.0 - 46.0 % Final  12/05/2012 35   Final    Physical Exam:  General: alert Lochia: appropriate Uterine: firm Incision: n/a DVT Evaluation: No evidence of DVT seen on physical exam.  Discharge Diagnoses: Active Problems:   Anemia, postpartum   Normal delivery   Discharge Information: Date: 06/21/2013 Activity: pelvic rest Diet: routine Medications:  Prior to Admission medications   Medication Sig Start Date End Date Taking? Authorizing Provider  ferrous sulfate 325 (65 FE) MG tablet Take 1 tablet (325 mg total) by mouth 2 (two) times daily before a meal. 06/21/13   Antionette Char, MD  norethindrone (ORTHO MICRONOR) 0.35 MG tablet Take 1 tablet (0.35 mg total) by mouth daily. 06/21/13   Antionette Char, MD  oxyCODONE-acetaminophen (PERCOCET/ROXICET) 5-325 MG per tablet Take 1-2 tablets by mouth every 4 (four) hours as needed. 06/21/13   Antionette Char, MD    Condition: stable Instructions: refer to routine discharge instructions Discharge to: home Follow-up Information   Follow up with Antionette Char A, MD. Schedule an appointment as soon as possible for a visit in 6 weeks.   Contact information:   [address] [place] [address] [phone]       Newborn Data: Live born  Information for the patient's newborn:  Azbell, Boy Gayle [[id number]]  female ; APGAR (1 MIN): 9   APGAR (5 MINS): 9   Home with mother.  JACKSON-MOORE,Kaimen Peine A 06/21/2013, 11:38 AM

## 2013-06-24 ENCOUNTER — Encounter: Admitting: Obstetrics & Gynecology

## 2013-07-02 ENCOUNTER — Ambulatory Visit (INDEPENDENT_AMBULATORY_CARE_PROVIDER_SITE_OTHER): Admitting: Obstetrics & Gynecology

## 2013-07-02 ENCOUNTER — Encounter: Payer: Self-pay | Admitting: Obstetrics & Gynecology

## 2013-07-02 NOTE — Progress Notes (Deleted)
  Subjective:    Patient ID: Susan Crawford, female    DOB: February 23, 1986, 27 y.o.   MRN: [id number]  HPI    Review of Systems     Objective:   Physical Exam        Assessment & Plan:

## 2013-07-02 NOTE — Patient Instructions (Signed)

## 2013-07-02 NOTE — Progress Notes (Signed)
Subjective:     Susan Crawford is a 27 y.o. female who presents for a postpartum visit. She is 2 weeks postpartum following a spontaneous vaginal delivery. I have fully reviewed the prenatal and intrapartum course. The delivery was at 39 gestational weeks. Outcome: spontaneous vaginal delivery. Anesthesia: epidural. Postpartum course has been with lower back pain. Baby's course has been normal. Baby is feeding by breast. Bleeding thin lochia. Bowel function is abnormal: constipation. Bladder function is normal. Patient is not sexually active. Contraception method is abstinence. Postpartum depression screening: negative.  The following portions of the patient's history were reviewed and updated as appropriate: allergies, current medications, past family history, past medical history, past social history, past surgical history and problem list.  Review of Systems Pertinent items are noted in HPI.   Objective:    BP 139/94  Pulse 76  Temp(Src) 98.3 F (36.8 C) (Oral)  Wt 178 lb (80.74 kg)  BMI 29.62 kg/m2  Breastfeeding? Yes        Assessment:     Doing well postpartum   Plan:     Contraception: oral progesterone-only contraceptive Follow up in: 4 weeks or as needed.

## 2013-08-06 ENCOUNTER — Ambulatory Visit: Admitting: Obstetrics & Gynecology

## 2013-08-06 ENCOUNTER — Encounter: Payer: Self-pay | Admitting: Obstetrics & Gynecology

## 2013-08-06 ENCOUNTER — Ambulatory Visit (INDEPENDENT_AMBULATORY_CARE_PROVIDER_SITE_OTHER): Admitting: Obstetrics & Gynecology

## 2013-08-06 NOTE — Progress Notes (Signed)
.   Subjective:     Susan Crawford is a 27 y.o. female who presents for a postpartum visit. She is 6 weeks postpartum following a spontaneous vaginal delivery. I have fully reviewed the prenatal and intrapartum course. The delivery was at 39 gestational weeks. Outcome: spontaneous vaginal delivery. Anesthesia: epidural. Postpartum course has been normal. Baby's course has been normal. Baby is feeding by breast. Bleeding no bleeding. Bowel function is abnormal: constipation. Bladder function is normal. Patient is not sexually active. Contraception method is none. Postpartum depression screening: negative.  The following portions of the patient's history were reviewed and updated as appropriate: allergies, current medications, past family history, past medical history, past social history, past surgical history and problem list.  Review of Systems Pertinent items are noted in HPI.   Objective:    BP 121/86  Pulse 76  Temp(Src) 98.6 F (37 C) (Oral)  Ht 5\' 5"  (1.651 m)  Wt 166 lb (75.297 kg)  BMI 27.62 kg/m2        General:  alert     Abdomen: soft, non-tender; bowel sounds normal; no masses,  no organomegaly   Vulva:  normal  Vagina: Thin, white discharge  Cervix:  no lesions  Corpus: normal size, contour, position, consistency, mobility, non-tender  Adnexa:  normal adnexa   Assessment:     Normal postpartum exam.   Plan:    Plans Skyla IUD.  Return for insertion.

## 2013-08-06 NOTE — Patient Instructions (Signed)
Intrauterine Device Information  An intrauterine device (IUD) is inserted into your uterus and prevents pregnancy. There are 2 types of IUDs available:  · Copper IUD. This type of IUD is wrapped in copper wire and is placed inside the uterus. Copper makes the uterus and fallopian tubes produce a fluid that kills sperm. The copper IUD can stay in place for 10 years.  · Hormone IUD. This type of IUD contains the hormone progestin (synthetic progesterone). The hormone thickens the cervical mucus and prevents sperm from entering the uterus, and it also thins the uterine lining to prevent implantation of a fertilized egg. The hormone can weaken or kill the sperm that get into the uterus. The hormone IUD can stay in place for 5 years.  Your caregiver will make sure you are a good candidate for a contraceptive IUD. Discuss with your caregiver the possible side effects.  ADVANTAGES  · It is highly effective, reversible, long-acting, and low maintenance.  · There are no estrogen-related side effects.  · An IUD can be used when breastfeeding.  · It is not associated with weight gain.  · It works immediately after insertion.  · The copper IUD does not interfere with your female hormones.  · The progesterone IUD can make heavy menstrual periods lighter.  · The progesterone IUD can be used for 5 years.  · The copper IUD can be used for 10 years.  DISADVANTAGES  · The progesterone IUD can be associated with irregular bleeding patterns.  · The copper IUD can make your menstrual flow heavier and more painful.  · You may experience cramping and vaginal bleeding after insertion.  Document Released: 11/13/2004 Document Revised: 03/03/2012 Document Reviewed: 04/14/2011  [employer]® Patient Information ©2014 [employer].

## 2013-08-25 ENCOUNTER — Encounter: Payer: Self-pay | Admitting: Obstetrics & Gynecology

## 2013-09-10 ENCOUNTER — Emergency Department (INDEPENDENT_AMBULATORY_CARE_PROVIDER_SITE_OTHER)
Admission: EM | Admit: 2013-09-10 | Discharge: 2013-09-10 | Disposition: A | Discharge status: Home / Self Care | Source: Home / Self Care | Attending: Family Medicine | Admitting: Family Medicine

## 2013-09-10 ENCOUNTER — Emergency Department (INDEPENDENT_AMBULATORY_CARE_PROVIDER_SITE_OTHER)

## 2013-09-10 ENCOUNTER — Encounter (HOSPITAL_COMMUNITY): Payer: Self-pay | Admitting: Emergency Medicine

## 2013-09-10 DIAGNOSIS — M7040 Prepatellar bursitis, unspecified knee: Secondary | ICD-10-CM

## 2013-09-10 DIAGNOSIS — M7041 Prepatellar bursitis, right knee: Secondary | ICD-10-CM

## 2013-09-10 MED ORDER — CEPHALEXIN 500 MG PO CAPS: 500.0000 mg | ORAL_CAPSULE | Freq: Three times a day (TID) | ORAL | Status: DC

## 2013-09-10 MED ORDER — IBUPROFEN 600 MG PO TABS: 600.0000 mg | ORAL_TABLET | Freq: Three times a day (TID) | ORAL | Status: DC

## 2013-09-10 MED ORDER — TRAMADOL HCL 50 MG PO TABS: 50.0000 mg | ORAL_TABLET | Freq: Four times a day (QID) | ORAL | Status: DC | PRN

## 2013-09-10 NOTE — ED Provider Notes (Signed)
CSN: [id number]     Arrival date & time 09/10/13  1011 History   First MD Initiated Contact with Patient 09/10/13 1125     Chief Complaint  Patient presents with  . Knee Injury   (Consider location/radiation/quality/duration/timing/severity/associated sxs/prior Treatment) HPI Comments: 27 year old female 2 months postpartum. Comes complaining of right knee pain and swelling. Patient states that she tripped with a side walk and fell landing on her right knee 2 weeks ago. She had a small scratch the skin which is healed now. Right knee still hurting anteriorly with bending only. Denies pain with weightbearing or walking. Denies fever or chills. Has taken Tylenol with no significant improvement.   Past Medical History  Diagnosis Date  . Asthma   . Preterm labor    Past Surgical History  Procedure Laterality Date  . Breast lumpectomy     Family History  Problem Relation Age of Onset  . Asthma Mother   . Hypertension Mother   . Hypertension Maternal Grandmother   . Asthma Maternal Grandmother   . Other Neg Hx    History  Substance Use Topics  . Smoking status: Never Smoker   . Smokeless tobacco: Never Used  . Alcohol Use: No     Comment: rare   OB History   Grav Para Term Preterm Abortions TAB SAB Ect Mult Living   2 2 2  0      2     Review of Systems  Constitutional: Negative for fever, chills and fatigue.  Eyes: Negative for redness.  Genitourinary: Negative for dysuria.  Musculoskeletal: Negative for arthralgias.       Swelling on top of her right knee as per HPI.  Skin: Negative for rash and wound.    Allergies  Review of patient's allergies indicates no known allergies.  Home Medications   Current Outpatient Rx  Name  Route  Sig  Dispense  Refill  . cephALEXin (KEFLEX) 500 MG capsule   Oral   Take 1 capsule (500 mg total) by mouth 3 (three) times daily.   21 capsule   0   . ferrous sulfate 325 (65 FE) MG tablet   Oral   Take 1 tablet (325 mg total) by  mouth 2 (two) times daily before a meal.   60 tablet   11   . ibuprofen (ADVIL,MOTRIN) 600 MG tablet   Oral   Take 1 tablet (600 mg total) by mouth 3 (three) times daily. Take with meals   30 tablet   0   . norethindrone (ORTHO MICRONOR) 0.35 MG tablet   Oral   Take 1 tablet (0.35 mg total) by mouth daily.   28 tablet   11   . traMADol (ULTRAM) 50 MG tablet   Oral   Take 1 tablet (50 mg total) by mouth every 6 (six) hours as needed for pain.   15 tablet   0    BP 121/79  Pulse 86  Temp(Src) 97.5 F (36.4 C) (Oral)  Resp 14  SpO2 98%  Breastfeeding? Yes Physical Exam  Nursing note and vitals reviewed. Constitutional: She is oriented to person, place, and time. She appears well-developed and well-nourished. No distress.  Neck: Neck supple.  Cardiovascular: Normal heart sounds.   Pulmonary/Chest: Breath sounds normal.  Musculoskeletal: Normal range of motion.  Right knee: There is mild fluctuant no tender swelling between knee cap and skin. Impress mild increased temp compared with right knee. No erythema. No pain with joint motion appears FROM. Negative McMurrays,  and drawer test, no laxity. Weight bearing. Discomfort only at the top of the patella with maximal knee flexion. No wounds, dry peeling superficial scabs on the skin above bursa.  Lymphadenopathy:    She has no cervical adenopathy.  Neurological: She is alert and oriented to person, place, and time.  Skin: No rash noted. She is not diaphoretic.    ED Course  Procedures (including critical care time) Labs Review Labs Reviewed - No data to display Imaging Review Dg Knee Complete 4 Views Right  09/10/2013   CLINICAL DATA:  Right knee pain.  EXAM: RIGHT KNEE - COMPLETE 4+ VIEW  COMPARISON:  None.  FINDINGS: There is no evidence of fracture, dislocation, or joint effusion. There is no evidence of arthropathy or other focal bone abnormality. Soft tissues are unremarkable.  IMPRESSION: Negative.   Electronically  Signed   By: Drusilla Kanner M.D.   On: 09/10/2013 12:12    MDM   1. Prepatellar bursitis, right   Small fluid collection between patella and skin. treated with knee sleeve and NSAID (ibuprofen) for 10 days. Does not appear infected but mildly increased temp decided to cover with keflex. Orthopedic referral as needed.     Sharin Grave, MD 09/13/13 [phone]

## 2013-09-10 NOTE — ED Notes (Signed)
Pt c/o right knee pain onset Sunday Reports she was walking when she tripped onto sidewalk Sxs include: swelling and pain that increases when she bends her knee Taking tyle w/no relief.... Alert w/no signs of acute distress.

## 2013-09-23 ENCOUNTER — Ambulatory Visit (INDEPENDENT_AMBULATORY_CARE_PROVIDER_SITE_OTHER): Admitting: Obstetrics & Gynecology

## 2013-09-23 ENCOUNTER — Encounter: Payer: Self-pay | Admitting: Obstetrics & Gynecology

## 2013-09-23 VITALS — BP 129/90 | HR 83 | Ht 65.0 in | Wt 164.0 lb

## 2013-09-23 DIAGNOSIS — Z3202 Encounter for pregnancy test, result negative: Secondary | ICD-10-CM

## 2013-09-23 DIAGNOSIS — Z30430 Encounter for insertion of intrauterine contraceptive device: Secondary | ICD-10-CM

## 2013-09-23 LAB — POCT URINE PREGNANCY: Preg Test, Ur: NEGATIVE

## 2013-09-23 NOTE — Progress Notes (Signed)
IUD Insertion Procedure Note  Pre-operative Diagnosis: Contraception  Post-operative Diagnosis: same  Indications: Contraception  Procedure Details  Urine pregnancy test was done  and result was negative.  The risks (including infection, bleeding, pain, and uterine perforation) and benefits of the procedure were explained to the patient and Written informed consent was obtained.    Cervix cleansed with Betadine. Uterus sounded to 7 cm. IUD inserted without difficulty. String visible and trimmed. Patient tolerated procedure well.  An informal U/S confirmed appropriate positioning.  IUD Information: Skyla.  Condition: Stable  Complications: None  Plan:  The patient was advised to call for any fever or for prolonged or severe pain or bleeding. She was advised to use OTC analgesics as needed for mild to moderate pain.

## 2013-09-23 NOTE — Patient Instructions (Signed)
Intrauterine Device Information  An intrauterine device (IUD) is inserted into your uterus and prevents pregnancy. There are 2 types of IUDs available:  · Copper IUD. This type of IUD is wrapped in copper wire and is placed inside the uterus. Copper makes the uterus and fallopian tubes produce a fluid that kills sperm. The copper IUD can stay in place for 10 years.  · Hormone IUD. This type of IUD contains the hormone progestin (synthetic progesterone). The hormone thickens the cervical mucus and prevents sperm from entering the uterus, and it also thins the uterine lining to prevent implantation of a fertilized egg. The hormone can weaken or kill the sperm that get into the uterus. The hormone IUD can stay in place for 5 years.  Your caregiver will make sure you are a good candidate for a contraceptive IUD. Discuss with your caregiver the possible side effects.  ADVANTAGES  · It is highly effective, reversible, long-acting, and low maintenance.  · There are no estrogen-related side effects.  · An IUD can be used when breastfeeding.  · It is not associated with weight gain.  · It works immediately after insertion.  · The copper IUD does not interfere with your female hormones.  · The progesterone IUD can make heavy menstrual periods lighter.  · The progesterone IUD can be used for 5 years.  · The copper IUD can be used for 10 years.  DISADVANTAGES  · The progesterone IUD can be associated with irregular bleeding patterns.  · The copper IUD can make your menstrual flow heavier and more painful.  · You may experience cramping and vaginal bleeding after insertion.  Document Released: 11/13/2004 Document Revised: 03/03/2012 Document Reviewed: 04/14/2011  [employer]® Patient Information ©2014 [employer].

## 2013-10-14 ENCOUNTER — Emergency Department (HOSPITAL_COMMUNITY)

## 2013-10-14 ENCOUNTER — Emergency Department (HOSPITAL_COMMUNITY)
Admission: EM | Admit: 2013-10-14 | Discharge: 2013-10-14 | Disposition: A | Discharge status: Home / Self Care | Attending: Emergency Medicine | Admitting: Emergency Medicine

## 2013-10-14 ENCOUNTER — Encounter (HOSPITAL_COMMUNITY): Payer: Self-pay | Admitting: Emergency Medicine

## 2013-10-14 DIAGNOSIS — Z79899 Other long term (current) drug therapy: Secondary | ICD-10-CM | POA: Insufficient documentation

## 2013-10-14 DIAGNOSIS — N23 Unspecified renal colic: Secondary | ICD-10-CM | POA: Insufficient documentation

## 2013-10-14 DIAGNOSIS — J45909 Unspecified asthma, uncomplicated: Secondary | ICD-10-CM | POA: Insufficient documentation

## 2013-10-14 DIAGNOSIS — Z3202 Encounter for pregnancy test, result negative: Secondary | ICD-10-CM | POA: Insufficient documentation

## 2013-10-14 DIAGNOSIS — Z8751 Personal history of pre-term labor: Secondary | ICD-10-CM | POA: Insufficient documentation

## 2013-10-14 DIAGNOSIS — N201 Calculus of ureter: Secondary | ICD-10-CM | POA: Insufficient documentation

## 2013-10-14 LAB — URINALYSIS, ROUTINE W REFLEX MICROSCOPIC
Bilirubin Urine: NEGATIVE
Ketones, ur: NEGATIVE mg/dL
Nitrite: NEGATIVE
Protein, ur: 30 mg/dL — AB
Specific Gravity, Urine: 1.023 (ref 1.005–1.030)
Urobilinogen, UA: 0.2 mg/dL (ref 0.0–1.0)

## 2013-10-14 LAB — URINE MICROSCOPIC-ADD ON

## 2013-10-14 LAB — PREGNANCY, URINE: Preg Test, Ur: NEGATIVE

## 2013-10-14 MED ORDER — NAPROXEN 250 MG PO TABS: 250.0000 mg | ORAL_TABLET | Freq: Two times a day (BID) | ORAL | Status: DC

## 2013-10-14 MED ORDER — ONDANSETRON HCL 4 MG PO TABS: 4.0000 mg | ORAL_TABLET | Freq: Three times a day (TID) | ORAL | Status: DC | PRN

## 2013-10-14 MED ORDER — ONDANSETRON HCL 4 MG/2ML IJ SOLN
4.0000 mg | INTRAMUSCULAR | Status: DC | PRN
  Administered 2013-10-14: 4 mg via INTRAVENOUS
  Filled 2013-10-14: qty 2

## 2013-10-14 MED ORDER — DIAZEPAM 5 MG PO TABS
5.0000 mg | ORAL_TABLET | Freq: Once | ORAL | Status: AC
  Administered 2013-10-14: 5 mg via ORAL
  Filled 2013-10-14: qty 1

## 2013-10-14 MED ORDER — MORPHINE SULFATE 4 MG/ML IJ SOLN
4.0000 mg | INTRAMUSCULAR | Status: DC | PRN
  Administered 2013-10-14: 4 mg via INTRAVENOUS
  Filled 2013-10-14 (×2): qty 1

## 2013-10-14 MED ORDER — KETOROLAC TROMETHAMINE 30 MG/ML IJ SOLN
30.0000 mg | Freq: Once | INTRAMUSCULAR | Status: AC
  Administered 2013-10-14: 30 mg via INTRAVENOUS
  Filled 2013-10-14: qty 1

## 2013-10-14 MED ORDER — OXYCODONE-ACETAMINOPHEN 5-325 MG PO TABS
2.0000 | ORAL_TABLET | Freq: Once | ORAL | Status: AC
  Administered 2013-10-14: 2 via ORAL
  Filled 2013-10-14: qty 2

## 2013-10-14 MED ORDER — OXYCODONE-ACETAMINOPHEN 5-325 MG PO TABS: ORAL_TABLET | ORAL | Status: DC

## 2013-10-14 MED ORDER — TAMSULOSIN HCL 0.4 MG PO CAPS: 0.4000 mg | ORAL_CAPSULE | Freq: Every day | ORAL | Status: DC

## 2013-10-14 NOTE — ED Notes (Signed)
Pt requesting ice chips. Given to pt. Informed pt she was not able to get anything for pain until 1419. Pt asking when she would be going over for her CT scan. Informed they would not do the scan until her urine pregnancy came back.

## 2013-10-14 NOTE — ED Notes (Signed)
Pt reports pain is increasing and wants more pain medicine. MD notified.

## 2013-10-14 NOTE — ED Provider Notes (Signed)
CSN: [id number]     Arrival date & time 10/14/13  1252 History   First MD Initiated Contact with Patient 10/14/13 1301     Chief Complaint  Patient presents with  . Flank Pain    HPI Pt was seen at 1300. Per pt, c/o sudden onset and persistence of waxing and waning left sided flank "pain" that began this morning PTA.  Pt describes the pain as "sharp," and radiating into the left side of her abd.  Has been associated with multiple intermittent episodes of N/V.  Denies vaginal discharge, no dysuria/hematuria, no abd pain, no diarrhea, no black or blood in emesis, no CP/SOB, no fevers.     Past Medical History  Diagnosis Date  . Asthma   . Preterm labor    Past Surgical History  Procedure Laterality Date  . Breast lumpectomy     Family History  Problem Relation Age of Onset  . Asthma Mother   . Hypertension Mother   . Hypertension Maternal Grandmother   . Asthma Maternal Grandmother   . Other Neg Hx    History  Substance Use Topics  . Smoking status: Never Smoker   . Smokeless tobacco: Never Used  . Alcohol Use: No     Comment: rare   OB History   Grav Para Term Preterm Abortions TAB SAB Ect Mult Living   2 2 2  0      2     Review of Systems ROS: Statement: All systems negative except as marked or noted in the HPI; Constitutional: Negative for fever and chills. ; ; Eyes: Negative for eye pain, redness and discharge. ; ; ENMT: Negative for ear pain, hoarseness, nasal congestion, sinus pressure and sore throat. ; ; Cardiovascular: Negative for chest pain, palpitations, diaphoresis, dyspnea and peripheral edema. ; ; Respiratory: Negative for cough, wheezing and stridor. ; ; Gastrointestinal: Negative for nausea, vomiting, diarrhea, abdominal pain, blood in stool, hematemesis, jaundice and rectal bleeding. . ; ; Genitourinary: Negative for dysuria and hematuria. ; ; GYN:  No vaginal bleeding, no vaginal discharge, no vulvar pain.;;  Musculoskeletal: +LBP. Negative for neck pain.  Negative for swelling and trauma.; ; Skin: Negative for pruritus, rash, abrasions, blisters, bruising and skin lesion.; ; Neuro: Negative for headache, lightheadedness and neck stiffness. Negative for weakness, altered level of consciousness , altered mental status, extremity weakness, paresthesias, involuntary movement, seizure and syncope.       Allergies  Review of patient's allergies indicates no known allergies.  Home Medications   Current Outpatient Rx  Name  Route  Sig  Dispense  Refill  . FENUGREEK PO   Oral   Take 3 capsules by mouth 3 (three) times daily.         Marland Kitchen ibuprofen (ADVIL,MOTRIN) 600 MG tablet   Oral   Take 600 mg by mouth every 8 (eight) hours as needed for pain.          BP 131/86  Pulse 102  Temp(Src) 97.8 F (36.6 C) (Oral)  Resp 20  Wt 164 lb (74.39 kg)  BMI 27.29 kg/m2  SpO2 100% Physical Exam 1305: Physical examination:  Nursing notes reviewed; Vital signs and O2 SAT reviewed;  Constitutional: Well developed, Well nourished, Well hydrated, Uncomfortable appearing; Head:  Normocephalic, atraumatic; Eyes: EOMI, PERRL, No scleral icterus; ENMT: Mouth and pharynx normal, Mucous membranes moist; Neck: Supple, Full range of motion, No lymphadenopathy; Cardiovascular: Regular rate and rhythm, No murmur, rub, or gallop; Respiratory: Breath sounds clear & equal bilaterally,  No rales, rhonchi, wheezes.  Speaking full sentences with ease, Normal respiratory effort/excursion; Chest: Nontender, Movement normal; Abdomen: Soft, Nontender, Nondistended, Normal bowel sounds; Genitourinary: No CVA tenderness; Spine:  No midline CS, TS, LS tenderness. +TTP left lumbar paraspinal muscles.;;  Extremities: Pulses normal, No tenderness, No edema, No calf edema or asymmetry.; Neuro: AA&Ox3, Major CN grossly intact.  Speech clear. Climbs on and off stretcher easily by herself. Gait steady.  No gross focal motor or sensory deficits in extremities.; Skin: Color normal, Warm,  Dry.   ED Course  Procedures     EKG Interpretation   None       MDM  MDM Reviewed: previous chart, nursing note and vitals Reviewed previous: labs Interpretation: labs and CT scan   Results for orders placed during the hospital encounter of 10/14/13  PREGNANCY, URINE      Result Value Range   Preg Test, Ur NEGATIVE  NEGATIVE  URINALYSIS, ROUTINE W REFLEX MICROSCOPIC      Result Value Range   Color, Urine YELLOW  YELLOW   APPearance CLOUDY (*) CLEAR   Specific Gravity, Urine 1.023  1.005 - 1.030   pH 8.0  5.0 - 8.0   Glucose, UA NEGATIVE  NEGATIVE mg/dL   Hgb urine dipstick NEGATIVE  NEGATIVE   Bilirubin Urine NEGATIVE  NEGATIVE   Ketones, ur NEGATIVE  NEGATIVE mg/dL   Protein, ur 30 (*) NEGATIVE mg/dL   Urobilinogen, UA 0.2  0.0 - 1.0 mg/dL   Nitrite NEGATIVE  NEGATIVE   Leukocytes, UA NEGATIVE  NEGATIVE  URINE MICROSCOPIC-ADD ON      Result Value Range   Squamous Epithelial / LPF RARE  RARE   WBC, UA 0-2  <3 WBC/hpf   RBC / HPF 0-2  <3 RBC/hpf   Bacteria, UA RARE  RARE   Urine-Other AMORPHOUS URATES/PHOSPHATES     Ct Abdomen Pelvis Wo Contrast 10/14/2013   CLINICAL DATA:  Flank pain  EXAM: CT ABDOMEN AND PELVIS WITHOUT CONTRAST  TECHNIQUE: Multidetector CT imaging of the abdomen and pelvis was performed following the standard protocol without oral or intravenous contrast.  COMPARISON:  None.  FINDINGS: Lung bases are clear.  The liver is enlarged, measuring 18.6 cm in length. No focal liver lesions are identified on this noncontrast enhanced study. There is no biliary duct dilatation.  Spleen, pancreas, and adrenals appear normal.  Right kidney appears normal without mass, calculus, or hydronephrosis. There is no ureteral calculus or ureterectasis on the right.  On the left, there is moderate generalized hydronephrosis. There is no intrarenal calculus or mass the left. There is ureterectasis on the left.  There is a 2 mm calculus in the distal left ureter near the  ureterovesical junction seen on slice 74. No other ureteral calculi are appreciable. Slightly lateral to this calculus, there is a phlebolith in the pelvis.  In the pelvis, there is an intrauterine device within the uterus. There is no pelvic mass or fluid collection. Appendix appears normal.  There is no bowel obstruction. No free air or portal venous air.  There is no ascites, adenopathy, or abscess in the abdomen or pelvis. There is postoperative change at the level of the emboli kiss. No hernia is seen.  Aorta is nonaneurysmal. There are no blastic or lytic bone lesions.  IMPRESSION: 2 mm calculus distal left ureter near the ureterovesical junction causing moderate hydronephrosis on the left.  There is no bowel obstruction. No mesenteric inflammation or abscess. Appendix appears normal.  The enlarged  liver. No focal liver lesions identified on this noncontrast enhanced study.   Electronically Signed   By: Bretta Bang M.D.   On: 10/14/2013 15:23    1610:  Pt states she feels better now and wants to go home. Has tol PO well without N/V. Dx and testing d/w pt and family.  Questions answered.  Verb understanding, agreeable to d/c home with outpt f/u.      Laray Anger, DO 10/17/13 1141

## 2013-10-14 NOTE — ED Notes (Signed)
Pt refusing the second dose of morphine "because the first dose didn't help". Dr. Clarene Duke made aware.

## 2013-10-14 NOTE — ED Notes (Signed)
Pt in c/o left flank pain that started this am with n/v, pain started suddenly, pt restless in triage

## 2013-12-10 ENCOUNTER — Encounter: Payer: Self-pay | Admitting: Obstetrics & Gynecology

## 2013-12-10 ENCOUNTER — Ambulatory Visit (INDEPENDENT_AMBULATORY_CARE_PROVIDER_SITE_OTHER): Admitting: Obstetrics & Gynecology

## 2013-12-10 VITALS — BP 119/79 | HR 83 | Temp 98.4°F | Ht 65.0 in | Wt 165.0 lb

## 2013-12-10 DIAGNOSIS — Z01419 Encounter for gynecological examination (general) (routine) without abnormal findings: Secondary | ICD-10-CM

## 2013-12-10 DIAGNOSIS — N926 Irregular menstruation, unspecified: Secondary | ICD-10-CM

## 2013-12-10 DIAGNOSIS — R109 Unspecified abdominal pain: Secondary | ICD-10-CM

## 2013-12-10 LAB — CBC WITH DIFFERENTIAL/PLATELET
Basophils Relative: 0 % (ref 0–1)
Eosinophils Absolute: 0.3 10*3/uL (ref 0.0–0.7)
Hemoglobin: 13 g/dL (ref 12.0–15.0)
MCH: 27.7 pg (ref 26.0–34.0)
MCHC: 32.6 g/dL (ref 30.0–36.0)
Monocytes Relative: 8 % (ref 3–12)
Neutro Abs: 3.9 10*3/uL (ref 1.7–7.7)
Neutrophils Relative %: 50 % (ref 43–77)
Platelets: 321 10*3/uL (ref 150–400)
RBC: 4.69 MIL/uL (ref 3.87–5.11)

## 2013-12-10 LAB — COMPREHENSIVE METABOLIC PANEL
AST: 14 U/L (ref 0–37)
Albumin: 4.7 g/dL (ref 3.5–5.2)
BUN: 14 mg/dL (ref 6–23)
Calcium: 10.3 mg/dL (ref 8.4–10.5)
Chloride: 102 mEq/L (ref 96–112)
Glucose, Bld: 83 mg/dL (ref 70–99)
Potassium: 4.1 mEq/L (ref 3.5–5.3)
Sodium: 137 mEq/L (ref 135–145)
Total Protein: 8.5 g/dL — ABNORMAL HIGH (ref 6.0–8.3)

## 2013-12-10 LAB — POCT URINALYSIS DIPSTICK
Bilirubin, UA: NEGATIVE
Glucose, UA: NEGATIVE
Ketones, UA: NEGATIVE
Leukocytes, UA: NEGATIVE

## 2013-12-10 LAB — POCT URINE PREGNANCY: Preg Test, Ur: NEGATIVE

## 2013-12-10 NOTE — Progress Notes (Signed)
Subjective:     Susan Crawford is a 27 y.o. female here for a routine exam.  Current complaints: low back pain and right side pain, prolonged menstrual  Bleeding. Would like to know if she needs to have a mammogram due to past hx of lumpectomy.   Personal health questionnaire reviewed: yes.   Gynecologic History Patient's last menstrual period was 12/02/2013. Contraception: IUD Last Pap: unknown Last mammogram: N/A  Obstetric History OB History  Gravida Para Term Preterm AB SAB TAB Ectopic Multiple Living  2 2 2  0      2    # Outcome Date GA Lbr Len/2nd Weight Sex Delivery Anes PTL Lv  2 TRM 06/19/13 [redacted]w[redacted]d 16:10 / 00:03 7 lb 13.7 oz (3.564 kg) M SVD EPI  Y     Comments: WNL  1 TRM 12/28/07 [redacted]w[redacted]d   F SVD EPI  Y       The following portions of the patient's history were reviewed and updated as appropriate: allergies, current medications, past family history, past medical history, past social history, past surgical history and problem list.  Review of Systems Pertinent items are noted in HPI.    Objective:    General appearance: alert Breasts: normal appearance, no masses or tenderness Abdomen: soft, non-tender; bowel sounds normal; no masses,  no organomegaly Pelvic: cervix normal in appearance, external genitalia normal, no adnexal masses or tenderness, uterus normal size, shape, and consistency and vagina normal without discharge    Assessment:    AUB--O Remote h/o breast surgery--pathology per pt with possible atypical cells; screening MMG recommended  Vague abdominal symptoms/pain Plan:    MMG Orders Placed This Encounter  Procedures  . Urine culture  . WET PREP BY MOLECULAR PROBE  . GC/Chlamydia Probe Amp  . Urinalysis, Routine w reflex microscopic  . Comprehensive metabolic panel  . CBC with Differential  . Urine Microscopic  . POCT Urinalysis Dipstick  . POCT urine pregnancy  Return prn

## 2013-12-11 LAB — URINALYSIS, MICROSCOPIC ONLY: Crystals: NONE SEEN

## 2013-12-11 LAB — URINALYSIS, ROUTINE W REFLEX MICROSCOPIC
Nitrite: NEGATIVE
Protein, ur: NEGATIVE mg/dL
Specific Gravity, Urine: 1.029 (ref 1.005–1.030)
Urobilinogen, UA: 0.2 mg/dL (ref 0.0–1.0)

## 2013-12-12 LAB — URINE CULTURE

## 2013-12-14 ENCOUNTER — Other Ambulatory Visit: Payer: Self-pay | Admitting: *Deleted

## 2013-12-14 DIAGNOSIS — Z30431 Encounter for routine checking of intrauterine contraceptive device: Secondary | ICD-10-CM

## 2013-12-14 DIAGNOSIS — R109 Unspecified abdominal pain: Secondary | ICD-10-CM

## 2013-12-16 NOTE — Patient Instructions (Signed)

## 2013-12-23 ENCOUNTER — Other Ambulatory Visit: Payer: Self-pay | Admitting: *Deleted

## 2013-12-23 DIAGNOSIS — N390 Urinary tract infection, site not specified: Secondary | ICD-10-CM

## 2013-12-23 MED ORDER — CEPHALEXIN 500 MG PO CAPS: 500.0000 mg | ORAL_CAPSULE | Freq: Two times a day (BID) | ORAL | Status: DC

## 2014-01-14 ENCOUNTER — Other Ambulatory Visit: Payer: Self-pay | Admitting: *Deleted

## 2014-01-14 DIAGNOSIS — Z9889 Other specified postprocedural states: Secondary | ICD-10-CM

## 2014-01-21 ENCOUNTER — Ambulatory Visit (HOSPITAL_COMMUNITY)

## 2014-01-28 ENCOUNTER — Other Ambulatory Visit: Payer: Self-pay | Admitting: *Deleted

## 2014-01-28 DIAGNOSIS — N390 Urinary tract infection, site not specified: Secondary | ICD-10-CM

## 2014-01-28 MED ORDER — CEPHALEXIN 500 MG PO CAPS: 500.0000 mg | ORAL_CAPSULE | Freq: Two times a day (BID) | ORAL | Status: DC

## 2014-02-03 ENCOUNTER — Other Ambulatory Visit: Payer: Self-pay | Admitting: *Deleted

## 2014-02-03 DIAGNOSIS — N390 Urinary tract infection, site not specified: Secondary | ICD-10-CM

## 2014-02-03 MED ORDER — CEPHALEXIN 500 MG PO CAPS: 500.0000 mg | ORAL_CAPSULE | Freq: Two times a day (BID) | ORAL | Status: DC

## 2014-03-28 IMAGING — CT CT ABD-PELV W/O CM
2 of 4 series · 17 of 46 positions shown, 19 images · non-contrast
Comparison: None.

CLINICAL DATA: Flank pain

EXAM:
CT ABDOMEN AND PELVIS WITHOUT CONTRAST
TECHNIQUE: Multidetector CT imaging of the abdomen and pelvis was performed
following the standard protocol without oral or intravenous
contrast.

[Series 2: stone study · axial · 0.67mm/px · z∈[-487,-77]mm · 14 of 92 slices shown, 16 images]
[im 5/92  soft-tissue]
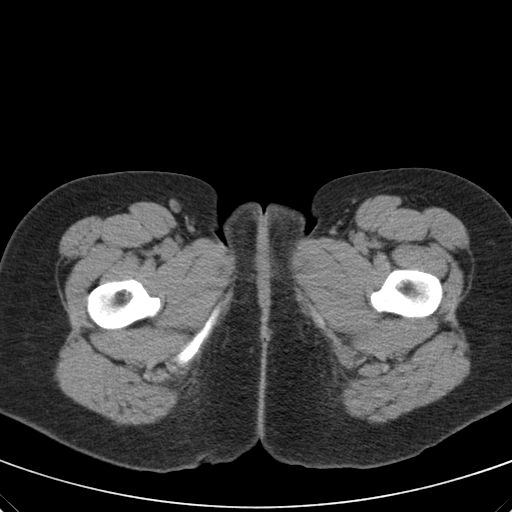
[im 5/92  bone]
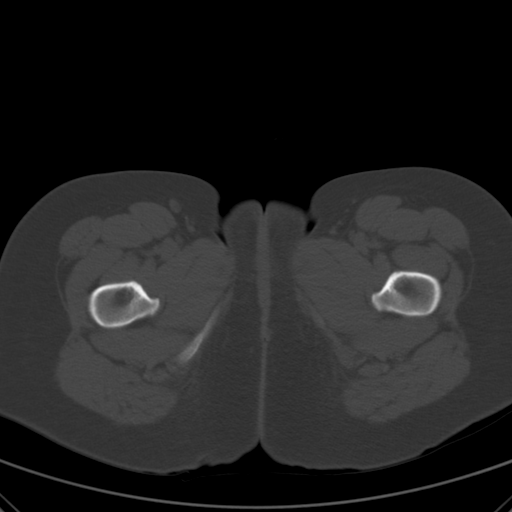
[im 14/92  soft-tissue]
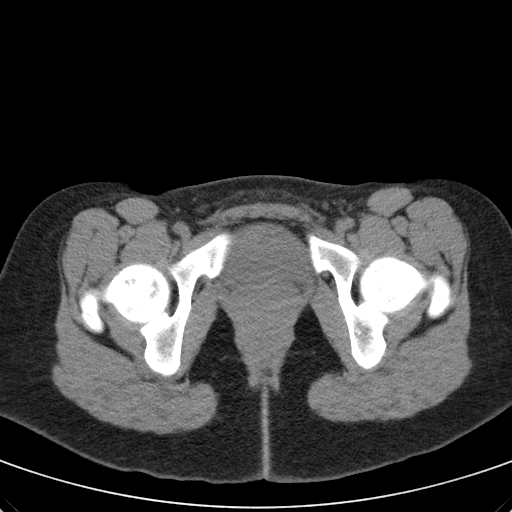
[im 19/92  soft-tissue]
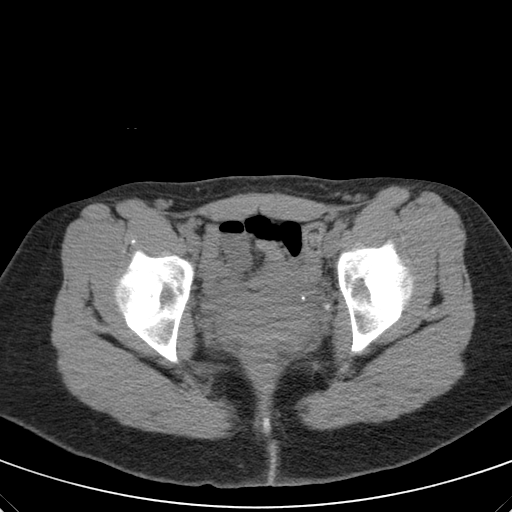
[im 23/92  soft-tissue]
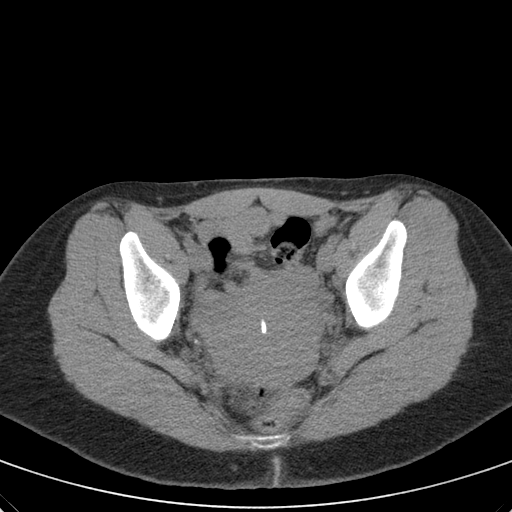
[im 32/92  soft-tissue]
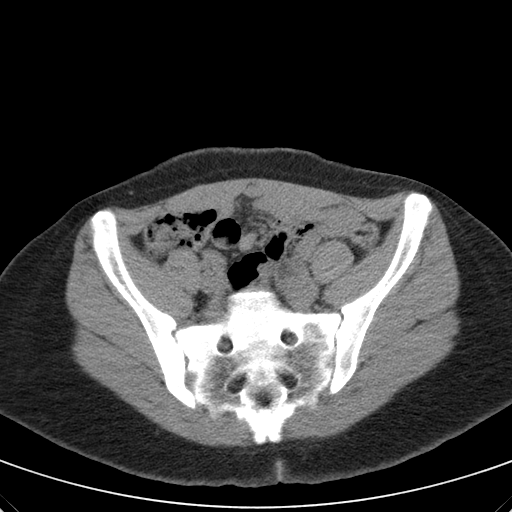
[im 37/92  soft-tissue]
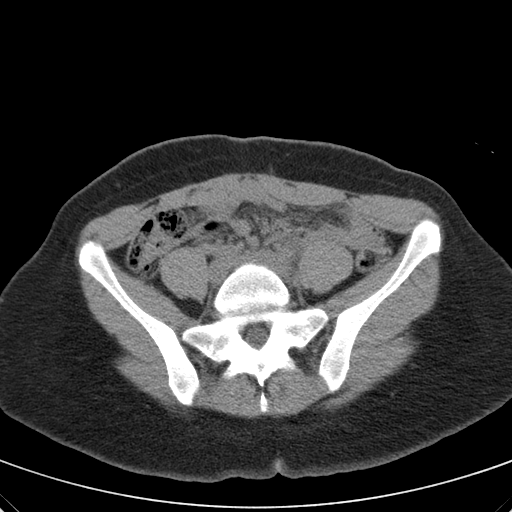
[im 41/92  soft-tissue]
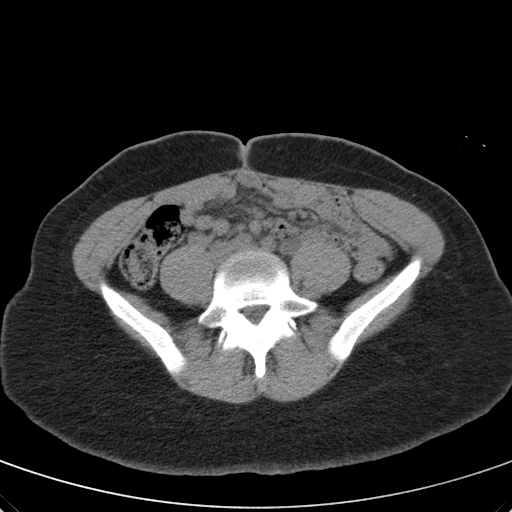
[im 51/92  soft-tissue]
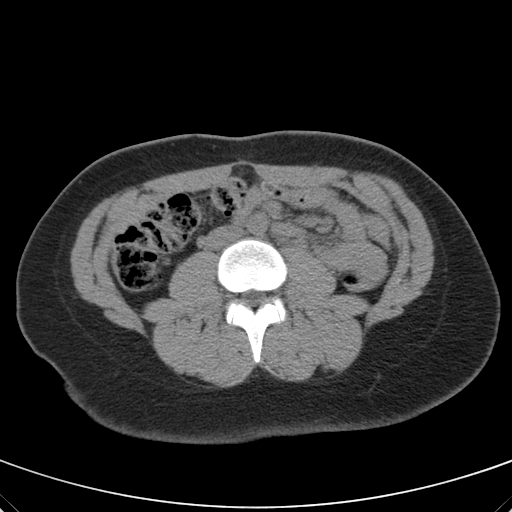
[im 55/92  soft-tissue]
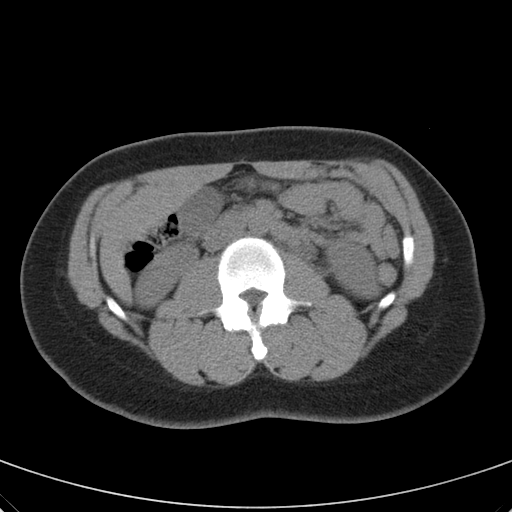
[im 55/92  bone]
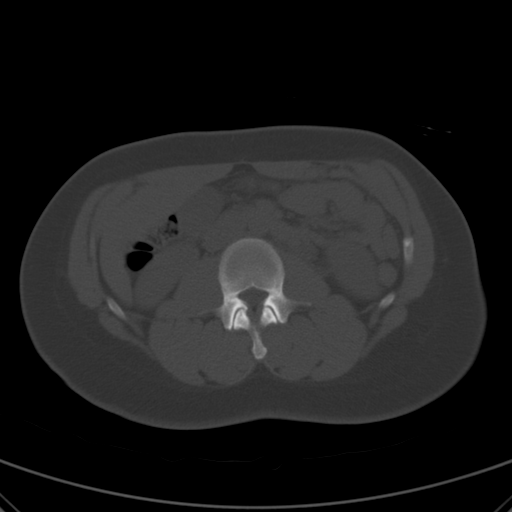
[im 60/92  soft-tissue]
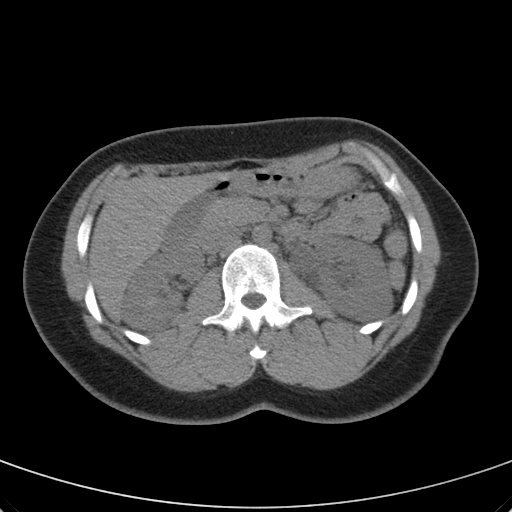
[im 69/92  soft-tissue]
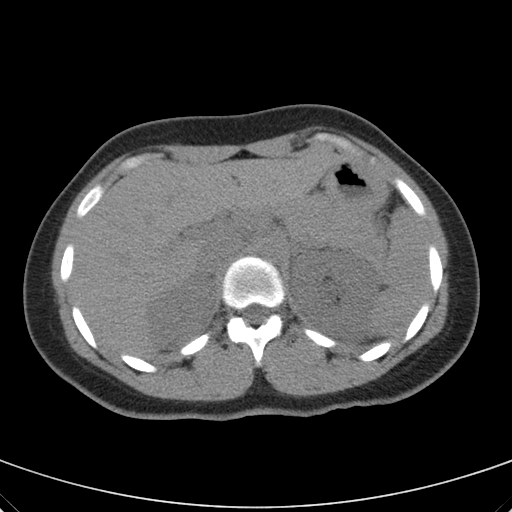
[im 73/92  soft-tissue]
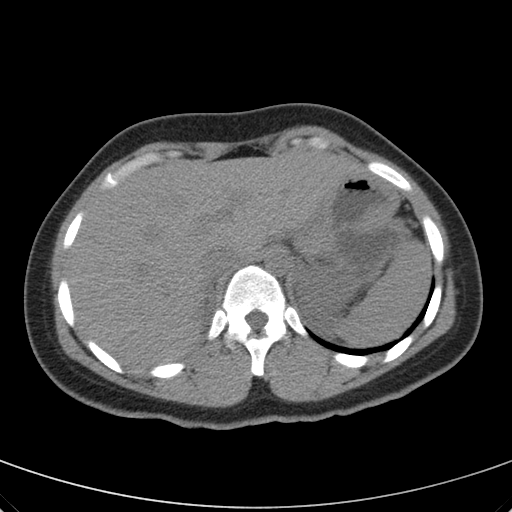
[im 78/92  soft-tissue]
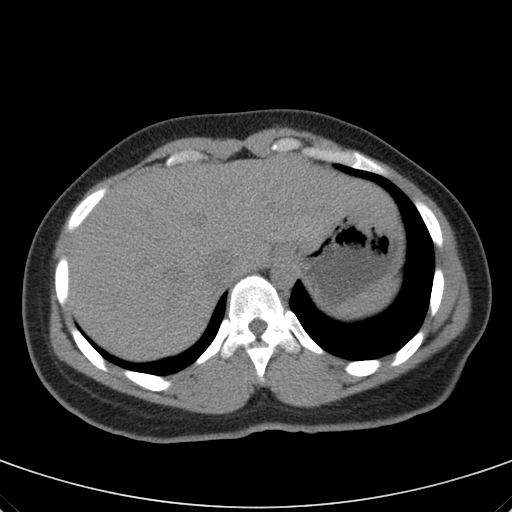
[im 87/92  soft-tissue]
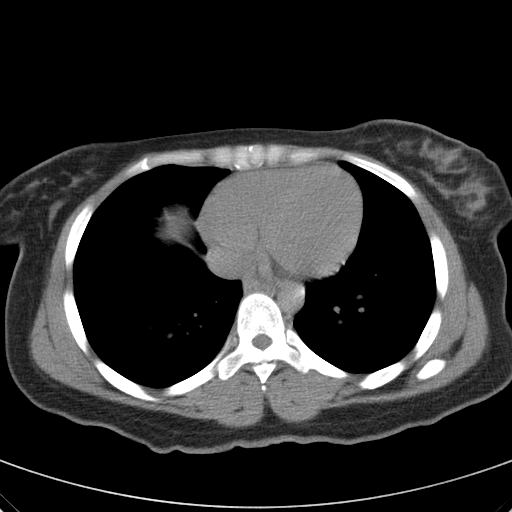

[mpr, coronals, coronal · coronal · 0.89mm/px · 3 of 86 slices shown]
[im 29/86  soft-tissue]
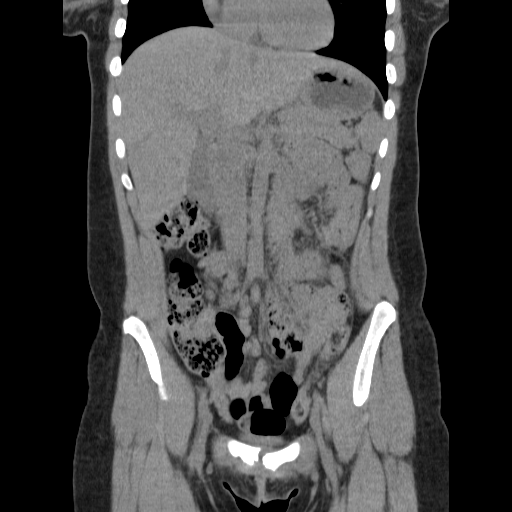
[im 38/86  soft-tissue]
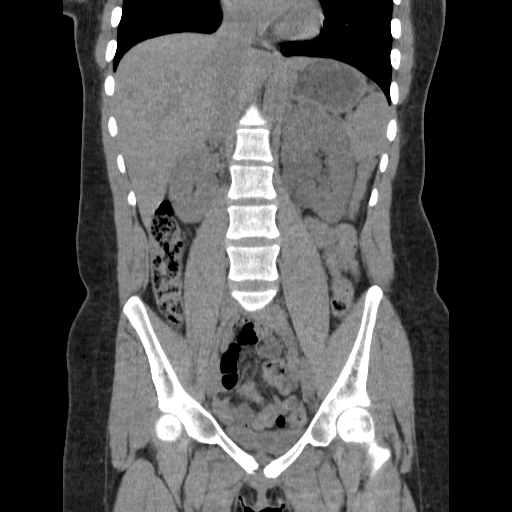
[im 48/86  soft-tissue]
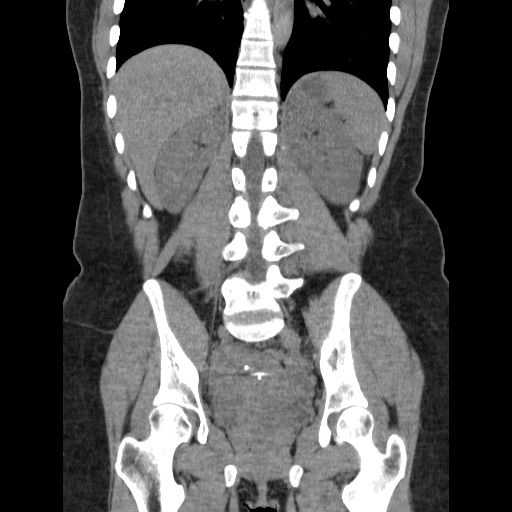

[17 of 46 positions shown; findings below may reference images not displayed]

FINDINGS: Lung bases are clear.

The liver is enlarged, measuring 18.6 cm in length. No focal liver
lesions are identified on this noncontrast enhanced study. There is
no biliary duct dilatation.

Spleen, pancreas, and adrenals appear normal.

Right kidney appears normal without mass, calculus, or
hydronephrosis. There is no ureteral calculus or ureterectasis on
the right.

On the left, there is moderate generalized hydronephrosis. There is
no intrarenal calculus or mass the left. There is ureterectasis on
the left.

There is a 2 mm calculus in the distal left ureter near the
ureterovesical junction seen on slice 74. No other ureteral calculi
are appreciable. Slightly lateral to this calculus, there is a
phlebolith in the pelvis.

In the pelvis, there is an intrauterine device within the uterus.
There is no pelvic mass or fluid collection. Appendix appears
normal.

There is no bowel obstruction. No free air or portal venous air.

There is no ascites, adenopathy, or abscess in the abdomen or
pelvis. There is postoperative change at the level of the emboli
kiss. No hernia is seen.

Aorta is nonaneurysmal. There are no blastic or lytic bone lesions.
IMPRESSION: 2 mm calculus distal left ureter near the ureterovesical junction
causing moderate hydronephrosis on the left.

There is no bowel obstruction. No mesenteric inflammation or
abscess. Appendix appears normal.

The enlarged liver. No focal liver lesions identified on this
noncontrast enhanced study.

## 2014-10-25 ENCOUNTER — Encounter: Payer: Self-pay | Admitting: Obstetrics & Gynecology

## 2014-12-20 ENCOUNTER — Encounter: Payer: Self-pay | Admitting: *Deleted

## 2014-12-21 ENCOUNTER — Encounter: Payer: Self-pay | Admitting: Obstetrics & Gynecology

## 2019-09-24 ENCOUNTER — Encounter (HOSPITAL_COMMUNITY): Payer: Self-pay

## 2019-09-24 ENCOUNTER — Other Ambulatory Visit: Payer: Self-pay

## 2019-09-24 ENCOUNTER — Ambulatory Visit (HOSPITAL_COMMUNITY)
Admission: EM | Admit: 2019-09-24 | Discharge: 2019-09-24 | Disposition: A | Discharge status: Home / Self Care | Attending: Emergency Medicine | Admitting: Emergency Medicine

## 2019-09-24 DIAGNOSIS — N3001 Acute cystitis with hematuria: Secondary | ICD-10-CM | POA: Diagnosis not present

## 2019-09-24 LAB — POCT URINALYSIS DIP (DEVICE)
Bilirubin Urine: NEGATIVE
Glucose, UA: NEGATIVE mg/dL
Ketones, ur: NEGATIVE mg/dL
Nitrite: NEGATIVE
Protein, ur: NEGATIVE mg/dL
Specific Gravity, Urine: 1.025 (ref 1.005–1.030)
Urobilinogen, UA: 2 mg/dL — ABNORMAL HIGH (ref 0.0–1.0)
pH: 7 (ref 5.0–8.0)

## 2019-09-24 MED ORDER — NITROFURANTOIN MONOHYD MACRO 100 MG PO CAPS: 100.0000 mg | ORAL_CAPSULE | Freq: Two times a day (BID) | ORAL | 0 refills | Status: DC

## 2019-09-24 MED ORDER — FLUCONAZOLE 150 MG PO TABS: 150.0000 mg | ORAL_TABLET | Freq: Once | ORAL | 1 refills | Status: AC

## 2019-09-24 MED ORDER — PHENAZOPYRIDINE HCL 200 MG PO TABS: 200.0000 mg | ORAL_TABLET | Freq: Three times a day (TID) | ORAL | 0 refills | Status: DC | PRN

## 2019-09-24 NOTE — ED Triage Notes (Signed)
Patient presents to Urgent Care with complaints of urinary frequency since 2 days ago. Patient reports she thinks she has a UTI.

## 2019-09-24 NOTE — ED Provider Notes (Signed)
HPI  SUBJECTIVE:  Susan Crawford is a 33 y.o. female who presents with 2 days of urinary urgency, frequency, cloudy urine.  She reports some vulvar irritation.  She reports crampy midline pelvic pain for the past week, but is currently on menses.  States that this pain has not changed since her urinary symptoms started.  She denies dysuria.  No vomiting, fevers, other abdominal pain.  No back pain.  No vaginal odor, rash, itching, discharge.  She is in a new monogamous relationship with a female who is asymptomatic to her knowledge, however she is requesting STD testing.  No recent antibiotics.  She tried cranberry juice without improvement in her symptoms.  Symptoms are better with urination, no aggravating factors.  She has a past medical history of UTIs and states this feels identical to that.  She also has a history of nonobstructing nephrolithiasis, vaginal yeast infections.  No history of pyelonephritis, diabetes, gonorrhea, chlamydia, HIV, HSV, syphilis, trichomonas, BV.  LMP: Now.  PMD: None.    Past Medical History:  Diagnosis Date  . Asthma   . Preterm labor     Past Surgical History:  Procedure Laterality Date  . BREAST LUMPECTOMY      Family History  Problem Relation Age of Onset  . Asthma Mother   . Hypertension Mother   . Hypertension Maternal Grandmother   . Asthma Maternal Grandmother   . Other Neg Hx     Social History   Tobacco Use  . Smoking status: Never Smoker  . Smokeless tobacco: Never Used  Substance Use Topics  . Alcohol use: No    Comment: rare  . Drug use: No    No current facility-administered medications for this encounter.   Current Outpatient Medications:  .  FENUGREEK PO, Take 3 capsules by mouth 3 (three) times daily., Disp: , Rfl:  .  fluconazole (DIFLUCAN) 150 MG tablet, Take 1 tablet (150 mg total) by mouth once for 1 dose. 1 tab po x 1. May repeat in 72 hours if no improvement, Disp: 2 tablet, Rfl: 1 .  ibuprofen (ADVIL,MOTRIN) 600 MG  tablet, Take 600 mg by mouth every 8 (eight) hours as needed for pain., Disp: , Rfl:  .  nitrofurantoin, macrocrystal-monohydrate, (MACROBID) 100 MG capsule, Take 1 capsule (100 mg total) by mouth 2 (two) times daily. X 5 days, Disp: 10 capsule, Rfl: 0 .  phenazopyridine (PYRIDIUM) 200 MG tablet, Take 1 tablet (200 mg total) by mouth 3 (three) times daily as needed for pain., Disp: 6 tablet, Rfl: 0  No Known Allergies   ROS  As noted in HPI.   Physical Exam  BP (S) (!) 140/93 (BP Location: Right Arm) Comment: pt has to urinate very badly  Pulse 89   Temp 98.4 F (36.9 C) (Oral)   Resp 18   SpO2 100%   Constitutional: Well developed, well nourished, no acute distress Eyes:  EOMI, conjunctiva normal bilaterally HENT: Normocephalic, atraumatic,mucus membranes moist Respiratory: Normal inspiratory effort Cardiovascular: Normal rate GI: nondistended soft.  Positive suprapubic tenderness.  No flank tenderness. Back: No CVAT skin: No rash, skin intact Musculoskeletal: no deformities Neurologic: Alert & oriented x 3, no focal neuro deficits Psychiatric: Speech and behavior appropriate   ED Course   Medications - No data to display  Orders Placed This Encounter  Procedures  . Urine culture    Standing Status:   Standing    Number of Occurrences:   1    Order Specific Question:  List patient's active antibiotics    Answer:   macrobid    Order Specific Question:   Patient immune status    Answer:   Normal  . POCT urinalysis dip (device)    Standing Status:   Standing    Number of Occurrences:   1    Results for orders placed or performed during the hospital encounter of 09/24/19 (from the past 24 hour(s))  POCT urinalysis dip (device)     Status: Abnormal   Collection Time: 09/24/19  3:50 PM  Result Value Ref Range   Glucose, UA NEGATIVE NEGATIVE mg/dL   Bilirubin Urine NEGATIVE NEGATIVE   Ketones, ur NEGATIVE NEGATIVE mg/dL   Specific Gravity, Urine 1.025 1.005 -  1.030   Hgb urine dipstick TRACE (A) NEGATIVE   pH 7.0 5.0 - 8.0   Protein, ur NEGATIVE NEGATIVE mg/dL   Urobilinogen, UA 2.0 (H) 0.0 - 1.0 mg/dL   Nitrite NEGATIVE NEGATIVE   Leukocytes,Ua SMALL (A) NEGATIVE   No results found.  ED Clinical Impression  1. Acute cystitis with hematuria      ED Assessment/Plan  H &P suggestive of UTI, urine dip is supportive of this, however will send urine off for culture to confirm diagnosis of a UTI and to make sure that she is on the correct antibiotic.  Will send home with Pyridium.  Also checking gonorrhea, chlamydia, wet prep.  Advised patient to give [place] a working phone number or to sign up for MyChart so that she can get results ASAP.  Providing primary care list for ongoing care.  Also providing prescription for Diflucan due to vaginal irritation.  Discussed labs, MDM, treatment plan, and plan for follow-up with patient. Discussed sn/sx that should prompt return to the ED. patient agrees with plan.   Meds ordered this encounter  Medications  . phenazopyridine (PYRIDIUM) 200 MG tablet    Sig: Take 1 tablet (200 mg total) by mouth 3 (three) times daily as needed for pain.    Dispense:  6 tablet    Refill:  0  . nitrofurantoin, macrocrystal-monohydrate, (MACROBID) 100 MG capsule    Sig: Take 1 capsule (100 mg total) by mouth 2 (two) times daily. X 5 days    Dispense:  10 capsule    Refill:  0  . fluconazole (DIFLUCAN) 150 MG tablet    Sig: Take 1 tablet (150 mg total) by mouth once for 1 dose. 1 tab po x 1. May repeat in 72 hours if no improvement    Dispense:  2 tablet    Refill:  1    *This clinic note was created using Scientist, clinical (histocompatibility and immunogenetics). Therefore, there may be occasional mistakes despite careful proofreading.   ?    Domenick Gong, MD 09/24/19 1742

## 2019-09-24 NOTE — Discharge Instructions (Signed)
The Pyridium will help with your symptoms and the Macrobid will treat a urinary tract infection.  I am sending you home with Diflucan in case you have a yeast infection as well.  You may want to wait for the results prior to starting this.  Give [place] a working phone number so that we can contact you if needed.  I have sent your urine off for culture in addition to gonorrhea, chlamydia, trichomonas, BV and yeast testing.  Below is a list of primary care practices who are taking new patients for you to follow-up with.  [facility] at [facility] [address] [place], [place] [address] [phone]  [place], [place] [address] [phone]  [facility] [phone] [place] [address]   family Practice Center: [place]  [phone]  [place] and [facility]: [place]   [phone]  [facility] Family [facility]: [address] [place]  [phone]  [place] primary care : [address] [place] [phone]  [facility] Primary Care: [address] [place] [phone] [phone]  [facility]: [place] [phone]  Dr. Blanchie Serve [place]  [phone]  Dr. Benito Mccreedy, [facility]. [place], [place] [address]  [phone]  Go to www.goodrx.com to look up your medications. This will give you a list of where you can find your prescriptions at the most affordable prices. Or ask the pharmacist what the cash price is, or if they have any other discount programs available to help  make your medication more affordable. This can be less expensive than what you would pay with insurance.

## 2019-09-26 LAB — URINE CULTURE: Culture: >=100000 — AB

## 2019-09-28 ENCOUNTER — Telehealth (HOSPITAL_COMMUNITY): Payer: Self-pay | Admitting: Emergency Medicine

## 2019-09-28 LAB — CERVICOVAGINAL ANCILLARY ONLY
Bacterial Vaginitis (gardnerella): POSITIVE — AB
Candida Glabrata: NEGATIVE
Candida Vaginitis: POSITIVE — AB
Chlamydia: NEGATIVE
Neisseria Gonorrhea: NEGATIVE
Trichomonas: NEGATIVE

## 2019-09-28 MED ORDER — METRONIDAZOLE 500 MG PO TABS: 500.0000 mg | ORAL_TABLET | Freq: Two times a day (BID) | ORAL | 0 refills | Status: AC

## 2019-09-28 NOTE — Telephone Encounter (Signed)
Bacterial vaginosis is positive. This was not treated at the urgent care visit.  Flagyl 500 mg BID x 7 days #14 no refills sent to patients pharmacy of choice.    Candida (yeast) is positive.  Prescription for fluconazole was given at the urgent care visit.    Urine culture was positive for STAPHYLOCOCCUS SAPROPHYTICUS and was given  macrobid at urgent care visit. Pt contacted and made aware, educated on completing antibiotic and to follow up if symptoms are persistent. Verbalized understanding.   Patient contacted and made aware of    results, all questions answered

## 2019-10-18 ENCOUNTER — Emergency Department (HOSPITAL_COMMUNITY)
Admission: EM | Admit: 2019-10-18 | Discharge: 2019-10-18 | Disposition: A | Discharge status: Home / Self Care | Attending: Emergency Medicine | Admitting: Emergency Medicine

## 2019-10-18 ENCOUNTER — Other Ambulatory Visit: Payer: Self-pay

## 2019-10-18 ENCOUNTER — Encounter (HOSPITAL_COMMUNITY): Payer: Self-pay

## 2019-10-18 DIAGNOSIS — Z20828 Contact with and (suspected) exposure to other viral communicable diseases: Secondary | ICD-10-CM | POA: Insufficient documentation

## 2019-10-18 DIAGNOSIS — H9201 Otalgia, right ear: Secondary | ICD-10-CM | POA: Insufficient documentation

## 2019-10-18 DIAGNOSIS — J45909 Unspecified asthma, uncomplicated: Secondary | ICD-10-CM | POA: Insufficient documentation

## 2019-10-18 DIAGNOSIS — J029 Acute pharyngitis, unspecified: Secondary | ICD-10-CM | POA: Diagnosis not present

## 2019-10-18 LAB — GROUP A STREP BY PCR: Group A Strep by PCR: NOT DETECTED

## 2019-10-18 MED ORDER — ACETAMINOPHEN 500 MG PO TABS: 1000.0000 mg | ORAL_TABLET | Freq: Three times a day (TID) | ORAL | 0 refills | Status: DC | PRN

## 2019-10-18 MED ORDER — IBUPROFEN 600 MG PO TABS: 600.0000 mg | ORAL_TABLET | Freq: Four times a day (QID) | ORAL | 0 refills | Status: DC | PRN

## 2019-10-18 MED ORDER — CETIRIZINE-PSEUDOEPHEDRINE ER 5-120 MG PO TB12: 1.0000 | ORAL_TABLET | Freq: Every day | ORAL | 0 refills | Status: DC

## 2019-10-18 MED ORDER — DEXAMETHASONE 4 MG PO TABS
10.0000 mg | ORAL_TABLET | Freq: Once | ORAL | Status: AC
  Administered 2019-10-18: 10 mg via ORAL
  Filled 2019-10-18: qty 3

## 2019-10-18 NOTE — ED Triage Notes (Signed)
Patient complains of sore throat and right ear pain x 2 days, denies fever. Alert and oriented, NAD

## 2019-10-18 NOTE — ED Provider Notes (Signed)
[facility] EMERGENCY DEPARTMENT Provider Note   CSN: [id number] Arrival date & time: 10/18/19  9735     History   Chief Complaint No chief complaint on file.   HPI Susan Crawford is a 33 y.o. female with history of asthma who presents with a 2-day history of sore throat and right ear pain.  Patient reports pain when swallowing.  She has had a minimal dry cough.  She reports some decrease in smell.  She denies any fevers.  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  Most recently tested 2 days ago and was negative.  Patient denies any nausea, vomiting, abdominal pain, dental pain.  She reports her kids are sick as well.     HPI  Past Medical History:  Diagnosis Date  . Asthma   . Preterm labor     Patient Active Problem List   Diagnosis Date Noted  . ASTHMA, PERSISTENT 09/25/2010  . ACID REFLUX DISEASE 12/08/2009  . ABDOMINAL CRAMPS 12/08/2009  . ALLERGIC RHINITIS 10/18/2008  . ELEVATED BP READING WITHOUT DX HYPERTENSION 10/18/2008    Past Surgical History:  Procedure Laterality Date  . BREAST LUMPECTOMY       OB History    Gravida  2   Para  2   Term  2   Preterm  0   AB      Living  2     SAB      TAB      Ectopic      Multiple      Live Births  2            Home Medications    Prior to Admission medications   Medication Sig Start Date End Date Taking? Authorizing Provider  acetaminophen (TYLENOL) 500 MG tablet Take 2 tablets (1,000 mg total) by mouth every 8 (eight) hours as needed for moderate pain. 10/18/19   Angeline Trick, Bea Graff, PA-C  cetirizine-pseudoephedrine (ZYRTEC-D) 5-120 MG tablet Take 1 tablet by mouth daily. 10/18/19   Arya Luttrull, Bea Graff, PA-C  FENUGREEK PO Take 3 capsules by mouth 3 (three) times daily.    [provider]  ibuprofen (ADVIL) 600 MG tablet Take 1 tablet (600 mg total) by mouth every 6 (six) hours as needed. 10/18/19   Rendon Howell, Bea Graff,  PA-C  nitrofurantoin, macrocrystal-monohydrate, (MACROBID) 100 MG capsule Take 1 capsule (100 mg total) by mouth 2 (two) times daily. X 5 days 09/24/19   Melynda Ripple, MD  phenazopyridine (PYRIDIUM) 200 MG tablet Take 1 tablet (200 mg total) by mouth 3 (three) times daily as needed for pain. 09/24/19   Melynda Ripple, MD    Family History Family History  Problem Relation Age of Onset  . Asthma Mother   . Hypertension Mother   . Hypertension Maternal Grandmother   . Asthma Maternal Grandmother   . Other Neg Hx     Social History Social History   Tobacco Use  . Smoking status: Never Smoker  . Smokeless tobacco: Never Used  Substance Use Topics  . Alcohol use: No    Comment: rare  . Drug use: No     Allergies   Patient has no known allergies.   Review of Systems Review of Systems  Constitutional: Negative for fever.  HENT: Positive for ear pain and sore throat.   Respiratory: Positive for cough (dry, minimal).   Gastrointestinal: Negative for abdominal pain, nausea and  vomiting.     Physical Exam Updated Vital Signs BP 113/84 (BP Location: Right Arm)   Pulse 77   Temp 98.7 F (37.1 C)   Resp 16   SpO2 100%   Physical Exam Vitals signs and nursing note reviewed.  Constitutional:      General: She is not in acute distress.    Appearance: She is well-developed. She is not diaphoretic.  HENT:     Head: Normocephalic and atraumatic.     Right Ear: Tympanic membrane normal.     Left Ear: Tympanic membrane normal.     Mouth/Throat:     Pharynx: Posterior oropharyngeal erythema present. No oropharyngeal exudate.     Tonsils: No tonsillar exudate or tonsillar abscesses. 1+ on the right. 1+ on the left.  Eyes:     General: No scleral icterus.       Right eye: No discharge.        Left eye: No discharge.     Conjunctiva/sclera: Conjunctivae normal.     Pupils: Pupils are equal, round, and reactive to light.  Neck:     Musculoskeletal: Normal range of  motion and neck supple.     Thyroid: No thyromegaly.  Cardiovascular:     Rate and Rhythm: Normal rate and regular rhythm.     Heart sounds: Normal heart sounds. No murmur. No friction rub. No gallop.   Pulmonary:     Effort: Pulmonary effort is normal. No respiratory distress.     Breath sounds: Normal breath sounds. No stridor. No wheezing or rales.  Abdominal:     General: Bowel sounds are normal. There is no distension.     Palpations: Abdomen is soft.     Tenderness: There is no abdominal tenderness. There is no guarding or rebound.  Lymphadenopathy:     Cervical: No cervical adenopathy.  Skin:    General: Skin is warm and dry.     Coloration: Skin is not pale.     Findings: No rash.  Neurological:     Mental Status: She is alert.     Coordination: Coordination normal.      ED Treatments / Results  Labs (all labs ordered are listed, but only abnormal results are displayed) Labs Reviewed  GROUP A STREP BY PCR  NOVEL CORONAVIRUS, NAA (HOSP ORDER, SEND-OUT TO REF LAB; TAT 18-24 HRS)    EKG None  Radiology No results found.  Procedures Procedures (including critical care time)  Medications Ordered in ED Medications  dexamethasone (DECADRON) tablet 10 mg (10 mg Oral Given 10/18/19 1133)     Initial Impression / Assessment and Plan / ED Course  I have reviewed the triage vital signs and the nursing notes.  Pertinent labs & imaging results that were available during my care of the patient were reviewed by me and considered in my medical decision making (see chart for details).        Patient presents with a 2-day history of sore throat and right ear pain.  No signs of otitis media.  Strep is negative.  COVID-19 pending.  Will treat symptomatically at this time.  Will replace Zyrtec-D to help with congestion as patient has history of allergies and is already taking Zyrtec.  Ibuprofen, Tylenol discussed.  Single dose of Decadron given in the ED.  Return  precautions discussed.  Patient understands agrees with plan.  Patient vitals stable throughout ED course and discharged in satisfactory condition.   Susan Crawford was evaluated in Emergency Department on  10/18/2019 for the symptoms described in the history of present illness. She was evaluated in the context of the global COVID-19 pandemic, which necessitated consideration that the patient might be at risk for infection with the SARS-CoV-2 virus that causes COVID-19. Institutional protocols and algorithms that pertain to the evaluation of patients at risk for COVID-19 are in a state of rapid change based on information released by regulatory bodies including the CDC and federal and state organizations. These policies and algorithms were followed during the patient's care in the ED.   Final Clinical Impressions(s) / ED Diagnoses   Final diagnoses:  Sore throat  Right ear pain    ED Discharge Orders         Ordered    cetirizine-pseudoephedrine (ZYRTEC-D) 5-120 MG tablet  Daily     10/18/19 1241    ibuprofen (ADVIL) 600 MG tablet  Every 6 hours PRN     10/18/19 1241    acetaminophen (TYLENOL) 500 MG tablet  Every 8 hours PRN     10/18/19 1241           Emi HolesLaw, Corvette Orser M, PA-C 10/18/19 1601    Little, Ambrose Finlandachel Morgan, MD 10/20/19 1337

## 2019-10-18 NOTE — ED Notes (Signed)
Patient Alert and oriented to baseline. Stable and ambulatory to baseline. Patient verbalized understanding of the discharge instructions.  Patient belongings were taken by the patient.   

## 2019-10-18 NOTE — Discharge Instructions (Addendum)
Take Zyrtec-D daily (instead of Zyrtec) to help with nasal congestion and ear pressure.  Take ibuprofen and Tylenol alternating as prescribed for your pain.  Gargle with warm salt water daily.  You will be called if your COVID-19 test is positive.  If positive, you will need to quarantine for 10 days and cannot return to work until you are symptom free for 3 days.  Please return the emergency department if you develop any new or worsening symptoms including inability to swallow your own saliva, lockjaw, change in voice, or any other concerning symptoms

## 2019-10-19 LAB — NOVEL CORONAVIRUS, NAA (HOSP ORDER, SEND-OUT TO REF LAB; TAT 18-24 HRS): SARS-CoV-2, NAA: NOT DETECTED

## 2019-10-21 ENCOUNTER — Ambulatory Visit: Admitting: Family Medicine

## 2019-10-28 ENCOUNTER — Other Ambulatory Visit: Payer: Self-pay

## 2019-10-28 ENCOUNTER — Ambulatory Visit (INDEPENDENT_AMBULATORY_CARE_PROVIDER_SITE_OTHER): Admitting: Family Medicine

## 2019-10-28 ENCOUNTER — Encounter: Payer: Self-pay | Admitting: Family Medicine

## 2019-10-28 VITALS — BP 112/74 | HR 87 | Wt 168.0 lb

## 2019-10-28 DIAGNOSIS — B379 Candidiasis, unspecified: Secondary | ICD-10-CM | POA: Diagnosis not present

## 2019-10-28 MED ORDER — FLUCONAZOLE 150 MG PO TABS: 150.0000 mg | ORAL_TABLET | ORAL | 0 refills | Status: AC

## 2019-10-28 MED ORDER — CLOTRIMAZOLE 1 % VA CREA: 1.0000 | TOPICAL_CREAM | Freq: Two times a day (BID) | VAGINAL | Status: AC

## 2019-10-28 NOTE — Progress Notes (Signed)
   Subjective:    Patient ID: Susan Crawford, female    DOB: Sep 03, 1986, 33 y.o.   MRN: [id number]   CC: [name] is a 34 y/o female who presents to the clinic today for initial PCP visit and for recurrent yeast infections.  HPI:  Recurrent yeast infection Pt has hx of recurrent yeast infections (>10 times). Presented to the ED on Oct 1st as was not established with PCP yet. Treated for UTI, BV and candida infection. Was given Fluconazole X 2 doses. Medication initially helped but now symptoms have returned. Pt has irritated vulva, "cloudy" urine and white cottage cheese like appearance on panty liner. Sexual activity worsens symptoms especially after using condoms. Has been trying "[employer]" twice daily started taking 1-2 weeks which has not helped much. Denies wearing tight clothes or douching. Sx of UTI resolved denies fevers, dysuria, hematuria, frequency or urgency.  OBGYN G3P3+1, births in 2009, 2014, 2018 Sexually active with one partner. Partner presumed negative for STD, he has no dysuria, lesions, discharge.  LMP: 13th Oct, irregular post birth control. Menorrhagia. Lasts for 15 days sometimes.  STDs age 74, recently tested and negative Pap smear last year in [place]: normal.    PMH Excision of benign breast lump (left) Recurrent yeast infections UTI No hospital admissions   [place]  FH HTN, DM   SH Drinks EOTH once a week Has never smoked cigarettes, last smoked marjauana 2 years ago.  Smoking status reviewed   ROS: pertinent noted in the HPI   Past medical history, surgical, family, and social history reviewed and updated in the EMR as appropriate. Reviewed problem list.   Objective:  BP 112/74   Pulse 87   Wt 168 lb (76.2 kg)   LMP 10/06/2019 (Approximate)   SpO2 100%   BMI 27.96 kg/m   Vitals and nursing note reviewed  General: NAD, pleasant, able to participate in exam, well appearing Cardiac: RRR, S1 S2 present. normal heart sounds, no  murmurs. Respiratory: CTAB, normal effort, No wheezes, rales or rhonchi Extremities: no edema or cyanosis. 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 Pt has FMx of DM, considered as cause but no glucosuria in recent UA. Pt declined repeat wet prep today. Will empirically treat with 150mg  Fluconazole once weekly for 4 weeks with clotrimazole cream. Pt see me for a follow up in a few weeks time.    Lattie Haw, MD  [place] PGY-1

## 2019-10-28 NOTE — Patient Instructions (Signed)
Hi Susan Crawford,  It was lovely to meet you today for the first time! I am happy to be your new PCP. We discussed your recurrent yeast infections which have been problematic for you.  I have prescribed you fluconazole 150 mg to be taken once a week for 4 weeks.  Please also take this with clotrimazole cream and you can put this on in the affected area twice a day. I recommend that you change the brand of condoms that you use and see if that helps with irritation in the genital area. I forward to meeting you again in a month's time for follow-up.   Please not hesitate to contact the clinic in the meantime if you have further questions.  Best wishes,   Dr. Posey Pronto

## 2019-10-29 DIAGNOSIS — B379 Candidiasis, unspecified: Secondary | ICD-10-CM | POA: Insufficient documentation

## 2019-10-29 NOTE — Assessment & Plan Note (Signed)
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 Pt has FMx of DM, considered as cause but no glucosuria in recent UA. Pt declined repeat wet prep today. Will empirically treat with 150mg  Fluconazole once weekly for 4 weeks with clotrimazole cream. Pt see me for a follow up in a few weeks time.

## 2019-12-28 ENCOUNTER — Ambulatory Visit: Admitting: Family Medicine

## 2020-01-14 ENCOUNTER — Other Ambulatory Visit: Payer: Self-pay

## 2020-01-14 ENCOUNTER — Other Ambulatory Visit (HOSPITAL_COMMUNITY)
Admission: RE | Admit: 2020-01-14 | Discharge: 2020-01-14 | Disposition: A | Discharge status: Home / Self Care | Source: Ambulatory Visit | Attending: Family Medicine | Admitting: Family Medicine

## 2020-01-14 ENCOUNTER — Ambulatory Visit (INDEPENDENT_AMBULATORY_CARE_PROVIDER_SITE_OTHER): Admitting: Family Medicine

## 2020-01-14 ENCOUNTER — Telehealth: Payer: Self-pay | Admitting: Family Medicine

## 2020-01-14 VITALS — BP 124/70 | HR 104 | Ht 65.0 in | Wt 167.0 lb

## 2020-01-14 DIAGNOSIS — N898 Other specified noninflammatory disorders of vagina: Secondary | ICD-10-CM | POA: Insufficient documentation

## 2020-01-14 DIAGNOSIS — K130 Diseases of lips: Secondary | ICD-10-CM | POA: Diagnosis not present

## 2020-01-14 LAB — POCT WET PREP (WET MOUNT)
Clue Cells Wet Prep Whiff POC: POSITIVE
Trichomonas Wet Prep HPF POC: ABSENT
WBC, Wet Prep HPF POC: >20

## 2020-01-14 NOTE — Assessment & Plan Note (Signed)
Assessment: Possible etiologies include candidiasis, BV, trichomonas or other STDs. Plan: -We will do wet prep and GC chlamydia today. -If these are normal we will pursue additional work-up -If any of these are positive we will pursue treatment for these -Patient also request to have HIV blood work today as well.

## 2020-01-14 NOTE — Assessment & Plan Note (Signed)
Assessment: Visual inspection of the patient's lips did not provide any concern for an allergic process.  Lips appear dry and with skin colored patches with a dry appearance.  I think it is very likely these were caused by the patient's dramatic increase in acidic fruits.  I believe it is reasonable to trial a few days without these with additional moisturization provided to the lips to see if this resolves her symptoms Plan: -Patient to use Chapstick for the next several days to provide moisture to her lips -Patient will reduce her intake of acidic fruits during this time -Patient to follow-up in 1 week if symptoms do not improve. -Return precautions given should the patient develop any concerning symptoms for allergic reaction though these are not present at this time.

## 2020-01-14 NOTE — Progress Notes (Signed)
   Subjective:    Patient ID: Susan Crawford, female    DOB: 1986-01-01, 34 y.o.   MRN: [id number]   CC: Bumps on lips/swelling  HPI:   Bumps on lips: Susan Crawford is a very pleasant 34 year old female that presents today for evaluation of "bumps on her lips with swelling". Started 1 week ago with a few small bumps on her lips. She was eating a lot of oranges at this time. She was eating 3 oranges per day in addition to orange just prior to these appearing, and initally atributed this to her bumps. She stopped eating the oranges 2-3 days ago however her bumps have not improved.  She denies any bumps or swelling inside her mouth and says these are confined to her lips.  Denies shortness of breath, no swelling in mouth, no trouble swallowing.  Vaginal Odor: Vaginal odor that started 2 weeks ago. Patient unable to describe the smell but states it's different and she's concerned she has no discharge, no burning in vaginal area, no burning in urine. History of yeast infections and BV.  Patient has had her current sexual partner for 6 months, was recently tested for STDs three months ago.  She states this seems very similar to past instances where she has had yeast infections and BV.  ROS: pertinent noted in the HPI   Pertinent PMH, PSH, FH, SoHx: Seasonal allergies.  Objective:  BP 124/70   Pulse (!) 104   Ht 5\' 5"  (1.651 m)   Wt 167 lb (75.8 kg)   SpO2 97%   BMI 27.79 kg/m   Vitals and nursing note reviewed  General: NAD, pleasant, able to participate in exam HEENT: Lips with dry appearance and skin colored patches without erythema or swelling.  No swelling noted. Cardiac: RRR, no murmurs. Respiratory: CTAB, normal effort, No wheezes, rales or rhonchi GU: Vagina normal with no exterior lesions noted.  Some milky white fluid noted in posterior vaginal vault.   Assessment & Plan:    Vaginal odor Assessment: Possible etiologies include candidiasis, BV, trichomonas or other STDs.  Plan: -We will do wet prep and GC chlamydia today. -If these are normal we will pursue additional work-up -If any of these are positive we will pursue treatment for these -Patient also request to have HIV blood work today as well.  Lip dryness Assessment: Visual inspection of the patient's lips did not provide any concern for an allergic process.  Lips appear dry and with skin colored patches with a dry appearance.  I think it is very likely these were caused by the patient's dramatic increase in acidic fruits.  I believe it is reasonable to trial a few days without these with additional moisturization provided to the lips to see if this resolves her symptoms Plan: -Patient to use Chapstick for the next several days to provide moisture to her lips -Patient will reduce her intake of acidic fruits during this time -Patient to follow-up in 1 week if symptoms do not improve. -Return precautions given should the patient develop any concerning symptoms for allergic reaction though these are not present at this time.    , DO [facility] PGY-1

## 2020-01-14 NOTE — Patient Instructions (Addendum)
It was great to see you!  Our plans for today:  -We discussed the swelling and bumps on your lips during this appointment.  I am not concerned of allergic reaction at this point I do believe the bumps/rough patches on your lips are more related to dry lips which may be partly because of the acidity from the recent increase in oranges in your diet.  I would like for you to try keeping her lips moisturized with plain chapstick and avoiding oranges for 1 week to see if this improves.  If this is not please follow back up with [place] -If you develop any swelling of the lips, swelling of your throat or difficulty swallowing or breathing please call 911 go to the emergency room immediately. -Today we also did a vaginal exam including STDs, BV, screen for yeast infection.  I will let you know your results as they come in.  Take care and seek immediate care sooner if you develop any concerns.   Dr. [facility]

## 2020-01-15 ENCOUNTER — Other Ambulatory Visit: Payer: Self-pay | Admitting: Family Medicine

## 2020-01-15 ENCOUNTER — Other Ambulatory Visit

## 2020-01-15 LAB — CERVICOVAGINAL ANCILLARY ONLY
Chlamydia: NEGATIVE
Comment: NEGATIVE
Comment: NORMAL
Neisseria Gonorrhea: NEGATIVE

## 2020-01-15 MED ORDER — METRONIDAZOLE 500 MG PO TABS: 500.0000 mg | ORAL_TABLET | Freq: Two times a day (BID) | ORAL | 0 refills | Status: AC

## 2020-01-15 NOTE — Telephone Encounter (Signed)
Called patient to inform her of her lab results suggesting bacterial vaginosis.  This also correlates with her clinical symptoms.  Spoke to the patient about the fact that it does not appear she is on any contraception per her chart.  I informed the patient that some studies do suggest metronidazole can cause birth defects when taken in the first trimester and that this was important to discuss with any patient of childbearing age not on contraception.  Patient states she understands this risk and would like to proceed with the treatment.  I will provide the patient with 7 days of 500 mg metronidazole twice daily.  Patient plans to follow-up with us if symptoms worsen or do not improve. 

## 2020-01-15 NOTE — Progress Notes (Signed)
Called patient to inform her of her lab results suggesting bacterial vaginosis.  This also correlates with her clinical symptoms.  Spoke to the patient about the fact that it does not appear she is on any contraception per her chart.  I informed the patient that some studies do suggest metronidazole can cause birth defects when taken in the first trimester and that this was important to discuss with any patient of childbearing age not on contraception.  Patient states she understands this risk and would like to proceed with the treatment.  I will provide the patient with 7 days of 500 mg metronidazole twice daily.  Patient plans to follow-up with [place] if symptoms worsen or do not improve.

## 2020-01-19 ENCOUNTER — Other Ambulatory Visit: Payer: Self-pay | Admitting: Family Medicine

## 2020-01-19 DIAGNOSIS — Z309 Encounter for contraceptive management, unspecified: Secondary | ICD-10-CM

## 2020-01-21 ENCOUNTER — Ambulatory Visit: Admitting: Family Medicine

## 2020-01-21 ENCOUNTER — Ambulatory Visit

## 2020-01-22 ENCOUNTER — Other Ambulatory Visit: Payer: Self-pay

## 2020-01-22 ENCOUNTER — Ambulatory Visit (INDEPENDENT_AMBULATORY_CARE_PROVIDER_SITE_OTHER): Admitting: Family Medicine

## 2020-01-22 VITALS — BP 128/78 | HR 87 | Wt 169.2 lb

## 2020-01-22 DIAGNOSIS — B379 Candidiasis, unspecified: Secondary | ICD-10-CM | POA: Diagnosis not present

## 2020-01-22 DIAGNOSIS — Z30430 Encounter for insertion of intrauterine contraceptive device: Secondary | ICD-10-CM

## 2020-01-22 DIAGNOSIS — Z3009 Encounter for other general counseling and advice on contraception: Secondary | ICD-10-CM | POA: Diagnosis not present

## 2020-01-22 LAB — POCT URINE PREGNANCY: Preg Test, Ur: NEGATIVE

## 2020-01-22 MED ORDER — FLUCONAZOLE 150 MG PO TABS: 150.0000 mg | ORAL_TABLET | Freq: Once | ORAL | 0 refills | Status: AC

## 2020-01-22 MED ORDER — FLUCONAZOLE 150 MG PO TABS: 150.0000 mg | ORAL_TABLET | Freq: Once | ORAL | 0 refills | Status: DC

## 2020-01-22 NOTE — Patient Instructions (Addendum)
Dear Susan Crawford,   It was good to see you! Thank you for taking your time to come in to be seen. Today, we discussed the following:   Contraception   IUD placement today. You did really well!   Expect some bleeding and cramping in the next few days.   I have sent the medication to your pharmacy for yeast infection   Please have your records for pap smear sent from [place] at your earliest convenience   Be well,   Genia Hotter, M.D   [facility] [phone]  *Sign up for MyChart for instant access to your health profile, labs, orders, upcoming appointments or to contact your provider with questions*  ===================================================================================  Intrauterine Device Insertion, Care After  This sheet gives you information about how to care for yourself after your procedure. Your health care provider may also give you more specific instructions. If you have problems or questions, contact your health care provider. What can I expect after the procedure? After the procedure, it is common to have:  Cramps and pain in the abdomen.  Light bleeding (spotting) or heavier bleeding that is like your menstrual period. This may last for up to a few days.  Lower back pain.  Dizziness.  Headaches.  Nausea. Follow these instructions at home:  Before resuming sexual activity, check to make sure that you can feel the IUD string(s). You should be able to feel the end of the string(s) below the opening of your cervix. If your IUD string is in place, you may resume sexual activity. ? If you had a hormonal IUD inserted more than 7 days after your most recent period started, you will need to use a backup method of birth control for 7 days after IUD insertion. Ask your health care provider whether this applies to you.  Continue to check that the IUD is still in place by feeling for the string(s) after every menstrual period, or once a  month.  Take over-the-counter and prescription medicines only as told by your health care provider.  Do not drive or use heavy machinery while taking prescription pain medicine.  Keep all follow-up visits as told by your health care provider. This is important. Contact a health care provider if:  You have bleeding that is heavier or lasts longer than a normal menstrual cycle.  You have a fever.  You have cramps or abdominal pain that get worse or do not get better with medicine.  You develop abdominal pain that is new or is not in the same area of earlier cramping and pain.  You feel lightheaded or weak.  You have abnormal or bad-smelling discharge from your vagina.  You have pain during sexual activity.  You have any of the following problems with your IUD string(s): ? The string bothers or hurts you or your sexual partner. ? You cannot feel the string. ? The string has gotten longer.  You can feel the IUD in your vagina.  You think you may be pregnant, or you miss your menstrual period.  You think you may have an STI (sexually transmitted infection). Get help right away if:  You have flu-like symptoms.  You have a fever and chills.  You can feel that your IUD has slipped out of place. Summary  After the procedure, it is common to have cramps and pain in the abdomen. It is also common to have light bleeding (spotting) or heavier bleeding that is like your menstrual period.  Continue to check that the IUD is still in place by feeling for the string(s) after every menstrual period, or once a month.  Keep all follow-up visits as told by your health care provider. This is important.  Contact your health care provider if you have problems with your IUD string(s), such as the string getting longer or bothering you or your sexual partner. This information is not intended to replace advice given to you by your health care provider. Make sure you discuss any questions you  have with your health care provider. Document Revised: 11/22/2017 Document Reviewed: 10/31/2016 Elsevier Patient Education  2020 [employer].

## 2020-01-22 NOTE — Progress Notes (Signed)
Subjective  Susan Crawford is a 34 y.o. female who presents to clinic today with the following problems:  Vaginal Itching  Just treated for BV. Symptoms started after tx for BV. Denies any thick discharge. She is worried that her partner's cologne (which he sprays all over his body) may be causing the irritation.   Contraception Has had POP (couldn't remember to take), implanon, depo (once) and IUD. She would like to get another IUD placed today.   Objective  Physical Exam BP 128/78   Pulse 87   Wt 169 lb 3.2 oz (76.7 kg)   LMP 01/09/2020 (Exact Date)   SpO2 100%   BMI 28.16 kg/m  General: Well appearing female, NAD.  GU: External vulva and vagina nonerythematous, without any obvious lesions or rash.  thick white discharge appreciated. Normal ruggae of vaginal walls.  Parous cervix is non erythematous and non-friable.  There is no cervical motion tenderness, masses or gross abnormalities appreciated during bimanual exam. Uterus is retroverted.    IUD Intrauterine Device Insertion Procedure Note PRE-OP DIAGNOSIS: desired long-term, reversible contraception  POST-OP DIAGNOSIS: Same  PROCEDURE: IUD placement Performing Physician: Zettie Cooley, MD Supervising Physician (if applicable): n/a   Checklist: Multiple Partners   [_] Yes     [x]  No Dysmenorrhea   [x]  Yes     [_] No Copper Allergy   [_] Yes     [x]  No PID/STD    [_] Yes     [x]  No Ectopic Pregnancy   [_] Yes     [x]  No Breastfeeding    [_] Yes     [x]  No UPT  [x]  Negative   [_] Positive   IUD type: [x]  Mirena     PROCEDURE:  Timeout procedure was performed to ensure right patient and right site.  A bimanual exam was performed to determine the position of the uterus. The speculum was placed. The vagina and cervix was sterilized in the usual manner and sterile technique was maintained throughout the course of the procedure. A single toothed tenaculum was applied to the posterior lip of the cervix and gentle traction  applied to straighten and stabilize it. The depth of the uterus was sounded to be of appropriate depth of 7 cm. With gentle traction on the tenaculum, the IUD was inserted to the appropriate depth and deposited by withdrawing on the insertion tube holding the rod steady.  The string was cut to an estimated 4 cm length. Bleeding was minimal. The patient tolerated the procedure well.    Followup: The patient tolerated the procedure well without complications.  Standard post-procedure care is explained and return precautions are given.   Assessment & Plan    Problem List Items Addressed This Visit      Active Problems   Candida infection    Patient recently on antibiotics for BV.  Today, she presents with vaginal itching.  On exam, she does have thick white discharge.  Will send Diflucan to the pharmacy.      Relevant Medications   fluconazole (DIFLUCAN) 150 MG tablet   Encounter for insertion of mirena IUD    Discussed birth control with patient. She chose to do the IUD as she believes this is the overall easiest for her.  She has an appointment to see the gynecologist for possible BTL in a few months.  The significance of this was discussed and patient remained agreeable for IUD.  She tolerated the procedure well.       Other Visit  Diagnoses    Encounter for counseling regarding contraception    -  Primary   Relevant Orders   POCT urine pregnancy (Completed)      Melene Plan, M.D.  10:56 AM 01/22/2020

## 2020-01-22 NOTE — Assessment & Plan Note (Addendum)
Discussed birth control with patient. She chose to do the IUD as she believes this is the overall easiest for her.  She has an appointment to see the gynecologist for possible BTL in a few months.  The significance of this was discussed and patient remained agreeable for IUD.  She tolerated the procedure well.

## 2020-01-22 NOTE — Assessment & Plan Note (Signed)
Patient recently on antibiotics for BV.  Today, she presents with vaginal itching.  On exam, she does have thick white discharge.  Will send Diflucan to the pharmacy.

## 2020-01-25 ENCOUNTER — Telehealth: Payer: Self-pay

## 2020-01-25 ENCOUNTER — Other Ambulatory Visit: Payer: Self-pay | Admitting: Family Medicine

## 2020-01-25 MED ORDER — FLUCONAZOLE 150 MG PO TABS: 150.0000 mg | ORAL_TABLET | Freq: Once | ORAL | 0 refills | Status: AC

## 2020-01-25 NOTE — Telephone Encounter (Signed)
Pt left message on nurse line requesting additional refill on diflucan. Per patient, provider said that she could have another dose called in if symptoms did not clear up.   To PCP and Dr. Selena Batten (provider saw patient 01/22/20)  Veronda Prude, RN

## 2020-01-25 NOTE — Progress Notes (Signed)
Called patient and she reports continuing to have some very mild symptoms.  She does note much improvement from previous.  She reports that she usually requires 2 Diflucan for her yeast infections. Since second dose of Diflucan for patient to pick up at pharmacy tonight

## 2020-01-26 MED ORDER — LEVONORGESTREL 20 MCG/24HR IU IUD
1.0000 | INTRAUTERINE_SYSTEM | Freq: Once | INTRAUTERINE | Status: AC
  Administered 2020-01-22: 10:00:00 1 via INTRAUTERINE

## 2020-01-26 NOTE — Addendum Note (Signed)
Addended by: Jone Baseman D on: 01/26/2020 09:33 AM   Modules accepted: Orders

## 2020-02-04 ENCOUNTER — Other Ambulatory Visit: Payer: Self-pay

## 2020-02-04 ENCOUNTER — Encounter (HOSPITAL_COMMUNITY): Payer: Self-pay

## 2020-02-04 ENCOUNTER — Ambulatory Visit (HOSPITAL_COMMUNITY)
Admission: EM | Admit: 2020-02-04 | Discharge: 2020-02-04 | Disposition: A | Discharge status: Home / Self Care | Attending: Family Medicine | Admitting: Family Medicine

## 2020-02-04 DIAGNOSIS — Z20822 Contact with and (suspected) exposure to covid-19: Secondary | ICD-10-CM | POA: Diagnosis present

## 2020-02-04 NOTE — Discharge Instructions (Signed)
Go home to rest Drink plenty of fluids Take Tylenol for pain or fever You may take over-the-counter cough and cold medicines as needed You must quarantine at home until your test result is available You can check for your test result in MyChart  

## 2020-02-04 NOTE — ED Triage Notes (Signed)
Pt presents for covid testing after an exposure; pt is not displaying any symptoms. 

## 2020-02-04 NOTE — ED Provider Notes (Signed)
[facility]    CSN: [id number] Arrival date & time: 02/04/20  1427      History   Chief Complaint Chief Complaint  Patient presents with  . Covid Exposure    HPI Susan Crawford is a 34 y.o. female.   HPI   Patient states they have been exposed to Covid.  They desire Covid testing. Denies any Covid symptoms.  No fever chills, body ache headache, cough or shortness of breath.  No change in appetite, taste or smell.   Past Medical History:  Diagnosis Date  . Asthma   . Preterm labor     Patient Active Problem List   Diagnosis Date Noted  . Encounter for insertion of mirena IUD 01/22/2020  . Candida infection 10/29/2019  . ASTHMA, PERSISTENT 09/25/2010  . ACID REFLUX DISEASE 12/08/2009  . ALLERGIC RHINITIS 10/18/2008  . ELEVATED BP READING WITHOUT DX HYPERTENSION 10/18/2008    Past Surgical History:  Procedure Laterality Date  . BREAST LUMPECTOMY      OB History    Gravida  2   Para  2   Term  2   Preterm  0   AB      Living  2     SAB      TAB      Ectopic      Multiple      Live Births  2            Home Medications    Prior to Admission medications   Medication Sig Start Date End Date Taking? Authorizing Provider  acetaminophen (TYLENOL) 500 MG tablet Take 2 tablets (1,000 mg total) by mouth every 8 (eight) hours as needed for moderate pain. 10/18/19   Law, Bea Graff, PA-C  cetirizine-pseudoephedrine (ZYRTEC-D) 5-120 MG tablet Take 1 tablet by mouth daily. 10/18/19   Law, Bea Graff, PA-C  ibuprofen (ADVIL) 600 MG tablet Take 1 tablet (600 mg total) by mouth every 6 (six) hours as needed. 10/18/19   Frederica Kuster, PA-C    Family History Family History  Problem Relation Age of Onset  . Asthma Mother   . Hypertension Mother   . Hypertension Maternal Grandmother   . Asthma Maternal Grandmother   . Other Neg Hx     Social History Social History   Tobacco Use  . Smoking status: Never Smoker  . Smokeless  tobacco: Never Used  Substance Use Topics  . Alcohol use: No    Comment: rare  . Drug use: No     Allergies   Patient has no known allergies.   Review of Systems Review of Systems  See HPI Physical Exam Triage Vital Signs ED Triage Vitals [02/04/20 1543]  Enc Vitals Group     BP (!) 143/83     Pulse Rate 95     Resp 18     Temp 98.5 F (36.9 C)     Temp Source Oral     SpO2 100 %     Weight      Height      Head Circumference      Peak Flow      Pain Score 0     Pain Loc      Pain Edu?      Excl. in [place]?    No data found.  Updated Vital Signs BP (!) 143/83 (BP Location: Right Arm)   Pulse 95   Temp 98.5 F (36.9 C) (Oral)   Resp 18  LMP 01/09/2020 (Exact Date)   SpO2 100%      Physical Exam Constitutional:      General: She is not in acute distress.    Appearance: She is well-developed. She is not ill-appearing.     Comments: Exam by observation  HENT:     Head: Normocephalic and atraumatic.  Eyes:     Conjunctiva/sclera: Conjunctivae normal.     Pupils: Pupils are equal, round, and reactive to light.  Cardiovascular:     Rate and Rhythm: Normal rate.  Pulmonary:     Effort: Pulmonary effort is normal. No respiratory distress.  Musculoskeletal:        General: Normal range of motion.     Cervical back: Normal range of motion.  Skin:    General: Skin is warm and dry.  Neurological:     Mental Status: She is alert.  Psychiatric:        Mood and Affect: Mood normal.        Behavior: Behavior normal.      UC Treatments / Results  Labs (all labs ordered are listed, but only abnormal results are displayed) Labs Reviewed  NOVEL CORONAVIRUS, NAA (HOSP ORDER, SEND-OUT TO REF LAB; TAT 18-24 HRS)    EKG   Radiology No results found.  Procedures Procedures (including critical care time)  Medications Ordered in UC Medications - No data to display  Initial Impression / Assessment and Plan / UC Course  I have reviewed the triage vital  signs and the nursing notes.  Pertinent labs & imaging results that were available during my care of the patient were reviewed by me and considered in my medical decision making (see chart for details).      Final Clinical Impressions(s) / UC Diagnoses   Final diagnoses:  Contact with and (suspected) exposure to covid-19     Discharge Instructions     Go home to rest Drink plenty of fluids Take Tylenol for pain or fever You may take over-the-counter cough and cold medicines as needed You must quarantine at home until your test result is available You can check for your test result in MyChart    ED Prescriptions    None     PDMP not reviewed this encounter.   Eustace Moore, MD 02/04/20 [phone]

## 2020-02-06 LAB — NOVEL CORONAVIRUS, NAA (HOSP ORDER, SEND-OUT TO REF LAB; TAT 18-24 HRS): SARS-CoV-2, NAA: NOT DETECTED

## 2020-02-25 ENCOUNTER — Encounter: Admitting: Obstetrics and Gynecology

## 2020-02-25 ENCOUNTER — Encounter: Payer: Self-pay | Admitting: Obstetrics and Gynecology

## 2020-02-28 NOTE — Progress Notes (Signed)
Patient did not keep her GYN referral appointment for 02/25/2020.  Cornelia Copa MD Attending Center for [employer] [facility])

## 2020-04-07 ENCOUNTER — Ambulatory Visit (INDEPENDENT_AMBULATORY_CARE_PROVIDER_SITE_OTHER): Admitting: Family Medicine

## 2020-04-07 ENCOUNTER — Other Ambulatory Visit (HOSPITAL_COMMUNITY)
Admission: RE | Admit: 2020-04-07 | Discharge: 2020-04-07 | Disposition: A | Discharge status: Home / Self Care | Source: Ambulatory Visit | Attending: Family Medicine | Admitting: Family Medicine

## 2020-04-07 ENCOUNTER — Other Ambulatory Visit: Payer: Self-pay

## 2020-04-07 VITALS — BP 104/76 | HR 91 | Ht 65.0 in | Wt 175.4 lb

## 2020-04-07 DIAGNOSIS — B9689 Other specified bacterial agents as the cause of diseases classified elsewhere: Secondary | ICD-10-CM

## 2020-04-07 DIAGNOSIS — N760 Acute vaginitis: Secondary | ICD-10-CM | POA: Diagnosis not present

## 2020-04-07 DIAGNOSIS — R8290 Unspecified abnormal findings in urine: Secondary | ICD-10-CM | POA: Insufficient documentation

## 2020-04-07 DIAGNOSIS — Z124 Encounter for screening for malignant neoplasm of cervix: Secondary | ICD-10-CM

## 2020-04-07 DIAGNOSIS — N898 Other specified noninflammatory disorders of vagina: Secondary | ICD-10-CM

## 2020-04-07 DIAGNOSIS — J302 Other seasonal allergic rhinitis: Secondary | ICD-10-CM

## 2020-04-07 LAB — POCT WET PREP (WET MOUNT)
Clue Cells Wet Prep Whiff POC: POSITIVE
Trichomonas Wet Prep HPF POC: ABSENT

## 2020-04-07 LAB — POCT URINALYSIS DIP (MANUAL ENTRY)
Bilirubin, UA: NEGATIVE
Glucose, UA: NEGATIVE mg/dL
Leukocytes, UA: NEGATIVE
Nitrite, UA: NEGATIVE
Spec Grav, UA: 1.025 (ref 1.010–1.025)
Urobilinogen, UA: 1 E.U./dL
pH, UA: 6 (ref 5.0–8.0)

## 2020-04-07 LAB — POCT UA - MICROSCOPIC ONLY

## 2020-04-07 MED ORDER — LEVOCETIRIZINE DIHYDROCHLORIDE 5 MG PO TABS: 5.0000 mg | ORAL_TABLET | Freq: Every evening | ORAL | 0 refills | Status: DC

## 2020-04-07 NOTE — Patient Instructions (Signed)
It was great seeing you today!  We are going to do 2 separate swabs for your vaginal odor.  I will give you call in results come back.  We also do some blood work to check and complete the screen for STDs.  I also performed a Pap smear since you are overdue for this today. I sent in Xyzal to your pharmacy and gave you a good Rx coupon for this.

## 2020-04-08 ENCOUNTER — Other Ambulatory Visit: Payer: Self-pay | Admitting: Family Medicine

## 2020-04-08 ENCOUNTER — Encounter: Payer: Self-pay | Admitting: Family Medicine

## 2020-04-08 DIAGNOSIS — B9689 Other specified bacterial agents as the cause of diseases classified elsewhere: Secondary | ICD-10-CM | POA: Insufficient documentation

## 2020-04-08 DIAGNOSIS — N760 Acute vaginitis: Secondary | ICD-10-CM | POA: Insufficient documentation

## 2020-04-08 LAB — RPR: RPR Ser Ql: NONREACTIVE

## 2020-04-08 LAB — HIV ANTIBODY (ROUTINE TESTING W REFLEX): HIV Screen 4th Generation wRfx: NONREACTIVE

## 2020-04-08 MED ORDER — METRONIDAZOLE 500 MG PO TABS: 500.0000 mg | ORAL_TABLET | Freq: Two times a day (BID) | ORAL | 0 refills | Status: DC

## 2020-04-08 NOTE — Progress Notes (Signed)
   CHIEF COMPLAINT / HPI: 34 year old female who presents for cloudy urine, vaginal discharge, and allergies.  Patient states that her allergies have been really bad recently.  She has having a lot of rhinorrhea, congestion, and eye itching.  Has been taking Zyrtec 5 mg.  She is interested in taking another agent to help with her allergies.  Patient has been having a clear foul-smelling discharge recently.  She believes it is another bacterial vaginosis episode.  She has been sexually active and would also like STD testing.  Of note the patient does have a Mirena IUD.  Of note the patient overdue for Pap smear  The patient states that she is having some cloudy urine. She is also concerned that she might have a urinary tract infection and would like urine testing.  PERTINENT  PMH / PSH:    OBJECTIVE: BP 104/76   Pulse 91   Ht 5\' 5"  (1.651 m)   Wt 175 lb 6.4 oz (79.6 kg)   LMP 03/24/2020 (Approximate)   SpO2 100%   BMI 29.19 kg/m   Gen: Very pleasant 34 year old female, no acute distress CV: Skin warm and dry Resp: No accessory muscle use Neuro: Alert and oriented, Speech clear, No gross deficits  GU: Bloody discharge consistent with menstrual cycle.  Cervix difficult to visualize.  Clear vaginal discharge noted. UA: Large blood, trace ketones, high-powered field: 1-3 RBCs, 10-20 epithelial cells. Wet prep: Many bacteria, moderate clue cells, positive whiff  ASSESSMENT / PLAN:  Bacterial vaginosis Patient with bacterial vaginosis on wet prep.  Will treat with Flagyl 500 mg twice daily for 7 days.  Await GC/chlamydia.  RPR/HIV negative.  Seasonal allergies Patient with seasonal allergies.  Has been taking Zyrtec for a long time, feels that it is not as effective anymore.  We will switch patient to Xyzal 5 mg daily.  Can follow-up as needed for this issue.    20 MD PGY-3 Family Medicine Resident [facility]

## 2020-04-08 NOTE — Assessment & Plan Note (Signed)
Patient with bacterial vaginosis on wet prep.  Will treat with Flagyl 500 mg twice daily for 7 days.  Await GC/chlamydia.  RPR/HIV negative.

## 2020-04-08 NOTE — Assessment & Plan Note (Signed)
Patient with seasonal allergies.  Has been taking Zyrtec for a long time, feels that it is not as effective anymore.  We will switch patient to Xyzal 5 mg daily.  Can follow-up as needed for this issue.

## 2020-04-08 NOTE — Progress Notes (Signed)
Family medicine telephone note  Informed patient of her positive bacterial vaginosis on her wet prep.  Informed her that RPR and HIV blood testing was negative.  Informed her that her UA and UTI were not consistent with urinary tract infection especially without any symptoms.  GC/chlamydia still pending.  Will let patient know if that or her Pap smear comes back with an abnormal result.  Sent in Flagyl 500 mg twice daily for 7 days to her pharmacy, I did confirm that she would like the [facility] on [address].  Patient appreciative of call, all questions answered.  Myrene Buddy MD PGY-3 Family Medicine Resident

## 2020-04-11 LAB — CERVICOVAGINAL ANCILLARY ONLY
Chlamydia: NEGATIVE
Comment: NEGATIVE
Comment: NEGATIVE
Comment: NORMAL
Neisseria Gonorrhea: NEGATIVE
Trichomonas: NEGATIVE

## 2020-04-11 LAB — CYTOLOGY - PAP
Comment: NEGATIVE
Diagnosis: NEGATIVE
High risk HPV: NEGATIVE

## 2020-05-09 ENCOUNTER — Other Ambulatory Visit: Payer: Self-pay

## 2020-05-09 ENCOUNTER — Other Ambulatory Visit (HOSPITAL_COMMUNITY)
Admission: RE | Admit: 2020-05-09 | Discharge: 2020-05-09 | Disposition: A | Discharge status: Home / Self Care | Source: Ambulatory Visit | Attending: Family Medicine | Admitting: Family Medicine

## 2020-05-09 ENCOUNTER — Ambulatory Visit (INDEPENDENT_AMBULATORY_CARE_PROVIDER_SITE_OTHER): Admitting: Family Medicine

## 2020-05-09 VITALS — BP 112/84 | HR 74 | Ht 65.0 in | Wt 174.6 lb

## 2020-05-09 DIAGNOSIS — N898 Other specified noninflammatory disorders of vagina: Secondary | ICD-10-CM

## 2020-05-09 DIAGNOSIS — J302 Other seasonal allergic rhinitis: Secondary | ICD-10-CM

## 2020-05-09 DIAGNOSIS — B9689 Other specified bacterial agents as the cause of diseases classified elsewhere: Secondary | ICD-10-CM

## 2020-05-09 DIAGNOSIS — N760 Acute vaginitis: Secondary | ICD-10-CM

## 2020-05-09 LAB — POCT WET PREP (WET MOUNT)
Clue Cells Wet Prep Whiff POC: POSITIVE
Trichomonas Wet Prep HPF POC: ABSENT

## 2020-05-09 MED ORDER — FLUTICASONE PROPIONATE 50 MCG/ACT NA SUSP: 2.0000 | Freq: Every day | NASAL | 6 refills | Status: DC

## 2020-05-09 MED ORDER — METRONIDAZOLE 500 MG PO TABS: 500.0000 mg | ORAL_TABLET | Freq: Two times a day (BID) | ORAL | 0 refills | Status: DC

## 2020-05-09 NOTE — Assessment & Plan Note (Signed)
Sinus congestion and rhinorrhea, no pain, no fever  Has been taking Xyzal and is requesting nasal spray, Flonase prescribed

## 2020-05-09 NOTE — Assessment & Plan Note (Signed)
Patient with recurring vaginal discharge and odor that she says is consistent with prior treated BV from April a month ago.  States she has been monogamous with one partner and also believes he has been monogamous.  Says that she is safe at home and no one is making her do anything she does not want to do.  HIV -1 neg month ago  Swab for GC/BV/trichomoniasis/yeast infection.

## 2020-05-09 NOTE — Patient Instructions (Signed)
Today we talked about your complaint of vaginal discharge, we will run these swabs and then we will let you know what the results are.  If there is anything that needs to be treated we will send that medication to your pharmacy.  I hope the Flonase helps out your seasonal allergies, please remember to try and blow out as opposed to sniffling in.  We also discussed some of your answers during our depression and anxiety screening, I am glad that you think you are doing well and are just frustrated with your allergies, if that ever changes and you need to talk about anything else further please let us know.  Dr. Parke Simmers

## 2020-05-09 NOTE — Progress Notes (Signed)
    SUBJECTIVE:   CHIEF COMPLAINT / HPI:   Vaginal discharge Patient with recurring vaginal discharge and odor that she says is consistent with prior treated BV from April a month ago.  States she has been monogamous with one partner and also believes he has been monogamous.  Says that she is safe at home and no one is making her do anything she does not want to do.  HIV -1 neg month ago.  No dysuria/abdominal pain/bowel symptoms  Seasonal allergies Sinus congestion and rhinorrhea, no pain, no fever  PERTINENT  PMH / PSH: Does have Mirena IUD  OBJECTIVE:   BP 112/84   Pulse 74   Ht 5\' 5"  (1.651 m)   Wt 174 lb 9.6 oz (79.2 kg)   LMP 04/24/2020 (Approximate)   SpO2 100%   BMI 29.05 kg/m   General: Alert pleasant, no distress Cardiac: Regular rate Respiratory: No cough, no increased work of breathing, regular rate GU: *Examined performed entirely with CMA April in the room*no externally visualized pathologic lesions or sores, no rashes, there was slight white discharge, no bleeding, no pathologic lesions visualized internally.  No abnormal pain with exam  ASSESSMENT/PLAN:   Vaginal discharge Patient with recurring vaginal discharge and odor that she says is consistent with prior treated BV from April a month ago.  States she has been monogamous with one partner and also believes he has been monogamous.  Says that she is safe at home and no one is making her do anything she does not want to do.  HIV -1 neg month ago  Swab for GC/BV/trichomoniasis/yeast infection.  Seasonal allergies Sinus congestion and rhinorrhea, no pain, no fever  Has been taking Xyzal and is requesting nasal spray, Flonase prescribed   *Wet prep positive for BV, metronidazole ordered and patient notified on the phone.  Other lab test still pending at time of this note  May, DO [facility]

## 2020-05-10 LAB — CERVICOVAGINAL ANCILLARY ONLY
Chlamydia: NEGATIVE
Comment: NEGATIVE
Comment: NORMAL
Neisseria Gonorrhea: NEGATIVE

## 2020-05-11 ENCOUNTER — Encounter: Payer: Self-pay | Admitting: Family Medicine

## 2020-05-19 ENCOUNTER — Encounter: Payer: Self-pay | Admitting: Family Medicine

## 2020-05-19 ENCOUNTER — Ambulatory Visit (INDEPENDENT_AMBULATORY_CARE_PROVIDER_SITE_OTHER): Admitting: Family Medicine

## 2020-05-19 ENCOUNTER — Other Ambulatory Visit: Payer: Self-pay

## 2020-05-19 VITALS — BP 128/72 | HR 88 | Wt 176.0 lb

## 2020-05-19 DIAGNOSIS — L299 Pruritus, unspecified: Secondary | ICD-10-CM | POA: Diagnosis not present

## 2020-05-19 DIAGNOSIS — N898 Other specified noninflammatory disorders of vagina: Secondary | ICD-10-CM | POA: Diagnosis not present

## 2020-05-19 LAB — POCT WET PREP (WET MOUNT)
Clue Cells Wet Prep Whiff POC: NEGATIVE
Trichomonas Wet Prep HPF POC: ABSENT

## 2020-05-19 NOTE — Patient Instructions (Signed)
It was great seeing you today!  I am sorry about your itching.  We performed a wet prep to check for yeast.  I will give you call with these results when they become available this afternoon.

## 2020-05-20 MED ORDER — FLUCONAZOLE 150 MG PO TABS: ORAL_TABLET | ORAL | 0 refills | Status: DC

## 2020-05-20 NOTE — Progress Notes (Signed)
   CHIEF COMPLAINT / HPI: 34 year old female who presents for 2 to 3-day history of vaginal itching.  She also states that she has had a thick white discharge.  She has had a long history of bacterial vaginosis but states that she feels that this is very different.  No other symptoms.  States that there is no possibility she might be pregnant as she always uses protection.  Of note she was given Flagyl back on 05/09/2020.  PERTINENT  PMH / PSH:    OBJECTIVE: BP 128/72   Pulse 88   Wt 176 lb (79.8 kg)   LMP 04/24/2020 (Approximate)   SpO2 99%   BMI 29.29 kg/m   Gen: 34 year old African-American female, no acute distress, very pleasant Resp: No accessory muscle use, no respiratory distress Neuro: Alert and oriented, Speech clear, No gross deficits  GU: Thick white discharge noted.  Cervix easily visualized, no signs of atrophy. Wet prep: Few yeast, otherwise no abnormality  ASSESSMENT / PLAN:  Vaginal discharge Consistent with yeast infection, likely secondary to antibiotic use.  Will treat with Diflucan 150 mg x 1, second dose to be given if symptoms persist for more than 3 days.  States that there is no way she could be pregnant, UPT deferred.     Myrene Buddy MD PGY-3 Family Medicine Resident [facility]

## 2020-05-20 NOTE — Assessment & Plan Note (Signed)
Consistent with yeast infection, likely secondary to antibiotic use.  Will treat with Diflucan 150 mg x 1, second dose to be given if symptoms persist for more than 3 days.  States that there is no way she could be pregnant, UPT deferred.

## 2020-06-21 ENCOUNTER — Other Ambulatory Visit: Payer: Self-pay

## 2020-06-21 ENCOUNTER — Ambulatory Visit (INDEPENDENT_AMBULATORY_CARE_PROVIDER_SITE_OTHER): Admitting: Family Medicine

## 2020-06-21 VITALS — BP 122/72 | HR 84 | Ht 65.0 in | Wt 177.2 lb

## 2020-06-21 DIAGNOSIS — N898 Other specified noninflammatory disorders of vagina: Secondary | ICD-10-CM | POA: Diagnosis not present

## 2020-06-21 LAB — POCT WET PREP (WET MOUNT)
Clue Cells Wet Prep Whiff POC: POSITIVE
Trichomonas Wet Prep HPF POC: ABSENT

## 2020-06-21 MED ORDER — CLINDAMYCIN PHOSPHATE 2 % VA CREA: 1.0000 | TOPICAL_CREAM | Freq: Every day | VAGINAL | 0 refills | Status: DC

## 2020-06-21 NOTE — Assessment & Plan Note (Signed)
Wet prep positive for clue cells and positive whiff. Given she has been on metronidazole 3 times within the past 6 months and her symptoms continue to return I will treat with Clindamycin 2% vaginal cream once nightly for 7 days.

## 2020-06-21 NOTE — Progress Notes (Signed)
    SUBJECTIVE:   CHIEF COMPLAINT / HPI:   Vaginal Odor Patient here for return of vaginal odor and some scant discharge. She states this has been occurring "almost every month" for the past few months. She has been treated 3 times since January with metronidazole for BV after having positive whiff tests and clue cells. She also had a yeast infection and was given diflucan. She declines STD testing saying she is with one partner he is not a new sexual partner and she is using protection. No other symptoms such as fever, chills, abdominal pain, nausea, or vomting.  PERTINENT  PMH / PSH: Hx of BV  OBJECTIVE:   BP 122/72   Pulse 84   Ht 5\' 5"  (1.651 m)   Wt 177 lb 3.2 oz (80.4 kg)   LMP 06/17/2020 (Exact Date)   SpO2 96%   BMI 29.49 kg/m   Gen: NAD Physical Exam Vitals reviewed. Exam conducted with a chaperone present.  Genitourinary:    Exam position: Supine.     Labia:        Right: No rash, tenderness, lesion or injury.        Left: No rash, tenderness, lesion or injury.      Vagina: No signs of injury and foreign body. Vaginal discharge and bleeding present. No erythema, tenderness or lesions.     Cervix: No discharge, lesion, erythema or cervical bleeding.     Uterus: Normal.       ASSESSMENT/PLAN:   Vaginal discharge Wet prep positive for clue cells and positive whiff. Given she has been on metronidazole 3 times within the past 6 months and her symptoms continue to return I will treat with Clindamycin 2% vaginal cream once nightly for 7 days.     06/19/2020, DO [place] [facility]

## 2020-06-21 NOTE — Patient Instructions (Addendum)
It was great to meet you today! Thank you for letting me participate in your care!  Today, we discussed your symptoms and I have sent in a prescription for an antibiotic. Please use it as directed. If your symptoms do not improve or return please come back to the clinic.  Be well, Jules Schick, DO PGY-3, [facility]

## 2020-08-25 ENCOUNTER — Other Ambulatory Visit: Payer: Self-pay

## 2020-08-25 ENCOUNTER — Other Ambulatory Visit (HOSPITAL_COMMUNITY)
Admission: RE | Admit: 2020-08-25 | Discharge: 2020-08-25 | Disposition: A | Discharge status: Home / Self Care | Source: Ambulatory Visit | Attending: Family Medicine | Admitting: Family Medicine

## 2020-08-25 ENCOUNTER — Ambulatory Visit

## 2020-08-25 ENCOUNTER — Ambulatory Visit (INDEPENDENT_AMBULATORY_CARE_PROVIDER_SITE_OTHER): Admitting: Family Medicine

## 2020-08-25 ENCOUNTER — Encounter: Payer: Self-pay | Admitting: Family Medicine

## 2020-08-25 ENCOUNTER — Other Ambulatory Visit: Payer: Self-pay | Admitting: Family Medicine

## 2020-08-25 VITALS — BP 106/72 | HR 87 | Ht 65.0 in | Wt 182.4 lb

## 2020-08-25 DIAGNOSIS — E669 Obesity, unspecified: Secondary | ICD-10-CM

## 2020-08-25 DIAGNOSIS — N760 Acute vaginitis: Secondary | ICD-10-CM | POA: Diagnosis not present

## 2020-08-25 DIAGNOSIS — N939 Abnormal uterine and vaginal bleeding, unspecified: Secondary | ICD-10-CM | POA: Diagnosis not present

## 2020-08-25 LAB — POCT WET PREP (WET MOUNT)
Clue Cells Wet Prep Whiff POC: NEGATIVE
Trichomonas Wet Prep HPF POC: ABSENT

## 2020-08-25 MED ORDER — NORGESTIMATE-ETH ESTRADIOL 0.25-35 MG-MCG PO TABS: 1.0000 | ORAL_TABLET | Freq: Every day | ORAL | 1 refills | Status: DC

## 2020-08-25 NOTE — Patient Instructions (Signed)
It was nice to meet you today,  I would like you to take the birth control pill once a day for 1 month at least and follow-up with me after that 1 month.  This will help hopefully to regulate your cycle.  I will call you to follow-up with the results of the other lab test that we got today.  Have a great day,  Frederic Jericho, MD  Ethinyl Estradiol; Norgestimate tablets What is this medicine? ETHINYL ESTRADIOL; NORGESTIMATE (ETH in il es tra DYE ole; nor JES ti mate) is an oral contraceptive. The products combine two types of female hormones, an estrogen and a progestin. They are used to prevent ovulation and pregnancy. Some products are also used to treat acne in females. This medicine may be used for other purposes; ask your health care provider or pharmacist if you have questions. COMMON BRAND NAME(S): Estarylla, Mili, MONO-LINYAH, MonoNessa, Norgestimate/Ethinyl Estradiol, Ortho Tri-Cyclen, Ortho Tri-Cyclen Lo, Ortho-Cyclen, Previfem, Sprintec, Tri-Estarylla, TRI-LINYAH, Tri-Lo-Estarylla, Tri-Lo-Marzia, Tri-Lo-Mili, Tri-Lo-Sprintec, Tri-Mili, Tri-Previfem, Tri-Sprintec, Tri-VyLibra, Trinessa, Trinessa Lo, VyLibra What should I tell my health care provider before I take this medicine? They need to know if you have or ever had any of these conditions:  abnormal vaginal bleeding  blood vessel disease or blood clots  breast, cervical, endometrial, ovarian, liver, or uterine cancer  diabetes  gallbladder disease  heart disease or recent heart attack  high blood pressure  high cholesterol  kidney disease  liver disease  migraine headaches  stroke  systemic lupus erythematosus (SLE)  tobacco smoker  an unusual or allergic reaction to estrogens, progestins, other medicines, foods, dyes, or preservatives  pregnant or trying to get pregnant  breast-feeding How should I use this medicine? Take this medicine by mouth. To reduce nausea, this medicine may be taken with food.  Follow the directions on the prescription label. Take this medicine at the same time each day and in the order directed on the package. Do not take your medicine more often than directed. Contact your pediatrician regarding the use of this medicine in children. Special care may be needed. This medicine has been used in female children who have started having menstrual periods. A patient package insert for the product will be given with each prescription and refill. Read this sheet carefully each time. The sheet may change frequently. Overdosage: If you think you have taken too much of this medicine contact a poison control center or emergency room at once. NOTE: This medicine is only for you. Do not share this medicine with others. What if I miss a dose? If you miss a dose, refer to the patient information sheet you received with your medicine for direction. If you miss more than one pill, this medicine may not be as effective and you may need to use another form of birth control. What may interact with this medicine? Do not take this medicine with the following medication:  dasabuvir; ombitasvir; paritaprevir; ritonavir  ombitasvir; paritaprevir; ritonavir This medicine may also interact with the following medications:  acetaminophen  antibiotics or medicines for infections, especially rifampin, rifabutin, rifapentine, and griseofulvin, and possibly penicillins or tetracyclines  aprepitant  ascorbic acid (vitamin C)  atorvastatin  barbiturate medicines, such as phenobarbital  bosentan  carbamazepine  caffeine  clofibrate  cyclosporine  dantrolene  doxercalciferol  felbamate  grapefruit juice  hydrocortisone  medicines for anxiety or sleeping problems, such as diazepam or temazepam  medicines for diabetes, including pioglitazone  mineral oil  modafinil  mycophenolate  nefazodone  oxcarbazepine  phenytoin  prednisolone  ritonavir or other medicines for  HIV infection or AIDS  rosuvastatin  selegiline  soy isoflavones supplements  St. John's wort  tamoxifen or raloxifene  theophylline  thyroid hormones  topiramate  warfarin This list may not describe all possible interactions. Give your health care provider a list of all the medicines, herbs, non-prescription drugs, or dietary supplements you use. Also tell them if you smoke, drink alcohol, or use illegal drugs. Some items may interact with your medicine. What should I watch for while using this medicine? Visit your doctor or health care professional for regular checks on your progress. You will need a regular breast and pelvic exam and Pap smear while on this medicine. You should also discuss the need for regular mammograms with your health care professional, and follow his or her guidelines for these tests. This medicine can make your body retain fluid, making your fingers, hands, or ankles swell. Your blood pressure can go up. Contact your doctor or health care professional if you feel you are retaining fluid. Use an additional method of contraception during the first cycle that you take these tablets. If you have any reason to think you are pregnant, stop taking this medicine right away and contact your doctor or health care professional. If you are taking this medicine for hormone related problems, it may take several cycles of use to see improvement in your condition. Do not use this product if you smoke and are over 44 years of age. Smoking increases the risk of getting a blood clot or having a stroke while you are taking birth control pills, especially if you are more than 35 years old. If you are a smoker who is 40 years of age or younger, you are strongly advised not to smoke while taking birth control pills. This medicine can make you more sensitive to the sun. Keep out of the sun. If you cannot avoid being in the sun, wear protective clothing and use sunscreen. Do not use sun  lamps or tanning beds/booths. If you wear contact lenses and notice visual changes, or if the lenses begin to feel uncomfortable, consult your eye care specialist. In some women, tenderness, swelling, or minor bleeding of the gums may occur. Notify your dentist if this happens. Brushing and flossing your teeth regularly may help limit this. See your dentist regularly and inform your dentist of the medicines you are taking. If you are going to have elective surgery, you may need to stop taking this medicine before the surgery. Consult your health care professional for advice. This medicine does not protect you against HIV infection (AIDS) or any other sexually transmitted diseases. What side effects may I notice from receiving this medicine? Side effects that you should report to your doctor or health care professional as soon as possible:  breast tissue changes or discharge  changes in vaginal bleeding during your period or between your periods  chest pain  coughing up blood  dizziness or fainting spells  headaches or migraines  leg, arm or groin pain  severe or sudden headaches  stomach pain (severe)  sudden shortness of breath  sudden loss of coordination, especially on one side of the body  speech problems  symptoms of vaginal infection like itching, irritation or unusual discharge  tenderness in the upper abdomen  vomiting  weakness or numbness in the arms or legs, especially on one side of the body  yellowing of the eyes or skin Side effects that  usually do not require medical attention (report to your doctor or health care professional if they continue or are bothersome):  breakthrough bleeding and spotting that continues beyond the 3 initial cycles of pills  breast tenderness  mood changes, anxiety, depression, frustration, anger, or emotional outbursts  increased sensitivity to sun or ultraviolet light  nausea  skin rash, acne, or brown spots on the  skin  weight gain (slight) This list may not describe all possible side effects. Call your doctor for medical advice about side effects. You may report side effects to FDA at 1-800-FDA-1088. Where should I keep my medicine? Keep out of the reach of children. Store at room temperature between 15 and 30 degrees C (59 and 86 degrees F). Throw away any unused medicine after the expiration date. NOTE: This sheet is a summary. It may not cover all possible information. If you have questions about this medicine, talk to your doctor, pharmacist, or health care provider.  2020 Elsevier/Gold Standard (2016-08-20 08:09:09)

## 2020-08-25 NOTE — Progress Notes (Addendum)
    SUBJECTIVE:   CHIEF COMPLAINT / HPI:   Vaginal bleeding: Patient has had 3 weeks of spotting.  Has been using a panty liner to help control the bleeding which she changes every time she uses the bathroom.  Spotting is about the size of 1 of those dollar coins according to the patient.  She has been feeling more tired than usual and "stays cold".  She just recently had an IUD inserted a few months ago.  She previously had 1 and before the birth of her daughter and had issues with spotting during the first few months of use with that as well.  Currently sexually active with one female partner with occasional condom use.  Denies abdominal pain.  Patient endorses some mild vaginal itching and would like to have STI testing performed.  PERTINENT  PMH / PSH: None  OBJECTIVE:   BP 106/72   Pulse 87   Ht 5\' 5"  (1.651 m)   Wt 182 lb 6.4 oz (82.7 kg)   LMP 08/22/2020 (Exact Date)   SpO2 99%   BMI 30.35 kg/m   General: Alert, oriented.  No acute distress CV: Regular rate and rhythm GI: Nontender to palpation GU: Scant blood seen in the vagina near the cervical os. IUD strings visualized.   ASSESSMENT/PLAN:   Vaginal bleeding 3 weeks of spotting.  Patient reports this issue with her previous IUD as well.  Endorses some symptoms of anemia.  Will get CBC.  Will start on Ortho-Cyclen to help regulate periods.  Reevaluate after 2 months.  Vulvovaginitis Patient endorses vaginal itching and would like STI testing.  Endorses being sexually active with one female partner. -Follow-up RPR, HIV, gonorrhea, chlamydia, wet prep     08/24/2020, MD [facility]

## 2020-08-26 LAB — CERVICOVAGINAL ANCILLARY ONLY
Chlamydia: NEGATIVE
Comment: NEGATIVE
Comment: NORMAL
Neisseria Gonorrhea: NEGATIVE

## 2020-08-26 LAB — LIPID PANEL
Chol/HDL Ratio: 2.8 ratio (ref 0.0–4.4)
Cholesterol, Total: 112 mg/dL (ref 100–199)
HDL: 40 mg/dL (ref >39)
LDL Chol Calc (NIH): 58 mg/dL (ref 0–99)
Triglycerides: 62 mg/dL (ref 0–149)
VLDL Cholesterol Cal: 14 mg/dL (ref 5–40)

## 2020-08-26 LAB — CBC
Hematocrit: 39.2 % (ref 34.0–46.6)
Hemoglobin: 12.7 g/dL (ref 11.1–15.9)
MCH: 28.3 pg (ref 26.6–33.0)
MCHC: 32.4 g/dL (ref 31.5–35.7)
MCV: 87 fL (ref 79–97)
Platelets: 252 10*3/uL (ref 150–450)
RBC: 4.49 x10E6/uL (ref 3.77–5.28)
RDW: 10.3 % — ABNORMAL LOW (ref 11.7–15.4)
WBC: 6 10*3/uL (ref 3.4–10.8)

## 2020-08-26 LAB — HEPATITIS C ANTIBODY: Hep C Virus Ab: 0.1 s/co ratio (ref 0.0–0.9)

## 2020-08-26 LAB — HIV ANTIBODY (ROUTINE TESTING W REFLEX): HIV Screen 4th Generation wRfx: NONREACTIVE

## 2020-08-26 LAB — RPR: RPR Ser Ql: NONREACTIVE

## 2020-08-28 DIAGNOSIS — N760 Acute vaginitis: Secondary | ICD-10-CM | POA: Insufficient documentation

## 2020-08-28 DIAGNOSIS — N939 Abnormal uterine and vaginal bleeding, unspecified: Secondary | ICD-10-CM | POA: Insufficient documentation

## 2020-08-28 NOTE — Assessment & Plan Note (Signed)
3 weeks of spotting.  Patient reports this issue with her previous IUD as well.  Endorses some symptoms of anemia.  Will get CBC.  Will start on Ortho-Cyclen to help regulate periods.  Reevaluate after 2 months.

## 2020-08-28 NOTE — Assessment & Plan Note (Signed)
Patient endorses vaginal itching and would like STI testing.  Endorses being sexually active with one female partner. -Follow-up RPR, HIV, gonorrhea, chlamydia, wet prep

## 2020-10-05 ENCOUNTER — Other Ambulatory Visit: Payer: Self-pay | Admitting: Family Medicine

## 2020-10-28 ENCOUNTER — Ambulatory Visit (INDEPENDENT_AMBULATORY_CARE_PROVIDER_SITE_OTHER): Admitting: Family Medicine

## 2020-10-28 ENCOUNTER — Other Ambulatory Visit: Payer: Self-pay

## 2020-10-28 VITALS — BP 122/84 | HR 84 | Ht 65.0 in | Wt 183.2 lb

## 2020-10-28 DIAGNOSIS — M25512 Pain in left shoulder: Secondary | ICD-10-CM | POA: Diagnosis not present

## 2020-10-28 MED ORDER — DICLOFENAC SODIUM 1 % EX GEL: 2.0000 g | Freq: Four times a day (QID) | CUTANEOUS | 0 refills | Status: DC

## 2020-10-28 NOTE — Assessment & Plan Note (Signed)
Assessment: 34 year old female with 1 day history of exquisite left shoulder pain which started yesterday afternoon.  Patient does not recall any injury or trauma to this.  She has never injured her shoulder in the past.  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  Physical exam strongly suggestive of supraspinatus etiology with pain on palpation in the region of the supraspinatus, inability to do strength testing in abduction and flexion due to pain, positive empty can test with the left shoulder, and limitations with drop arm testing due to pain though she is able to control her arm down through most of the motion.  Patient also has a negative Yergason's test.  Bedside ultrasound was performed which showed some possible cortical irregularity in the region of the supraspinatus with a possible small tear versus anisotropy when viewing the supraspinatus muscle.  We will plan to refer to sports medicine for more further eval and ultrasound. Plan: -Sent prescription for Voltaren gel -We will provide work note to keep patient out of work the next 2 days and then return to work with lifting no more than 10 pounds for 7 days -We will provide physical therapy and sports medicine referrals -Patient plans to follow-up with [place] as needed when she gets set up with sports medicine and physical therapy.

## 2020-10-28 NOTE — Progress Notes (Signed)
SUBJECTIVE:   CHIEF COMPLAINT / HPI:   Shoulder pain, Left:  Patient is a 34 year old female who presents today to discuss left shoulder pain. Started yesterday evening around 4pm. She noted it at work, she does a lot of lifting at work. Doesn't recall any pop or injury. Pain is mostly in front and lateral side of left shoulder. Had tried tylenol. No previous shoulder injury.  She works in [employer], newspaper and carries heavy trays often.  She noted that her shoulder was painful while at work yesterday but does not recall injuring it.  She states that most of the pain is when she tries to raise her arm such as putting on shirt.  PERTINENT  PMH / PSH: None  OBJECTIVE:   BP 122/84   Pulse 84   Ht 5\' 5"  (1.651 m)   Wt 183 lb 3.2 oz (83.1 kg)   LMP 10/15/2020   SpO2 99%   BMI 30.49 kg/m    General: NAD, pleasant, able to participate in exam MSK: Patient with pain on palpation on the anterior and lateral aspect of the left shoulder.  No edema present.  Patient with limited range of motion with shoulder flexion and shoulder abduction.  Patient with strength 5/5 in left shoulder internal and external rotation without pain.  Patient limited by pain with strength testing in flexion and abduction.  Patient with positive empty can testing on left shoulder.  Patient with limitations with drop arm testing on the left due to significant pain.  Negative Yergason test  Bedside ultrasound of the left shoulder performed in clinic: -Biceps tendon: Well visualized within the bicipital groove and without any abnormalities -Supraspinatus: Well-visualized and with possible mild cortical irregularity as well as some edema surrounding the supraspinatus and a possible small tear versus anisotropy. -Infraspinatus/teres minor: Insertion point on posterior humerus visualized and without abnormalities. Conclusion: Possible small tear and edema in the location of the supraspinatus.  Will refer to sports medicine for  further evaluation.  ASSESSMENT/PLAN:   Acute pain of left shoulder Assessment: 34 year old female with 1 day history of exquisite left shoulder pain which started yesterday afternoon.  Patient does not recall any injury or trauma to this.  She has never injured her shoulder in the past.  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  Physical exam strongly suggestive of supraspinatus etiology with pain on palpation in the region of the supraspinatus, inability to do strength testing in abduction and flexion due to pain, positive empty can test with the left shoulder, and limitations with drop arm testing due to pain though she is able to control her arm down through most of the motion.  Patient also has a negative Yergason's test.  Bedside ultrasound was performed which showed some possible cortical irregularity in the region of the supraspinatus with a possible small tear versus anisotropy when viewing the supraspinatus muscle.  We will plan to refer to sports medicine for more further eval and ultrasound. Plan: -Sent prescription for Voltaren gel -We will provide work note to keep patient out of work the next 2 days and then return to work with lifting no more than 10 pounds for 7 days -We will provide physical therapy and sports medicine referrals -Patient plans to follow-up with 20 as needed when she gets set up with sports medicine and physical therapy.     [place], DO [facility]    This  note was prepared using Dragon voice recognition software and may include unintentional dictation errors due to the inherent limitations of voice recognition software.

## 2020-10-28 NOTE — Patient Instructions (Signed)
It was great to see you!  Our plans for today:  -Today we discussed your left shoulder pain.  We performed an ultrasound which did look like you may have some signs of damage to one of the rotator cuff muscles called the supraspinatus muscle. -I am sending in a prescription for Voltaren gel, you can use this 3-4 times per day on the left shoulder.  You can purchase this over-the-counter as well -I am sending in a referral for physical therapy as well as a referral for sports medicine.  Each of them should contact you in the next 1 week.  If you do not hear from them please let us know. -I am given you a work note to allow you to stay out of work for the next 2 days and then to return to work but not carrying anything greater than 10 pounds for the next 1 week.  This should give you time to allow this to start to heal.  Take care and seek immediate care sooner if you develop any concerns.   Dr. [facility]

## 2020-11-04 ENCOUNTER — Ambulatory Visit (INDEPENDENT_AMBULATORY_CARE_PROVIDER_SITE_OTHER): Admitting: Family Medicine

## 2020-11-04 ENCOUNTER — Other Ambulatory Visit: Payer: Self-pay

## 2020-11-04 VITALS — BP 112/68 | Ht 65.0 in | Wt 185.0 lb

## 2020-11-04 DIAGNOSIS — M7592 Shoulder lesion, unspecified, left shoulder: Secondary | ICD-10-CM

## 2020-11-04 NOTE — Progress Notes (Signed)
Office Visit Note   Patient: Susan Crawford           Date of Birth: 1986-05-28           MRN: [id number] Visit Date: 11/04/2020 Requested by: Towanda Octave, MD [address] [place],  [place] [address] PCP: Towanda Octave, MD  Subjective: CC: Left Shoulder Pain  HPI: 33yo otherwise healthy female presenting to clinic with concerns of two weeks of left shoulder pain. Patient states that she has no idea what she did to irritate her shoulder, and cannot recall any trauma.  She does work in a kitchen, lifting heavy pots and Doctor, general practice, and feels that this may have contributed.  She states that her pain has gotten so bad she "cannot use my arm at all."  Pain hurts when laying on that side at night, as well as when reaching overhead.  She states that she has never had significant pain like this before.  She has been taking ibuprofen as recommended by her primary care provider, and this offers some relief.  No bruising that she has noticed to the area.  No numbness or weakness in the distal hand.  Pain will radiate up along her trapezius, but does not shoot down into her lower arm.              ROS:   All other systems were reviewed and are negative.  Objective: Vital Signs: BP 112/68   Ht 5\' 5"  (1.651 m)   Wt 185 lb (83.9 kg)   LMP 10/15/2020   BMI 30.79 kg/m   Physical Exam:  General:  Alert and oriented, in no acute distress. Pulm:  Breathing unlabored. Psy:  Normal mood, congruent affect. Skin: Left shoulder with no bruising, rashes, or erythema.  Overlying skin intact. Left Shoulder Exam:  Inspection: Symmetric muscle mass, no atrophy or deformity, no scars. Palpation: endorses tenderness to palpation along superior trapezius and around acromion. Tenderness over biceps tendon.   No tenderness over the [facility] joint.  Range of motion: Full range of motion in forward flexion, abduction and extension- though endorses pain with flexion/abduction beyond 90*. Apley scratch symmetrical at  approximately level of T6 vertebrae.  Rotator cuff testing:  Full strength with empty can, though does endorse significant pain with this (supraspinatus).  External rotation with full strength, though endorses mild pain. Full strength and no pain with Napoleon and lift off.  AC joint testing: No AC tenderness to palpation, negative scarf test. Negative active compression test.  Biceps testing: Negative speeds, negative Yergason.  Impingement testing: endorses pain with Hawkins   Strength testing:  5 out of 5 strength with wrist extension (C6), wrist flexion (C7), grip strength (C8), and finger abduction (T1). 4 out of 5 with shoulder abduction on left (C5) due to pain.   Sensation: Intact to light touch throughout bilateral upper extremities.   Brisk distal capillary refill.  Imaging: Extremity Ultrasound: Left shoulder Biceps visualized in long and short axis, with no surrounding fluid, fibers intact.  Infra, Subscap, and teres minor visualized with fibers intact, no significant surrounding fluid or irregularities.  Supraspinatus with internal calcifications, hyperechoity. Increased fluid signal in subacromial bursa.  AC and glenohumeral Joints without effusion or spurring.  Impression: Supraspinatus tendinopathy, subacromial bursitis.   Assessment & Plan: 34 year old female presenting to clinic with 2 weeks of atraumatic left shoulder pain.  Examination as above, with strength preserved however pain upon rotator cuff testing.  Ultrasound confirms suspicion for supraspinatus tendinopathy though  cannot officially rule out partial tear.  We will start treatment with home exercises dedicated to her rotator cuff.  Patient already has high strength ibuprofen at home. -Discussed safe NSAID use. -Educated on home exercises to perform to strengthen her rotator cuff and encourage healing. -Return to clinic in 3 to 4 weeks for reevaluation.  If no improvement at that time, would consider  formal physical therapy referral. -Patient expresses understanding, and has no further questions or concerns today.  I was the preceptor for this visit and available for immediate consultation Marsa Aris, DO

## 2020-11-04 NOTE — Patient Instructions (Signed)
Your Rotator Cuff looks inflamed on the Ultrasound we did today. This should respond very well to conservative treatment, if you're diligent about doing home exercises.  - Continue Ibuprofen 800mg  TID with food for the next week, then decrease to 400mg  as you need it for pain. - Come back to see in a month to let know how you're improving.  - If you're not feeling any better, we could consider formal Physical Therapy, or a steroid shot (if your pain is worse).

## 2021-06-05 ENCOUNTER — Other Ambulatory Visit: Payer: Self-pay

## 2021-06-05 ENCOUNTER — Encounter: Payer: Self-pay | Admitting: Family Medicine

## 2021-06-05 ENCOUNTER — Ambulatory Visit (INDEPENDENT_AMBULATORY_CARE_PROVIDER_SITE_OTHER): Payer: Medicaid Other | Admitting: Family Medicine

## 2021-06-05 VITALS — BP 125/70 | HR 85 | Ht 65.0 in | Wt 189.4 lb

## 2021-06-05 DIAGNOSIS — N920 Excessive and frequent menstruation with regular cycle: Secondary | ICD-10-CM | POA: Diagnosis not present

## 2021-06-05 DIAGNOSIS — L299 Pruritus, unspecified: Secondary | ICD-10-CM

## 2021-06-05 NOTE — Progress Notes (Addendum)
    SUBJECTIVE:   CHIEF COMPLAINT / HPI:   Susan Crawford is a 35 yo F who presents for the following:  Itching all over body Started 3 days ago after eating cherries. Has had cherries in the past and never a reaction to them. Did have scratchy throat and tingling lips. Denies any issues with breathing or shortness of breath. Continues to itching all over her body. Denies rash. Endorses she has changed laundry detergent and when using a wash cloth she felt her eye become itchy. She has used this detergent before but not in a awhile. No one else in the home is having this reaction. Denies new soaps or medicines.   Heavy bleeding Irregular periods lasting >10 days. Usually happens with birth control. Endorsing intermittent dizziness and fatigue. Have tried several forms of birth control.   PERTINENT  PMH / PSH: Asthma, seasonal allergies  OBJECTIVE:   BP 125/70   Pulse 85   Ht 5\' 5"  (1.651 m)   Wt 189 lb 6.4 oz (85.9 kg)   SpO2 97%   BMI 31.52 kg/m   General: Appears well, no acute distress. Age appropriate. Respiratory: normal effort Skin: Warm and dry, no rashes noted. Skin is unremarkable.  Neuro: alert and oriented x4 Psych: normal affect  ASSESSMENT/PLAN:   1. Itching Acute event with possible allergic component. Patient is stable. No respiratory symptoms or rash at this time. Question allergy to cherries v. Contact dermatitis due to laundry detergent changes. Will check labs below to look for pruritis due to polycythemia vera or liver disease.  - Start Benadryl or zyrtec - CBC with Differential - Comprehensive metabolic panel - Consider need for Epi-pen  2. Heavy menses due to IUD (HCC) Irregular and lasting for >10 days since IUD. Labs below to check for anemia.  - CBC with Differential - Consider IUD removal  , DO [facility]

## 2021-06-05 NOTE — Patient Instructions (Addendum)
It was wonderful to see you today.  You can take benadryl or zyrtec for itching. We will follow up on the lab work.   If you haven't already, sign up for My Chart to have easy access to your labs results, and communication with your primary care physician.  Please call the clinic at [phone] if your symptoms worsen or you have any concerns. It was our pleasure to serve you.  Dr. Salvadore Dom

## 2021-06-06 LAB — CBC WITH DIFFERENTIAL/PLATELET
Basophils Absolute: 0 10*3/uL (ref 0.0–0.2)
Basos: 0 %
EOS (ABSOLUTE): 0.1 10*3/uL (ref 0.0–0.4)
Eos: 1 %
Hematocrit: 36.6 % (ref 34.0–46.6)
Hemoglobin: 11.6 g/dL (ref 11.1–15.9)
Immature Grans (Abs): 0 10*3/uL (ref 0.0–0.1)
Immature Granulocytes: 0 %
Lymphocytes Absolute: 3.1 10*3/uL (ref 0.7–3.1)
Lymphs: 33 %
MCH: 27.8 pg (ref 26.6–33.0)
MCHC: 31.7 g/dL (ref 31.5–35.7)
MCV: 88 fL (ref 79–97)
Monocytes Absolute: 0.9 10*3/uL (ref 0.1–0.9)
Monocytes: 9 %
Neutrophils Absolute: 5.3 10*3/uL (ref 1.4–7.0)
Neutrophils: 57 %
Platelets: 286 10*3/uL (ref 150–450)
RBC: 4.18 x10E6/uL (ref 3.77–5.28)
RDW: 10.5 % — ABNORMAL LOW (ref 11.7–15.4)
WBC: 9.4 10*3/uL (ref 3.4–10.8)

## 2021-06-06 LAB — COMPREHENSIVE METABOLIC PANEL
ALT: 13 IU/L (ref 0–32)
AST: 22 IU/L (ref 0–40)
Albumin/Globulin Ratio: 1.6 (ref 1.2–2.2)
Albumin: 4.5 g/dL (ref 3.8–4.8)
Alkaline Phosphatase: 69 IU/L (ref 44–121)
BUN/Creatinine Ratio: 16 (ref 9–23)
BUN: 12 mg/dL (ref 6–20)
Bilirubin Total: 0.4 mg/dL (ref 0.0–1.2)
CO2: 21 mmol/L (ref 20–29)
Calcium: 9.7 mg/dL (ref 8.7–10.2)
Chloride: 102 mmol/L (ref 96–106)
Creatinine, Ser: 0.76 mg/dL (ref 0.57–1.00)
Globulin, Total: 2.9 g/dL (ref 1.5–4.5)
Glucose: 90 mg/dL (ref 65–99)
Potassium: 4 mmol/L (ref 3.5–5.2)
Sodium: 139 mmol/L (ref 134–144)
Total Protein: 7.4 g/dL (ref 6.0–8.5)
eGFR: 105 mL/min/{1.73_m2} (ref >59)

## 2021-06-08 ENCOUNTER — Encounter: Payer: Self-pay | Admitting: Family Medicine

## 2021-06-20 ENCOUNTER — Encounter: Payer: Self-pay | Admitting: Family Medicine

## 2021-06-20 ENCOUNTER — Other Ambulatory Visit: Payer: Self-pay

## 2021-06-20 ENCOUNTER — Ambulatory Visit (INDEPENDENT_AMBULATORY_CARE_PROVIDER_SITE_OTHER): Payer: Medicaid Other | Admitting: Family Medicine

## 2021-06-20 VITALS — BP 126/82 | HR 88 | Wt 189.2 lb

## 2021-06-20 DIAGNOSIS — N939 Abnormal uterine and vaginal bleeding, unspecified: Secondary | ICD-10-CM

## 2021-06-20 DIAGNOSIS — Z1159 Encounter for screening for other viral diseases: Secondary | ICD-10-CM | POA: Diagnosis not present

## 2021-06-20 DIAGNOSIS — N920 Excessive and frequent menstruation with regular cycle: Secondary | ICD-10-CM

## 2021-06-20 DIAGNOSIS — Z0000 Encounter for general adult medical examination without abnormal findings: Secondary | ICD-10-CM
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
## 2021-06-20 NOTE — Patient Instructions (Signed)
Thank you for coming to see me today. It was a pleasure. Today we discussed your heavy bleeding. I recommend ultrasound of the uterus.  We can remove the IUD as you may be experiencing side effects.   We will get some labs today.  If they are abnormal or we need to do something about them, I will call you.  If they are normal, I will send you a message on MyChart (if it is active) or a letter in the mail.  If you don't hear from [place] in 2 weeks, please call the office at the number below.   Please follow-up with me in 2 weeks   If you have any questions or concerns, please do not hesitate to call the office at [phone].  Best wishes,   Dr Allena Katz

## 2021-06-21 LAB — HEPATITIS B SURFACE ANTIGEN: Hepatitis B Surface Ag: NEGATIVE

## 2021-06-21 LAB — TSH: TSH: 0.851 u[IU]/mL (ref 0.450–4.500)

## 2021-06-22 DIAGNOSIS — Z0000 Encounter for general adult medical examination without abnormal findings: Secondary | ICD-10-CM | POA: Insufficient documentation

## 2021-06-22 NOTE — Assessment & Plan Note (Addendum)
Menorrahgia with regular cycles. Pt currently has mirena IUD in however has not helped with menorrhagia. She has tried OCP with mirena in the past. She has menorrhagia on all forms of birth control. Ordered TV [place] to investigate potential causes such as fibroids, adenomyosis, endometriosis etc. Also discussed removing IUD and trying depo shot which pt is happy to try. CBC and CMP normal 2 weeks ago. Ordered TSH today. Follow up with me in 1-2 weeks to remove IUD.

## 2021-06-22 NOTE — Assessment & Plan Note (Signed)
Obtained Hep B surface Ag today.

## 2021-07-07 ENCOUNTER — Ambulatory Visit: Payer: Medicaid Other | Admitting: Family Medicine

## 2021-07-27 ENCOUNTER — Other Ambulatory Visit: Payer: Self-pay

## 2021-07-27 ENCOUNTER — Ambulatory Visit (INDEPENDENT_AMBULATORY_CARE_PROVIDER_SITE_OTHER): Payer: Medicaid Other | Admitting: Family Medicine

## 2021-07-27 ENCOUNTER — Encounter: Payer: Self-pay | Admitting: Family Medicine

## 2021-07-27 DIAGNOSIS — Z30432 Encounter for removal of intrauterine contraceptive device: Secondary | ICD-10-CM | POA: Diagnosis present

## 2021-07-27 DIAGNOSIS — N939 Abnormal uterine and vaginal bleeding, unspecified: Secondary | ICD-10-CM

## 2021-07-27 NOTE — Assessment & Plan Note (Addendum)
Patient was in the dorsal lithotomy position, normal external genitalia was noted.  A speculum was placed in the patient's vagina, normal discharge was noted, no lesions. The multiparous cervix was visualized, no lesions, no abnormal discharge.  The strings of the IUD were grasped and pulled using ring forceps. Patient tolerated the procedure well.    Discussed other forms of birth control, however pt declined as she is not sexually active at the moment.

## 2021-07-27 NOTE — Progress Notes (Signed)
     SUBJECTIVE:   CHIEF COMPLAINT / HPI:   Susan Crawford is a 35 y.o. female presents for IUD removal  IUD removal  Pt desires IUD removal. Discussed options for birth control such as depo,  tubal ligation/vasectomy (does not wish to have more children). She does not want birth control right now and is not sexually active.   Menorrhagia Still reports on going menorrhagia. Her LMP was 2 weeks ago. Has not yet had her TV US as she was not called with an appointment.   Flowsheet Row Office Visit from 07/27/2021 in [facility]  PHQ-9 Total Score 0      PERTINENT  PMH / PSH: Menorrhagia, GERD  OBJECTIVE:   BP 133/85   Pulse 90   Wt 188 lb 12.8 oz (85.6 kg)   SpO2 100%   BMI 31.42 kg/m    General: Alert, no acute distress Cardio: well perfused Pulm: normal work of breathing Neuro: Cranial nerves grossly intact    Pelvic Exam chaperoned by CMA [place]        External: normal female genitalia without lesions or masses        Vagina: normal without lesions or masses        Cervix: normal without lesions or masses        IUD removed   ASSESSMENT/PLAN:   Encounter for IUD removal Patient was in the dorsal lithotomy position, normal external genitalia was noted.  A speculum was placed in the patient's vagina, normal discharge was noted, no lesions. The multiparous cervix was visualized, no lesions, no abnormal discharge.  The strings of the IUD were grasped and pulled using ring forceps. Patient tolerated the procedure well.    Discussed other forms of birth control, however pt declined as she is not sexually active at the moment.    Vaginal bleeding Follow up with me after TV US.     Towanda Octave, MD PGY-3 [facility]

## 2021-07-27 NOTE — Patient Instructions (Signed)
Thank you for coming to see me today. It was a pleasure. Today we removed your IUD. For your heavy bleeding lets do the ultrasound and see what that shows. For birth control you will like to not be on anything. If you do decide to in the future tubal ligation or depo may be good options for you.  We will call you with a date and time for the ultrasound.   Please follow-up with me after your ultrasound.  If you have any questions or concerns, please do not hesitate to call the office at [phone].  Best wishes,   Dr Allena Katz

## 2021-07-27 NOTE — Assessment & Plan Note (Signed)
Follow up with me after TV US.

## 2021-11-12 ENCOUNTER — Ambulatory Visit (HOSPITAL_COMMUNITY)
Admission: EM | Admit: 2021-11-12 | Discharge: 2021-11-12 | Disposition: A | Discharge status: Home / Self Care | Payer: Medicaid Other

## 2021-11-12 ENCOUNTER — Other Ambulatory Visit: Payer: Self-pay

## 2021-11-14 ENCOUNTER — Ambulatory Visit (INDEPENDENT_AMBULATORY_CARE_PROVIDER_SITE_OTHER): Payer: Medicaid Other | Admitting: Family Medicine

## 2021-11-14 ENCOUNTER — Other Ambulatory Visit: Payer: Self-pay

## 2021-11-14 VITALS — BP 138/96 | HR 90 | Temp 101.6°F

## 2021-11-14 DIAGNOSIS — Z1152 Encounter for screening for COVID-19: Secondary | ICD-10-CM | POA: Diagnosis not present

## 2021-11-14 DIAGNOSIS — J3489 Other specified disorders of nose and nasal sinuses: Secondary | ICD-10-CM | POA: Diagnosis not present

## 2021-11-14 DIAGNOSIS — R0989 Other specified symptoms and signs involving the circulatory and respiratory systems: Secondary | ICD-10-CM

## 2021-11-14 NOTE — Patient Instructions (Addendum)
I recommend staying home from work while you recover from this illness. I will follow up with your results from your covid and influenza test once they are available.   You can continue to take ibuprofen or tylenol to control your fever and other symptoms.   Please continue to drink plenty of fluids and rest.

## 2021-11-14 NOTE — Assessment & Plan Note (Signed)
Patient is febrile with sinus pain and productive cough  Will test for COVID and influenza, with plan to follow up results when available  Recommended plenty of fluids, tylenol and iburprofen for fever control  Patient recommended to stay out of work until 24 hours without fever medication

## 2021-11-14 NOTE — Progress Notes (Signed)
    SUBJECTIVE:   CHIEF COMPLAINT / HPI: congestion, coughing, ear and facial pain   Patient reports taking sudafed day and night for 2 days. She reports that he children were sick one week prior.her symtpoms started 4 days ago.  Appetite is normal. Her cough is worse at night. She has been trying to take robitussin for cough congestion and influenza. She reports one day of wheezing but that stopped. She reports bad HA at night and has to take 3 tylenol pills to improve symptoms.   Review of Systems  Constitutional:  Positive for fever and malaise/fatigue. Negative for chills and diaphoresis.  HENT:  Positive for congestion and sore throat. Negative for ear pain.   Respiratory:  Positive for cough and sputum production. Negative for hemoptysis.   Gastrointestinal:  Negative for abdominal pain, diarrhea and nausea.  Skin:  Negative for rash.  Neurological:  Positive for dizziness and headaches.    OBJECTIVE:   BP (!) 138/96   Pulse 90   Temp (!) 101.6 F (38.7 C) (Oral)   SpO2 96%   Physical Exam Constitutional:      General: She is not in acute distress.    Appearance: Normal appearance. She is not ill-appearing, toxic-appearing or diaphoretic.  HENT:     Head: No right periorbital erythema or left periorbital erythema.     Right Ear: There is impacted cerumen.     Left Ear: There is impacted cerumen.     Nose: Congestion present.     Right Turbinates: Enlarged.     Left Turbinates: Enlarged.     Right Sinus: No maxillary sinus tenderness or frontal sinus tenderness.     Left Sinus: No maxillary sinus tenderness or frontal sinus tenderness.     Mouth/Throat:     Mouth: Mucous membranes are moist.     Pharynx: Oropharynx is clear. Uvula midline. No oropharyngeal exudate or posterior oropharyngeal erythema.     Tonsils: No tonsillar exudate.  Eyes:     General: No scleral icterus.       Right eye: No discharge.        Left eye: No discharge.     Conjunctiva/sclera:  Conjunctivae normal.  Cardiovascular:     Rate and Rhythm: Normal rate and regular rhythm.     Heart sounds: No murmur heard. Pulmonary:     Effort: Pulmonary effort is normal.     Breath sounds: No wheezing, rhonchi or rales.  Lymphadenopathy:     Cervical: No cervical adenopathy.  Skin:    Capillary Refill: Capillary refill takes less than 2 seconds.     Findings: No rash.  Neurological:     Mental Status: She is alert.     ASSESSMENT/PLAN:   Sinus pain Patient is febrile with sinus pain and productive cough  Will test for COVID and influenza, with plan to follow up results when available  Recommended plenty of fluids, tylenol and iburprofen for fever control  Patient recommended to stay out of work until 24 hours without fever medication     Ronnald Ramp, MD [facility]

## 2021-11-16 LAB — COVID-19, FLU A+B NAA
Influenza A, NAA: NOT DETECTED
Influenza B, NAA: NOT DETECTED
SARS-CoV-2, NAA: NOT DETECTED

## 2021-11-18 DIAGNOSIS — J398 Other specified diseases of upper respiratory tract: Secondary | ICD-10-CM | POA: Insufficient documentation

## 2021-11-18 NOTE — Assessment & Plan Note (Addendum)
Will test for coronavirus and influenza given symptoms  Recommended continued symptom treatment with over the counter agents  Continue to eat and drink plenty of fluids to maintain hydration  Not yet at time frame for sinusitis so will not treat with abx at this time  Patient encouraged to return if symptoms persist or worsen

## 2021-11-20 ENCOUNTER — Other Ambulatory Visit: Payer: Self-pay

## 2021-11-20 ENCOUNTER — Ambulatory Visit (INDEPENDENT_AMBULATORY_CARE_PROVIDER_SITE_OTHER): Payer: Medicaid Other | Admitting: Family Medicine

## 2021-11-20 ENCOUNTER — Encounter: Payer: Self-pay | Admitting: Family Medicine

## 2021-11-20 ENCOUNTER — Other Ambulatory Visit (HOSPITAL_COMMUNITY)
Admission: RE | Admit: 2021-11-20 | Discharge: 2021-11-20 | Disposition: A | Discharge status: Home / Self Care | Payer: Medicaid Other | Source: Ambulatory Visit | Attending: Family Medicine | Admitting: Family Medicine

## 2021-11-20 VITALS — BP 133/91 | HR 102 | Ht 65.0 in | Wt 194.1 lb

## 2021-11-20 DIAGNOSIS — R399 Unspecified symptoms and signs involving the genitourinary system: Secondary | ICD-10-CM

## 2021-11-20 DIAGNOSIS — Z113 Encounter for screening for infections with a predominantly sexual mode of transmission: Secondary | ICD-10-CM | POA: Diagnosis not present

## 2021-11-20 DIAGNOSIS — N760 Acute vaginitis: Secondary | ICD-10-CM | POA: Diagnosis present

## 2021-11-20 DIAGNOSIS — N898 Other specified noninflammatory disorders of vagina: Secondary | ICD-10-CM

## 2021-11-20 LAB — POCT URINALYSIS DIP (MANUAL ENTRY)
Bilirubin, UA: NEGATIVE
Blood, UA: NEGATIVE
Glucose, UA: NEGATIVE mg/dL
Ketones, POC UA: NEGATIVE mg/dL
Leukocytes, UA: NEGATIVE
Nitrite, UA: NEGATIVE
Protein Ur, POC: NEGATIVE mg/dL
Spec Grav, UA: 1.025 (ref 1.010–1.025)
Urobilinogen, UA: 1 E.U./dL
pH, UA: 6.5 (ref 5.0–8.0)

## 2021-11-20 LAB — POCT WET PREP (WET MOUNT)
Clue Cells Wet Prep Whiff POC: NEGATIVE
Trichomonas Wet Prep HPF POC: ABSENT
WBC, Wet Prep HPF POC: >20

## 2021-11-20 NOTE — Progress Notes (Signed)
wet 

## 2021-11-20 NOTE — Patient Instructions (Signed)
It was wonderful to meet you today. Thank you for allowing me to be a part of your care. Below is a short summary of what we discussed at your visit today:  Vaginal Irritation  STD testing We swabbed your vagina to test for gonorrhea, chlamydia, trichomonas. For normal results, we will either send you a MyChart message or a letter. If there are any abnormal results, we will call you with the results and a plan going forward.    Please bring all of your medications to every appointment!  If you have any questions or concerns, please do not hesitate to contact us via phone or MyChart message.   Fayette Pho, MD

## 2021-11-21 DIAGNOSIS — Z113 Encounter for screening for infections with a predominantly sexual mode of transmission: Secondary | ICD-10-CM | POA: Insufficient documentation

## 2021-11-21 DIAGNOSIS — N898 Other specified noninflammatory disorders of vagina: Secondary | ICD-10-CM | POA: Insufficient documentation

## 2021-11-21 LAB — RPR: RPR Ser Ql: NONREACTIVE

## 2021-11-21 LAB — HIV ANTIBODY (ROUTINE TESTING W REFLEX): HIV Screen 4th Generation wRfx: NONREACTIVE

## 2021-11-21 NOTE — Assessment & Plan Note (Signed)
New, subacute. Physical exam concerning for trichomonas, none seen on wet prep. Wet prep negative for BV, yeast, trich. Will await send out test to return. Concurrent STI testing collected.

## 2021-11-21 NOTE — Progress Notes (Signed)
    SUBJECTIVE:   CHIEF COMPLAINT / HPI:   Vaginal odor - 2 week duration vaginal "fishy" odor - no abnormal discharge - no pain, discomfort, pruritis - no dysuria, urinary frequency, incomplete emptying - no rashes or lesions - no fever, chills, or other systemic symptoms - new sexual partner, but this started beforehand - no new laundry soaps, feminine washes, fabric softeners, etc - amenable to concurrent STI testing today - not yet due for repeat PAP, last NILM with (-)HPV on 04/07/2020  PERTINENT  PMH / PSH:  Patient Active Problem List   Diagnosis Date Noted   Vaginal odor 11/21/2021   Routine screening for STI (sexually transmitted infection) 11/21/2021   Sinus pain 11/14/2021   Encounter for IUD removal 07/27/2021   Health maintenance examination 06/22/2021   Vaginal bleeding 08/28/2020   ASTHMA, PERSISTENT 09/25/2010   Seasonal allergies 10/18/2008     OBJECTIVE:   BP (!) 133/91   Pulse (!) 102   Ht 5\' 5"  (1.651 m)   Wt 194 lb 2 oz (88.1 kg)   LMP 10/24/2021 (Approximate)   SpO2 100%   BMI 32.30 kg/m   Physical Exam General: Awake, alert, oriented, no acute distress Respiratory: Normal work of breathing, no respiratory distress Neuro: Cranial nerves II through X grossly intact, able to move all extremities spontaneously Vulva: Normal appearing vulva without rashes, lesions, or deformities Vagina: Pale pink rugated vaginal tissue without obvious lesions, physiologic discharge of whitish color, cervix centrally erythematous or overt tenderness or bleeding with swab   ASSESSMENT/PLAN:   Routine screening for STI (sexually transmitted infection) Concurrent STI screen with vaginal swab. Will collect for GC, chlamydia, HIV, RPR. Will relay results when returned.   Vaginal odor New, subacute. Physical exam concerning for trichomonas, none seen on wet prep. Wet prep negative for BV, yeast, trich. Will await send out test to return. Concurrent STI testing  collected.      13/12/2020, MD [facility]

## 2021-11-21 NOTE — Assessment & Plan Note (Signed)
Concurrent STI screen with vaginal swab. Will collect for GC, chlamydia, HIV, RPR. Will relay results when returned.

## 2021-11-22 LAB — CERVICOVAGINAL ANCILLARY ONLY
Bacterial Vaginitis (gardnerella): NEGATIVE
Candida Glabrata: NEGATIVE
Candida Vaginitis: NEGATIVE
Chlamydia: NEGATIVE
Comment: NEGATIVE
Comment: NEGATIVE
Comment: NEGATIVE
Comment: NEGATIVE
Comment: NEGATIVE
Comment: NORMAL
Neisseria Gonorrhea: NEGATIVE
Trichomonas: NEGATIVE

## 2021-12-04 ENCOUNTER — Other Ambulatory Visit: Payer: Self-pay

## 2021-12-04 ENCOUNTER — Ambulatory Visit (INDEPENDENT_AMBULATORY_CARE_PROVIDER_SITE_OTHER): Payer: Medicaid Other | Admitting: Family Medicine

## 2021-12-04 ENCOUNTER — Encounter: Payer: Self-pay | Admitting: Family Medicine

## 2021-12-04 VITALS — BP 129/83 | HR 104 | Wt 192.0 lb

## 2021-12-04 DIAGNOSIS — Z3490 Encounter for supervision of normal pregnancy, unspecified, unspecified trimester: Secondary | ICD-10-CM

## 2021-12-04 DIAGNOSIS — Z3201 Encounter for pregnancy test, result positive: Secondary | ICD-10-CM | POA: Diagnosis not present

## 2021-12-04 LAB — POCT URINE PREGNANCY: Preg Test, Ur: POSITIVE — AB

## 2021-12-04 MED ORDER — PRENATAL VITAMINS 28-0.8 MG PO TABS: 1.0000 | ORAL_TABLET | Freq: Every day | ORAL | 3 refills | Status: DC

## 2021-12-04 MED ORDER — DOXYLAMINE-PYRIDOXINE 10-10 MG PO TBEC: 1.0000 | DELAYED_RELEASE_TABLET | Freq: Three times a day (TID) | ORAL | 2 refills | Status: DC

## 2021-12-04 NOTE — Progress Notes (Signed)
    SUBJECTIVE:   CHIEF COMPLAINT / HPI: pregnancy confirmation   Patient presents with report of positive home pregnancy test. She reports last menses was in early Nov but states that menses was abnormally light at that time. She states that if pregnancy is confirmed she would like to continue with pregnancy and receive prenatal care here at [facility]. She states that she has been having similar nausea symptoms like during her previous pregnancy. She denies HA at this time.   PERTINENT  PMH / PSH:  Asthma   OBJECTIVE:   BP 129/83   Pulse (!) 104   Wt 192 lb (87.1 kg)   LMP 10/24/2021   SpO2 100%   BMI 31.95 kg/m   General: female appearing stated age in no acute distress Cardio: Normal S1 and S2, no S3 or S4. Rhythm is regular. No murmurs or rubs.  Bilateral radial pulses palpable Pulm: Clear to auscultation bilaterally, no crackles, wheezing, or diminished breath sounds. Normal respiratory effort Abdomen: Bowel sounds normal. Abdomen soft and non-tender.  Extremities: No peripheral edema. Warm/ well perfused.  Neuro: pt alert and oriented x4    ASSESSMENT/PLAN:   Positive pregnancy test Positive UPT in office today  Prescribed prenatal vitamin as well as diclegis  Patient advised to call and schedule for new OB visit, unable to collect labs today due to lab closure      Ronnald Ramp, MD [facility]

## 2021-12-04 NOTE — Patient Instructions (Signed)
Congratulations on your new pregnancy! Please schedule a new OB appointment with our office within the next 2 weeks.   I have prescribed a medication for your nausea as well as  a prenatal vitamin to start daily.   We will need to get your lab work completed at  your new OB visit.   Pregnancy After Age 36 Women who become pregnant after the age of 78 have a higher risk for certain problems during pregnancy. This is because older women may already have health problems before becoming pregnant. Older women who are healthy before pregnancy may still develop problems during pregnancy. These problems may affect the mother, the unborn baby (fetus), or both. How does this affect me? If you are over age 48 and you want to become pregnant or are pregnant, you may have a higher risk of: Not being able to get pregnant (infertility). Going into labor early (preterm labor). Needing surgical delivery of your baby (cesarean delivery, or C-section). Having complications during pregnancy, such as high blood pressure and other symptoms (preeclampsia). Having diabetes during pregnancy (gestational diabetes). Being pregnant with more than one baby. Loss of the unborn baby before 20 weeks (miscarriage) or after 20 weeks of pregnancy (stillbirth). How does this affect my baby? Babies born to women over the age of 86 have a higher risk for: Being born early (prematurity). Low birth weight, which is less than 5 lb, 8 oz (2.5 kg). Birth defects, such as Down syndrome and cleft palate. Health complications, including problems with growth and development. Prenatal care All women should see their health care provider before they try to become pregnant. This is especially important for women over the age of 25. Tell your health care provider about: Any health problems you have. Any medicines you take. Any family history of health problems or chromosome-related defects. Any problems you have had with past surgeries,  pregnancies, or deliveries. If you are over age 24 and you plan to become pregnant, start taking a daily multivitamin a month or more before you try to get pregnant. Your multivitamin should contain 400 mcg (micrograms) of folic acid. If you are over age 63 and pregnant, make sure you: Keep taking your multivitamin unless your health care provider tells you not to take it. Keep all follow-up visits. This includes prenatal visits. This is important. Talk with your health care provider about other prenatal screening tests that you may need. Prenatal tests Screening tests show whether your baby has a higher risk for birth defects than other babies. Screening tests include: More frequent ultrasound tests to look for markers that indicate a risk for birth defects. Maternal blood screening. These are blood tests that help to determine your baby's risk for birth defects. Screening tests do not show whether your baby has or does not have birth defects. They only show your baby's risk for certain birth defects. If your screening tests show that risk factors are present, you may need tests to confirm the birth defect (diagnostic testing). These tests may include: Chorionic villus sampling. For this procedure, a tissue sample is taken from the organ that forms in your uterus to nourish your baby (placenta). The sample is removed through your cervix or abdomen and tested. Amniocentesis. For this procedure, a small amount of the fluid that surrounds the baby in the uterus (amniotic fluid) is removed and tested. You will also need screenings tests for gestational diabetes in the first trimester, as well as later in pregnancy. Staying healthy during pregnancy  Staying healthy during pregnancy can help you and your baby to have a lower risk for problems during pregnancy, during delivery, or both. Talk with your health care provider for specific instructions about staying healthy during your pregnancy. General  tips Nutrition  At each meal, eat a variety of foods from each of the five food groups. These groups include: Proteins such as lean meats, poultry, fish that is low in fat, beans, eggs, and nuts. Vegetables such as leafy greens, raw and cooked vegetables, and vegetable juice. Fruits that are fresh, frozen, or canned, or 100% fruit juice. Dairy products such as low-fat yogurt, cheese, and milk. Whole grains including rice, cereal, pasta, and bread. Follow instructions from your health care provider about eating and drinking restrictions during pregnancy. Do not eat raw eggs, raw meat, or raw fish or seafood. Do not eat any fish that contains high amounts of mercury, such as swordfish or mackerel. Managing weight gain Ask your health care provider how much weight gain is healthy during pregnancy. Stay at a healthy weight. If needed, work with your health care provider to lose weight safely. Exercise regularly, as directed by your health care provider. Ask your health care provider what forms of exercise are safe for you. Follow these instructions at home: Do not use any products that contain nicotine or tobacco. These products include cigarettes, chewing tobacco, and vaping devices, such as e-cigarettes. If you need help quitting, ask your health care provider. Drink enough fluid to keep your urine pale yellow. Do not drink alcohol, use drugs, or abuse prescription medicine. Take over-the-counter and prescription medicines only as told by your health care provider. Do not use hot tubs, steam rooms, or saunas. Talk with your health care provider about your risk of exposure to harmful environmental conditions. This includes exposure to chemicals, radiation, cleaning products, and cat feces. Follow advice from your health care provider about how to limit your exposure. Summary Women who become pregnant after the age of 6 have a higher risk for complications during pregnancy. Problems may affect  the mother, the unborn baby (fetus), or both. All women should see their health care provider before they try to become pregnant. This is especially important for women over the age of 62. Staying healthy during pregnancy can help both you and your baby to have a lower risk for some of the problems that can happen during pregnancy, during delivery, or both. This information is not intended to replace advice given to you by your health care provider. Make sure you discuss any questions you have with your health care provider. Document Revised: 08/29/2020 Document Reviewed: 08/29/2020 Elsevier Patient Education  2022 [employer].

## 2021-12-06 ENCOUNTER — Inpatient Hospital Stay (HOSPITAL_COMMUNITY): Payer: Medicaid Other

## 2021-12-06 ENCOUNTER — Other Ambulatory Visit: Payer: Self-pay

## 2021-12-06 ENCOUNTER — Telehealth: Payer: Self-pay

## 2021-12-06 ENCOUNTER — Inpatient Hospital Stay (HOSPITAL_COMMUNITY)
Admission: AD | Admit: 2021-12-06 | Discharge: 2021-12-06 | Disposition: A | Discharge status: Home / Self Care | Payer: Medicaid Other | Attending: Obstetrics & Gynecology | Admitting: Obstetrics & Gynecology

## 2021-12-06 ENCOUNTER — Encounter (HOSPITAL_COMMUNITY): Payer: Self-pay | Admitting: Obstetrics & Gynecology

## 2021-12-06 DIAGNOSIS — O209 Hemorrhage in early pregnancy, unspecified: Secondary | ICD-10-CM | POA: Diagnosis present

## 2021-12-06 DIAGNOSIS — Z3A01 Less than 8 weeks gestation of pregnancy: Secondary | ICD-10-CM | POA: Insufficient documentation

## 2021-12-06 DIAGNOSIS — O10919 Unspecified pre-existing hypertension complicating pregnancy, unspecified trimester: Secondary | ICD-10-CM

## 2021-12-06 LAB — COMPREHENSIVE METABOLIC PANEL
ALT: 12 U/L (ref 0–44)
AST: 15 U/L (ref 15–41)
Albumin: 3.5 g/dL (ref 3.5–5.0)
Alkaline Phosphatase: 45 U/L (ref 38–126)
Anion gap: 9 (ref 5–15)
BUN: 11 mg/dL (ref 6–20)
CO2: 25 mmol/L (ref 22–32)
Calcium: 9.4 mg/dL (ref 8.9–10.3)
Chloride: 101 mmol/L (ref 98–111)
Creatinine, Ser: 0.7 mg/dL (ref 0.44–1.00)
GFR, Estimated: >60 mL/min (ref >60)
Glucose, Bld: 100 mg/dL — ABNORMAL HIGH (ref 70–99)
Potassium: 3.5 mmol/L (ref 3.5–5.1)
Sodium: 135 mmol/L (ref 135–145)
Total Bilirubin: 0.3 mg/dL (ref 0.3–1.2)
Total Protein: 7.3 g/dL (ref 6.5–8.1)

## 2021-12-06 LAB — URINALYSIS, MICROSCOPIC (REFLEX)

## 2021-12-06 LAB — URINALYSIS, ROUTINE W REFLEX MICROSCOPIC
Bilirubin Urine: NEGATIVE
Glucose, UA: NEGATIVE mg/dL
Ketones, ur: NEGATIVE mg/dL
Leukocytes,Ua: NEGATIVE
Nitrite: NEGATIVE
Protein, ur: NEGATIVE mg/dL
Specific Gravity, Urine: >1.03 — ABNORMAL HIGH (ref 1.005–1.030)
pH: 6 (ref 5.0–8.0)

## 2021-12-06 LAB — CBC WITH DIFFERENTIAL/PLATELET
Abs Immature Granulocytes: 0.01 10*3/uL (ref 0.00–0.07)
Basophils Absolute: 0 10*3/uL (ref 0.0–0.1)
Basophils Relative: 0 %
Eosinophils Absolute: 0.1 10*3/uL (ref 0.0–0.5)
Eosinophils Relative: 2 %
HCT: 34.5 % — ABNORMAL LOW (ref 36.0–46.0)
Hemoglobin: 10.8 g/dL — ABNORMAL LOW (ref 12.0–15.0)
Immature Granulocytes: 0 %
Lymphocytes Relative: 40 %
Lymphs Abs: 2.2 10*3/uL (ref 0.7–4.0)
MCH: 28.1 pg (ref 26.0–34.0)
MCHC: 31.3 g/dL (ref 30.0–36.0)
MCV: 89.8 fL (ref 80.0–100.0)
Monocytes Absolute: 0.5 10*3/uL (ref 0.1–1.0)
Monocytes Relative: 10 %
Neutro Abs: 2.6 10*3/uL (ref 1.7–7.7)
Neutrophils Relative %: 48 %
Platelets: 257 10*3/uL (ref 150–400)
RBC: 3.84 MIL/uL — ABNORMAL LOW (ref 3.87–5.11)
RDW: 11.8 % (ref 11.5–15.5)
WBC: 5.5 10*3/uL (ref 4.0–10.5)
nRBC: 0 % (ref 0.0–0.2)

## 2021-12-06 LAB — WET PREP, GENITAL
Clue Cells Wet Prep HPF POC: NONE SEEN
Sperm: NONE SEEN
Trich, Wet Prep: NONE SEEN
WBC, Wet Prep HPF POC: >=10 — AB (ref <10)
Yeast Wet Prep HPF POC: NONE SEEN

## 2021-12-06 LAB — ABO/RH: ABO/RH(D): O POS

## 2021-12-06 LAB — HCG, QUANTITATIVE, PREGNANCY: hCG, Beta Chain, Quant, S: 33452 m[IU]/mL — ABNORMAL HIGH (ref <5)

## 2021-12-06 NOTE — Telephone Encounter (Signed)
Patient calls nurse line regarding vaginal bleeding and abdominal cramping. Patient reports taking recent positive pregnancy test. LMP 10/24/21. Patient has not yet had ultrasound. Patient reports that she is saturating through panty liners and notices blood in the toilet with urination.   Advised that patient be evaluated in MAU. Patient verbalizes understanding.   Veronda Prude, RN

## 2021-12-06 NOTE — Telephone Encounter (Signed)
Agree with MAU assessment.  Terisa Starr, MD  Family Medicine Teaching Service

## 2021-12-06 NOTE — MAU Provider Note (Signed)
History     CSN: [id number]  Arrival date and time: 12/06/21 1455   Event Date/Time   First Provider Initiated Contact with Patient 12/06/21 1522      Chief Complaint  Patient presents with   Vaginal Bleeding   HPI  [name] is a 35 y.o. female [phone] @ [redacted]w[redacted]d  here in MAU with complaints of vaginal spotting. The symptoms started yesterday. She reports a light amount and then heavier. It waxes and wanes. She reports some accompanied cramping.  The cramping is in her lower abdomen. She currently rates her pain 5/10.   OB History     Gravida  5   Para  3   Term  3   Preterm  0   AB  1   Living  3      SAB  1   IAB      Ectopic      Multiple      Live Births  3           Past Medical History:  Diagnosis Date   Asthma    Preterm labor     Past Surgical History:  Procedure Laterality Date   BREAST LUMPECTOMY      Family History  Problem Relation Age of Onset   Asthma Mother    Hypertension Mother    Hypertension Maternal Grandmother    Asthma Maternal Grandmother    Other Neg Hx     Social History   Tobacco Use   Smoking status: Never   Smokeless tobacco: Never  Vaping Use   Vaping Use: Never used  Substance Use Topics   Alcohol use: No    Comment: rare   Drug use: No    Allergies: No Known Allergies  Medications Prior to Admission  Medication Sig Dispense Refill Last Dose   acetaminophen (TYLENOL) 500 MG tablet Take 2 tablets (1,000 mg total) by mouth every 8 (eight) hours as needed for moderate pain. 30 tablet 0 12/05/2021   Doxylamine-Pyridoxine (DICLEGIS) 10-10 MG TBEC Take 1 tablet by mouth every 8 (eight) hours. 90 tablet 2 12/05/2021   ibuprofen (ADVIL) 600 MG tablet Take 1 tablet (600 mg total) by mouth every 6 (six) hours as needed. 30 tablet 0 Past Week   Prenatal Vit-Fe Fumarate-FA (PRENATAL VITAMINS) 28-0.8 MG TABS Take 1 tablet by mouth daily. 90 tablet 3 12/05/2021   clindamycin (CLEOCIN) 2 % vaginal cream  Place 1 Applicatorful vaginally at bedtime. 40 g 0    diclofenac Sodium (VOLTAREN) 1 % GEL Apply 2 g topically 4 (four) times daily. 100 g 0    fluconazole (DIFLUCAN) 150 MG tablet Take 1 tablet ( ) once, if symptoms persist can take a second tablet in three days 2 tablet 0    fluticasone (FLONASE) 50 MCG/ACT nasal spray Place 2 sprays into both nostrils daily. 16 g 6    levocetirizine (XYZAL) 5 MG tablet Take 1 tablet (5 mg total) by mouth every evening. 30 tablet 0 More than a month   metroNIDAZOLE (FLAGYL) 500 MG tablet Take 1 tablet (500 mg total) by mouth 2 (two) times daily. 14 tablet 0    MILI 0.25-35 MG-MCG tablet TAKE 1 TABLET BY MOUTH DAILY 28 tablet 1    Results for orders placed or performed during the hospital encounter of 12/06/21 (from the past 48 hour(s))  Urinalysis, Routine w reflex microscopic Urine, Clean Catch     Status: Abnormal   Collection Time: 12/06/21  2:58 PM  Result Value Ref Range   Color, Urine YELLOW YELLOW   APPearance CLEAR CLEAR   Specific Gravity, Urine >1.030 (H) 1.005 - 1.030   pH 6.0 5.0 - 8.0   Glucose, UA NEGATIVE NEGATIVE mg/dL   Hgb urine dipstick TRACE (A) NEGATIVE   Bilirubin Urine NEGATIVE NEGATIVE   Ketones, ur NEGATIVE NEGATIVE mg/dL   Protein, ur NEGATIVE NEGATIVE mg/dL   Nitrite NEGATIVE NEGATIVE   Leukocytes,Ua NEGATIVE NEGATIVE    Comment: Performed at [facility], [address], [place], [place] [address]  Urinalysis, Microscopic (reflex)     Status: Abnormal   Collection Time: 12/06/21  2:58 PM  Result Value Ref Range   RBC / HPF 0-5 0 - 5 RBC/hpf   WBC, UA 0-5 0 - 5 WBC/hpf   Bacteria, UA RARE (A) NONE SEEN   Squamous Epithelial / LPF 0-5 0 - 5   Mucus PRESENT     Comment: Performed at [facility], [address], [place], [place] [address]  CBC with Differential/Platelet     Status: Abnormal   Collection Time: 12/06/21  3:45 PM  Result Value Ref Range   WBC 5.5 4.0 - 10.5 K/uL   RBC 3.84 (L) 3.87 - 5.11  MIL/uL   Hemoglobin 10.8 (L) 12.0 - 15.0 g/dL   HCT 34.1 (L) 93.7 - 90.2 %   MCV 89.8 80.0 - 100.0 fL   MCH 28.1 26.0 - 34.0 pg   MCHC 31.3 30.0 - 36.0 g/dL   RDW 40.9 73.5 - 32.9 %   Platelets 257 150 - 400 K/uL   nRBC 0.0 0.0 - 0.2 %   Neutrophils Relative % 48 %   Neutro Abs 2.6 1.7 - 7.7 K/uL   Lymphocytes Relative 40 %   Lymphs Abs 2.2 0.7 - 4.0 K/uL   Monocytes Relative 10 %   Monocytes Absolute 0.5 0.1 - 1.0 K/uL   Eosinophils Relative 2 %   Eosinophils Absolute 0.1 0.0 - 0.5 K/uL   Basophils Relative 0 %   Basophils Absolute 0.0 0.0 - 0.1 K/uL   Immature Granulocytes 0 %   Abs Immature Granulocytes 0.01 0.00 - 0.07 K/uL    Comment: Performed at [facility], [address], [place], [place] [address]  Comprehensive metabolic panel     Status: Abnormal   Collection Time: 12/06/21  3:45 PM  Result Value Ref Range   Sodium 135 135 - 145 mmol/L   Potassium 3.5 3.5 - 5.1 mmol/L   Chloride 101 98 - 111 mmol/L   CO2 25 22 - 32 mmol/L   Glucose, Bld 100 (H) 70 - 99 mg/dL    Comment: Glucose reference range applies only to samples taken after fasting for at least 8 hours.   BUN 11 6 - 20 mg/dL   Creatinine, Ser 8.34 0.44 - 1.00 mg/dL   Calcium 9.4 8.9 - 19.6 mg/dL   Total Protein 7.3 6.5 - 8.1 g/dL   Albumin 3.5 3.5 - 5.0 g/dL   AST 15 15 - 41 U/L   ALT 12 0 - 44 U/L   Alkaline Phosphatase 45 38 - 126 U/L   Total Bilirubin 0.3 0.3 - 1.2 mg/dL   GFR, Estimated >22 >29 mL/min    Comment: (NOTE) Calculated using the CKD-EPI Creatinine Equation (2021)    Anion gap 9 5 - 15    Comment: Performed at [facility], [address], [place], [place] [address]  ABO/Rh  Status: None   Collection Time: 12/06/21  3:45 PM  Result Value Ref Range   ABO/RH(D) O POS    No rh immune globuloin      NOT A RH IMMUNE GLOBULIN CANDIDATE, PT RH POSITIVE Performed at [facility], [address], [place], [place] [address]   hCG, quantitative, pregnancy     Status:  Abnormal   Collection Time: 12/06/21  3:45 PM  Result Value Ref Range   hCG, Beta Chain, Quant, S 33,452 (H) <5 mIU/mL    Comment:          GEST. AGE      CONC.  (mIU/mL)   <=1 WEEK        5 - 50     2 WEEKS       50 - 500     3 WEEKS       100 - 10,000     4 WEEKS     1,000 - 30,000     5 WEEKS     3,500 - 115,000   6-8 WEEKS     12,000 - 270,000    12 WEEKS     15,000 - 220,000        FEMALE AND NON-PREGNANT FEMALE:     LESS THAN 5 mIU/mL Performed at [facility], [address] [place], [place] [address]   Wet prep, genital     Status: Abnormal   Collection Time: 12/06/21  7:43 PM  Result Value Ref Range   Yeast Wet Prep HPF POC NONE SEEN NONE SEEN   Trich, Wet Prep NONE SEEN NONE SEEN   Clue Cells Wet Prep HPF POC NONE SEEN NONE SEEN   WBC, Wet Prep HPF POC >=10 (A) <10   Sperm NONE SEEN     Comment: Performed at [facility], [address], [place], [place] [address]    US OB LESS THAN 14 WEEKS WITH OB TRANSVAGINAL  Result Date: 12/06/2021 CLINICAL DATA:  Spotting EXAM: OBSTETRIC <14 WK [place] AND TRANSVAGINAL OB US TECHNIQUE: Both transabdominal and transvaginal ultrasound examinations were performed for complete evaluation of the gestation as well as the maternal uterus, adnexal regions, and pelvic cul-de-sac. Transvaginal technique was performed to assess early pregnancy. COMPARISON:  None. FINDINGS: Intrauterine gestational sac: Single Yolk sac:  Visualized. Embryo:  Visualized. Cardiac Activity: Visualized. Heart Rate: 121 bpm CRL:  5.3 mm   6 w   1 d                  [place] EDC: 07/31/2022 Subchorionic hemorrhage:  None visualized. Maternal uterus/adnexae: Normal IMPRESSION: Single live intrauterine gestation with approximate gestational age of [redacted] weeks and 1 day, EDC based on today's sonogram is 07/31/2022. Electronically Signed   By: Larose Hires D.O.   On: 12/06/2021 18:13     Review of Systems  Constitutional:  Negative for fever.  Gastrointestinal:  Positive for  abdominal pain.  Genitourinary:  Positive for vaginal bleeding. Negative for vaginal discharge.  Physical Exam   Blood pressure 140/78, pulse 93, temperature 98.4 F (36.9 C), resp. rate 20, last menstrual period 10/24/2021, SpO2 100 %.  Physical Exam Vitals and nursing note reviewed.  Constitutional:      General: She is not in acute distress.    Appearance: Normal appearance. She is not ill-appearing, toxic-appearing or diaphoretic.  HENT:     Head: Normocephalic.  Pulmonary:     Effort: Pulmonary effort is normal.  Genitourinary:    Comments: Wet prep and GC collected without speculum Small amount of pink discharge noted on swab.  Musculoskeletal:        General: Normal range of motion.  Skin:    General: Skin is warm.  Neurological:     Mental Status: She is alert and oriented to person, place, and time.  Psychiatric:        Behavior: Behavior normal.   MAU Course  Procedures None  MDM  O positive blood type  Wet prep & GC HIV, CBC, Hcg, ABO US OB transvaginal    Assessment and Plan   A:  1. [redacted] weeks gestation of pregnancy   2. Vaginal bleeding affecting early pregnancy      P:  Discharge home in stable condition Return to MAU if symptoms worsen Bleeding precautions Pelvic rest Return to MAU If symptoms worsen  Start prenatal care Prenatal vitamins daily  Marlon Vonruden, Harolyn Rutherford, NP 12/06/2021 8:26 PM

## 2021-12-06 NOTE — MAU Note (Signed)
Pt reports pink in her underwear last night. This morning she wiped and saw red blood. Pt reports that later on in the day she saw blood in her urine.

## 2021-12-07 LAB — GC/CHLAMYDIA PROBE AMP (~~LOC~~) NOT AT ARMC
Chlamydia: NEGATIVE
Comment: NEGATIVE
Comment: NORMAL
Neisseria Gonorrhea: NEGATIVE

## 2021-12-09 DIAGNOSIS — Z3201 Encounter for pregnancy test, result positive: Secondary | ICD-10-CM | POA: Insufficient documentation

## 2021-12-09 NOTE — Assessment & Plan Note (Signed)
Positive UPT in office today  Prescribed prenatal vitamin as well as diclegis  Patient advised to call and schedule for new OB visit, unable to collect labs today due to lab closure

## 2021-12-11 NOTE — Progress Notes (Signed)
Patient Name: Susan Crawford Date of Birth: 11-09-1986 [facility] Initial Prenatal Visit  REX MAGEE is a 35 y.o. year old [id number] at [redacted]w[redacted]d who presents for her initial prenatal visit. Pregnancy is not planned She reports morning sickness, nausea, and vomiting . She has not been able to keep down prenatal vitamin.  She denies pelvic pain or vaginal bleeding.   Pregnancy Dating: The patient is dated by LMP.  LMP: 11/01 Period is certain:  Yes.  Periods were regular:  Yes.  LMP was a typical period:  Yes.  Using hormonal contraception in 3 months prior to conception: No  Lab Review:  Not completed prior to visit  PMH: Reviewed and as detailed below: HTN: No  Type 1 or 2 Diabetes: No  Depression:  No  Seizure disorder:  No VTE: No ,  History of STI No,  Abnormal Pap smear:  No, Genital herpes simplex:  No   PSH: Gynecologic Surgery:  no Surgical history reviewed, notable for: Lumpectomy Left breast at 49yrs  Obstetric History: Obstetric history tab updated and reviewed.  Summary of prior pregnancies: [id number] Cesarean delivery: No  Gestational Diabetes:  No Hypertension in pregnancy: No History of preterm birth: No History of LGA/SGA infant:  No History of shoulder dystocia: No Indications for referral were reviewed, and the patient has no obstetric indications for referral to High Risk OB Clinic at this time.   Social History: Partner's name: Susan Crawford  Tobacco use: No Alcohol use:  No Other substance use:  No  Current Medications:  Prenatal Vitamins, Diclegis  Reviewed and appropriate in pregnancy.   Genetic and Infection Screen: Flow Sheet Updated Yes  Prenatal Exam: Gen: Well nourished, well developed.  No distress.  Vitals noted. HEENT: Normocephalic, atraumatic.  Neck supple without cervical lymphadenopathy, thyromegaly or thyroid nodules.  Fair dentition. CV: RRR no murmur, gallops or rubs Lungs: CTA B.  Normal respiratory  effort without wheezes or rales. Abd: soft, NTND. +BS.  Uterus not appreciated above pelvis. GU: Normal external female genitalia without lesions.  Nl vaginal, well rugated without lesions. No vaginal discharge.  Bimanual exam: No adnexal mass or TTP. No CMT.  Uterus size n/a Ext: No clubbing, cyanosis or edema. Psych: Normal grooming and dress.  Not depressed or anxious appearing.  Normal thought content and process without flight of ideas or looseness of associations  Fetal heart tones: Not detected and discussed with preceptor, ultrasound obtained   Assessment/Plan:  Susan Crawford is a 35 y.o. [id number] at [redacted]w[redacted]d who presents to initiate prenatal care. She is doing well.  Current pregnancy issues include.  Routine prenatal care: As dating is reliable, a dating ultrasound has not been ordered. Dating tab updated. U/S had been ordered in MAU 12/14 showing single intrauterine viable pregnancy.CRL 5.3 mm consistent with 6w 1d.  Susan Crawford 07/31/2022 Pre-pregnancy weight updated. Expected weight gain this pregnancy is 15-25 pounds Prenatal labs will be ordered today. Indications for referral to [facility] were reviewed and the patient does not meet criteria for referral.  Medication list reviewed and updated.  Recommended patient see a dentist for regular care.  Bleeding and pain precautions reviewed. Importance of prenatal vitamins reviewed.  Genetic screening offered. Patient opted for: patient undecided, will address at future visit. The patient will be age 25 or over at time of delivery. Referral to genetic counseling was not offered today.  The patient has the following risk factors for preexisting diabetes: BMI > 25 and high risk ethnicity (  Latino, African American, Native American, [place] Islander, Asian Naval architect) . An early 1 hour glucose tolerance test was not ordered as patient not able to tolerate today due to nausea. Pregnancy Medical Home and PHQ-9 forms completed, problems noted: Yes  2.  Pregnancy issues include the following which were addressed today:  Hyperemesis gravidarum: Phenergan 12.5 mg suppositories q6h prn Decreased weight from prepregnancy.  Follow up next week for weight check Documentation of chronic HTN affecting pregnancy.  Patient reports was not diagnosed with this and never on medications.  Had increased BP during labor. BP today 131/76.  Will repeat BP net week with weight check. If >130/90 will likely need referral to HR[facility]. Pr:Cr urine today OB labs today   Follow up in RN clinic in 2 weeks for weight and BP check Follow up 4 weeks for next prenatal visit.   Next visit 1hr glucola Follow up Genetic screening and referral Follow up BP/weight and labs Recommend ASA low dose at 12 weeks Follow Flu/COVID vaccine

## 2021-12-12 ENCOUNTER — Ambulatory Visit (INDEPENDENT_AMBULATORY_CARE_PROVIDER_SITE_OTHER): Payer: Medicaid Other | Admitting: Family Medicine

## 2021-12-12 ENCOUNTER — Other Ambulatory Visit: Payer: Self-pay

## 2021-12-12 VITALS — BP 131/76 | HR 86 | Wt 187.8 lb

## 2021-12-12 DIAGNOSIS — O1211 Gestational proteinuria, first trimester: Secondary | ICD-10-CM

## 2021-12-12 DIAGNOSIS — O210 Mild hyperemesis gravidarum: Secondary | ICD-10-CM | POA: Diagnosis not present

## 2021-12-12 DIAGNOSIS — Z3A01 Less than 8 weeks gestation of pregnancy: Secondary | ICD-10-CM | POA: Diagnosis not present

## 2021-12-12 DIAGNOSIS — Z3490 Encounter for supervision of normal pregnancy, unspecified, unspecified trimester: Secondary | ICD-10-CM | POA: Insufficient documentation

## 2021-12-12 DIAGNOSIS — Z3401 Encounter for supervision of normal first pregnancy, first trimester: Secondary | ICD-10-CM | POA: Diagnosis present

## 2021-12-12 LAB — POCT URINALYSIS DIP (CLINITEK)
Bilirubin, UA: NEGATIVE
Blood, UA: NEGATIVE
Glucose, UA: NEGATIVE mg/dL
Nitrite, UA: NEGATIVE
POC PROTEIN,UA: 30 — AB
Spec Grav, UA: >=1.03 — AB (ref 1.010–1.025)
Urobilinogen, UA: 0.2 E.U./dL
pH, UA: 6 (ref 5.0–8.0)

## 2021-12-12 LAB — POCT UA - MICROSCOPIC ONLY

## 2021-12-12 MED ORDER — PROMETHAZINE HCL 12.5 MG RE SUPP: 12.5000 mg | Freq: Four times a day (QID) | RECTAL | 1 refills | Status: DC | PRN

## 2021-12-12 NOTE — Patient Instructions (Signed)
Thank you for coming to see me today. It was a pleasure.   Take Phenergan suppository every 6 hours as needed for nausea and vomiting.  Return to RN clinic on Jan 3 at 9 am fo blood pressure and weight check.  We will get some labs today.  If they are abnormal or we need to do something about them, I will call you.  If they are normal, I will send you a message on MyChart (if it is active) or a letter in the mail.  If you don't hear from [place] in 2 weeks, please call the office at the number below.   If you have any vaginal bleeding, abdominal pain or cramping please go to MAU.    For any pregnancy-related emergencies after 5:30 pm please go to the Maternity Admissions Unit in the [facility] at [facility]. You will use hospital Entrance C.     Please follow-up in 4 weeks for next OB visit  If you have any questions or concerns, please do not hesitate to call the office at [phone].  Best,   Dana Allan, MD    Hyperemesis Gravidarum Hyperemesis gravidarum is a severe form of nausea and vomiting that happens during pregnancy. Hyperemesis is worse than morning sickness. It may cause you to have nausea or vomiting all day for many days. It may keep you from eating and drinking enough food and liquids, which can lead to dehydration, malnutrition, and weight loss. Hyperemesis usually occurs during the first half (the first 20 weeks) of pregnancy. It often goes away once a woman is in her second half of pregnancy. However, sometimes hyperemesis continues through an entire pregnancy. What are the causes? The cause of this condition is not known. It may be associated with: Changes in hormones in the body during pregnancy. Changes in the gastrointestinal system. Genetic or inherited conditions. What are the signs or symptoms? Symptoms of this condition include: Severe nausea and vomiting that does not go away. Problems keeping food down. Weight loss. Loss of body  fluid (dehydration). Loss of appetite. You may have no desire to eat or you may not like the food you have previously enjoyed. How is this diagnosed? This condition may be diagnosed based on your medical history, your symptoms, and a physical exam. You may also have other tests, including: Blood tests. Urine tests. Blood pressure tests. Ultrasound to look for problems with the placenta or to check if you are pregnant with more than one baby. How is this treated? This condition is managed by controlling symptoms. This may include: Following an eating plan. This can help to lessen nausea and vomiting. Treatments that do not use medicine. These include acupressure bracelets, hypnosis, and eating or drinking foods or fluids that contain ginger, ginger ale, or ginger tea. Taking prescription medicine or over-the-counter medicine as told by your health care provider. Continuing to take prenatal vitamins. You may need to change what kind you take and when you take them. Follow your health care provider's instructions about prenatal vitamins. An eating plan and medicines are often used together to help control symptoms. If medicines do not help relieve nausea and vomiting, you may need to receive fluids through an IV at the hospital. Follow these instructions at home: To help relieve your symptoms, listen to your body. Everyone is different and has different preferences. Find what works best for you. Here are some things you can try to help relieve your symptoms: Meals and  snacks  Eat 5-6 small meals daily instead of 3 large meals. Eating small meals and snacks can help you avoid an empty stomach. Before getting out of bed, eat a couple of crackers to avoid moving around on an empty stomach. Eat a protein-rich snack before bed. Examples include cheese and crackers, or a peanut butter sandwich made with 1 slice of whole-wheat bread and 1 tsp (5 g) of peanut butter. Eat and drink slowly. Try eating  starchy foods as these are usually tolerated well. Examples include cereal, toast, bread, potatoes, pasta, rice, and pretzels. Eat at least one serving of protein with your meals and snacks. Protein options include lean meats, poultry, seafood, beans, nuts, nut butters, eggs, cheese, and yogurt. Eat or suck on things that have ginger in them. It may help to relieve nausea. Add  tsp (0.44 g) ground ginger to hot tea, or choose ginger tea. Fluids It is important to stay hydrated. Try to: Drink small amounts of fluids often. Drink fluids 30 minutes before or after a meal to help lessen the feeling of a full stomach. Drink 100% fruit juice or an electrolyte drink. An electrolyte drink contains sodium, potassium, and chloride. Drink fluids that are cold, clear, and carbonated or sour. These include lemonade, ginger ale, lemon-lime soda, ice water, and sparkling water. Things to avoid Avoid the following: Eating foods that trigger your symptoms. These may include spicy foods, coffee, high-fat foods, very sweet foods, and acidic foods. Drinking more than 1 cup of fluid at a time. Skipping meals. Nausea can be more intense on an empty stomach. If you cannot tolerate food, do not force it. Try sucking on ice chips or other frozen items and make up for missed calories later. Lying down within 2 hours after eating. Being exposed to environmental triggers. These may include food smells, smoky rooms, closed spaces, rooms with strong smells, warm or humid places, overly loud and noisy rooms, and rooms with motion or flickering lights. Try eating meals in a well-ventilated area that is free of strong smells. Making quick and sudden changes in your movement. Taking iron pills and multivitamins that contain iron. If you take prescription iron pills, do not stop taking them unless your health care provider approves. Preparing food. The smell of food can spoil your appetite or trigger nausea. General  instructions Brush your teeth or use a mouth rinse after meals. Take over-the-counter and prescription medicines only as told by your health care provider. Follow instructions from your health care provider about eating or drinking restrictions. Talk with your health care provider about starting a supplement of vitamin B6. Continue to take your prenatal vitamins as told by your health care provider. If you are having trouble taking your prenatal vitamins, talk with your health care provider about other options. Keep all follow-up visits. This is important. Follow-up visits include prenatal visits. Contact a health care provider if: You have pain in your abdomen. You have a severe headache. You have vision problems. You are losing weight. You feel weak or dizzy. You cannot eat or drink without vomiting, especially if this goes on for a full day. Get help right away if: You cannot drink fluids without vomiting. You vomit blood. You have constant nausea and vomiting. You are very weak. You faint. You have a fever and your symptoms suddenly get worse. Summary Hyperemesis gravidarum is a severe form of nausea and vomiting that happens during pregnancy. Making some changes to your eating habits may help relieve nausea  and vomiting. This condition may be managed with lifestyle changes and medicines as prescribed by your health care provider. If medicines do not help relieve nausea and vomiting, you may need to receive fluids through an IV at the hospital. This information is not intended to replace advice given to you by your health care provider. Make sure you discuss any questions you have with your health care provider. Document Revised: 07/04/2020 Document Reviewed: 07/04/2020 Elsevier Patient Education  2022 [employer].

## 2021-12-13 LAB — PROTEIN / CREATININE RATIO, URINE
Creatinine, Urine: 335.4 mg/dL
Protein, Ur: 21.7 mg/dL
Protein/Creat Ratio: 65 mg/g creat (ref 0–200)

## 2021-12-14 LAB — OBSTETRIC PANEL, INCLUDING HIV
Antibody Screen: NEGATIVE
Basophils Absolute: 0 10*3/uL (ref 0.0–0.2)
Basos: 0 %
EOS (ABSOLUTE): 0 10*3/uL (ref 0.0–0.4)
Eos: 0 %
HIV Screen 4th Generation wRfx: NONREACTIVE
Hematocrit: 33.1 % — ABNORMAL LOW (ref 34.0–46.6)
Hemoglobin: 11.1 g/dL (ref 11.1–15.9)
Hepatitis B Surface Ag: NEGATIVE
Immature Grans (Abs): 0 10*3/uL (ref 0.0–0.1)
Immature Granulocytes: 0 %
Lymphocytes Absolute: 1.8 10*3/uL (ref 0.7–3.1)
Lymphs: 23 %
MCH: 28 pg (ref 26.6–33.0)
MCHC: 33.5 g/dL (ref 31.5–35.7)
MCV: 84 fL (ref 79–97)
Monocytes Absolute: 0.9 10*3/uL (ref 0.1–0.9)
Monocytes: 11 %
Neutrophils Absolute: 5.2 10*3/uL (ref 1.4–7.0)
Neutrophils: 66 %
Platelets: 304 10*3/uL (ref 150–450)
RBC: 3.96 x10E6/uL (ref 3.77–5.28)
RDW: 10.4 % — ABNORMAL LOW (ref 11.7–15.4)
RPR Ser Ql: NONREACTIVE
Rh Factor: POSITIVE
Rubella Antibodies, IGG: 1.81 index (ref >0.99)
WBC: 8 10*3/uL (ref 3.4–10.8)

## 2021-12-14 LAB — VARICELLA ZOSTER ANTIBODY, IGG: Varicella zoster IgG: 2457 index (ref >165)

## 2021-12-14 LAB — HGB FRACTIONATION CASCADE
Hgb A2: 2.4 % (ref 1.8–3.2)
Hgb A: 97.6 % (ref 96.4–98.8)
Hgb F: 0 % (ref 0.0–2.0)
Hgb S: 0 %

## 2021-12-14 LAB — HCV INTERPRETATION

## 2021-12-14 LAB — CULTURE, OB URINE

## 2021-12-14 LAB — HCV AB W REFLEX TO QUANT PCR: HCV Ab: 0.2 s/co ratio (ref 0.0–0.9)

## 2021-12-14 LAB — URINE CULTURE, OB REFLEX

## 2021-12-19 ENCOUNTER — Other Ambulatory Visit: Payer: Self-pay | Admitting: Family Medicine

## 2021-12-19 DIAGNOSIS — O210 Mild hyperemesis gravidarum: Secondary | ICD-10-CM

## 2021-12-20 ENCOUNTER — Encounter: Payer: Self-pay | Admitting: Family Medicine

## 2021-12-20 ENCOUNTER — Other Ambulatory Visit: Payer: Self-pay

## 2021-12-20 DIAGNOSIS — O210 Mild hyperemesis gravidarum: Secondary | ICD-10-CM

## 2021-12-20 MED ORDER — PROMETHAZINE HCL 25 MG RE SUPP: 25.0000 mg | Freq: Four times a day (QID) | RECTAL | 0 refills | Status: DC | PRN

## 2021-12-20 NOTE — Telephone Encounter (Signed)
Patient calls nurse line requesting refill on promethazine suppositories. Called pharmacy as rx was sent over on 12/20 with 1 refill.   Patient filled 12 suppositories on 12/20 and refilled again on 12/23.   Please advise if refill can be sent in. Forwarding to PCP and Dr. Clent Ridges, as she saw patient last and prescribed medication.   Veronda Prude, RN

## 2021-12-24 NOTE — L&D Delivery Note (Signed)
OB/GYN Faculty Practice Delivery Note  Susan Crawford is a 36 y.o. [id number] s/p SVD at [redacted]w[redacted]d. She was admitted for IOL d/t IUGR.   ROM: 1h 21m with clear fluid GBS Status: Negative Maximum Maternal Temperature: 98.3  Labor Progress: Patient presented for IOL with known IUGR and CHTN on mediations. She received a foley bulb, cytotec, and pitocin. After an epidural,  AROM was performed and she progressed to complete.  She delivered as below with staff and family support.   Delivery Date/Time: Thursday July 12, 2022 at  Delivery: Called to room and patient was complete and infant descending with contractions and no maternal effort. FOB-Marque gloved and ready to assist. After delivery of head, nuchal cord noted that shoulders and body was delivered through via somersault maneuver.  Infant with spontaneous cry and dried and stimulated by provider while FOB holding infant. Infant placed on mother's abdomen where nurses continued to dry and stimulate.  Cord clamped x 2 after 1-minute delay, and cut by Marque. Cord blood drawn. Placenta delivered spontaneously, via [facility],  with gentle cord traction and noted to be  Fundus firm with massage and Pitocin. Labia, perineum, vagina, and cervix inspected and noted to be intact.  Family wishes for infant to be circumcised during inpatient stay.  Consent completed and placed in separate note in mother's chart.   Placenta: Intact, 3VC, Disposal Complications: None Lacerations: None EBL: 125 Analgesia: Epidural  Postpartum Planning [X]  Desires Circumcision; Consent completed [X]  PP Follow up visit request sent  Infant: Female;   Blair Heys MSN, CNM Advanced Practice Provider, [facility]   [id number]

## 2021-12-26 ENCOUNTER — Ambulatory Visit: Payer: Medicaid Other

## 2022-01-04 ENCOUNTER — Ambulatory Visit (INDEPENDENT_AMBULATORY_CARE_PROVIDER_SITE_OTHER): Payer: Medicaid Other | Admitting: Family Medicine

## 2022-01-04 ENCOUNTER — Other Ambulatory Visit: Payer: Self-pay

## 2022-01-04 VITALS — BP 132/80 | HR 100 | Wt 201.0 lb

## 2022-01-04 DIAGNOSIS — Z3401 Encounter for supervision of normal first pregnancy, first trimester: Secondary | ICD-10-CM

## 2022-01-04 LAB — POCT 1 HR PRENATAL GLUCOSE: Glucose 1 Hr Prenatal, POC: 134 mg/dL

## 2022-01-04 NOTE — Progress Notes (Signed)
°  Patient Name: Susan Crawford Date of Birth: May 08, 1986 [place] Prenatal Visit  Susan Crawford is a 36 y.o. [phone] at [redacted]w[redacted]d here for routine follow up. She is dated by LMP.  She reports nausea.  She denies vaginal bleeding.  See flow sheet for details.  Vitals:   01/04/22 1534  BP: 132/80  Pulse: 100     A/P: Pregnancy at [redacted]w[redacted]d.  Doing well.    Routine Prenatal Care:  Dating reviewed, dating tab is correct Fetal heart tones  Too early in pregnancy to detect* Influenza vaccine not administered as patient declined, will continue to discuss.   The patient has the following indication for screening preexisting diabetes: BMI > 25 and high risk ethnicity (Latino, [place] American, Native American, [place], Asian Optometrist) . Pregnancy education including expected weight gain in pregnancy, OTC medication use, continued use of prenatal vitamin, smoking cessation if applicable, and nutrition in pregnancy.   Bleeding and pain precautions reviewed.  2. Pregnancy issues include the following and were addressed as appropriate today:  Patient requested to have referral to OB for continuity of care prior to delivery.   Nausea continues but has improved with Phenergan 25 mg supp.  Weight increased and no vomiting. Problem list  and pregnancy box updated: Yes.   Follow up with OB

## 2022-01-08 ENCOUNTER — Encounter: Payer: Self-pay | Admitting: Family Medicine

## 2022-01-17 ENCOUNTER — Inpatient Hospital Stay (HOSPITAL_COMMUNITY): Payer: Medicaid Other

## 2022-01-17 ENCOUNTER — Inpatient Hospital Stay (HOSPITAL_COMMUNITY)
Admission: AD | Admit: 2022-01-17 | Discharge: 2022-01-17 | Disposition: A | Discharge status: Home / Self Care | Payer: Medicaid Other | Attending: Obstetrics and Gynecology | Admitting: Obstetrics and Gynecology

## 2022-01-17 ENCOUNTER — Encounter (HOSPITAL_COMMUNITY): Payer: Self-pay | Admitting: Obstetrics and Gynecology

## 2022-01-17 DIAGNOSIS — R319 Hematuria, unspecified: Secondary | ICD-10-CM | POA: Diagnosis not present

## 2022-01-17 DIAGNOSIS — R109 Unspecified abdominal pain: Secondary | ICD-10-CM | POA: Insufficient documentation

## 2022-01-17 DIAGNOSIS — O26891 Other specified pregnancy related conditions, first trimester: Secondary | ICD-10-CM | POA: Diagnosis present

## 2022-01-17 DIAGNOSIS — Z3A12 12 weeks gestation of pregnancy: Secondary | ICD-10-CM | POA: Insufficient documentation

## 2022-01-17 DIAGNOSIS — O219 Vomiting of pregnancy, unspecified: Secondary | ICD-10-CM | POA: Diagnosis not present

## 2022-01-17 DIAGNOSIS — O09521 Supervision of elderly multigravida, first trimester: Secondary | ICD-10-CM | POA: Insufficient documentation

## 2022-01-17 DIAGNOSIS — N1339 Other hydronephrosis: Secondary | ICD-10-CM | POA: Diagnosis not present

## 2022-01-17 DIAGNOSIS — Z87442 Personal history of urinary calculi: Secondary | ICD-10-CM | POA: Diagnosis not present

## 2022-01-17 HISTORY — DX: Essential (primary) hypertension: I10

## 2022-01-17 LAB — URINALYSIS, ROUTINE W REFLEX MICROSCOPIC
Bacteria, UA: NONE SEEN
Bilirubin Urine: NEGATIVE
Glucose, UA: NEGATIVE mg/dL
Ketones, ur: NEGATIVE mg/dL
Leukocytes,Ua: NEGATIVE
Nitrite: NEGATIVE
Protein, ur: NEGATIVE mg/dL
RBC / HPF: >50 RBC/hpf — ABNORMAL HIGH (ref 0–5)
Specific Gravity, Urine: 1.016 (ref 1.005–1.030)
pH: 7 (ref 5.0–8.0)

## 2022-01-17 LAB — CBC WITH DIFFERENTIAL/PLATELET
Abs Immature Granulocytes: 0.06 10*3/uL (ref 0.00–0.07)
Basophils Absolute: 0 10*3/uL (ref 0.0–0.1)
Basophils Relative: 0 %
Eosinophils Absolute: 0.1 10*3/uL (ref 0.0–0.5)
Eosinophils Relative: 1 %
HCT: 36.4 % (ref 36.0–46.0)
Hemoglobin: 11.2 g/dL — ABNORMAL LOW (ref 12.0–15.0)
Immature Granulocytes: 1 %
Lymphocytes Relative: 18 %
Lymphs Abs: 2 10*3/uL (ref 0.7–4.0)
MCH: 27.9 pg (ref 26.0–34.0)
MCHC: 30.8 g/dL (ref 30.0–36.0)
MCV: 90.5 fL (ref 80.0–100.0)
Monocytes Absolute: 0.8 10*3/uL (ref 0.1–1.0)
Monocytes Relative: 7 %
Neutro Abs: 8.5 10*3/uL — ABNORMAL HIGH (ref 1.7–7.7)
Neutrophils Relative %: 73 %
Platelets: 295 10*3/uL (ref 150–400)
RBC: 4.02 MIL/uL (ref 3.87–5.11)
RDW: 12 % (ref 11.5–15.5)
WBC: 11.5 10*3/uL — ABNORMAL HIGH (ref 4.0–10.5)
nRBC: 0 % (ref 0.0–0.2)

## 2022-01-17 LAB — COMPREHENSIVE METABOLIC PANEL
ALT: 15 U/L (ref 0–44)
AST: 13 U/L — ABNORMAL LOW (ref 15–41)
Albumin: 3.4 g/dL — ABNORMAL LOW (ref 3.5–5.0)
Alkaline Phosphatase: 61 U/L (ref 38–126)
Anion gap: 10 (ref 5–15)
BUN: 6 mg/dL (ref 6–20)
CO2: 22 mmol/L (ref 22–32)
Calcium: 9.5 mg/dL (ref 8.9–10.3)
Chloride: 102 mmol/L (ref 98–111)
Creatinine, Ser: 0.66 mg/dL (ref 0.44–1.00)
GFR, Estimated: >60 mL/min (ref >60)
Glucose, Bld: 91 mg/dL (ref 70–99)
Potassium: 3.8 mmol/L (ref 3.5–5.1)
Sodium: 134 mmol/L — ABNORMAL LOW (ref 135–145)
Total Bilirubin: 0.2 mg/dL — ABNORMAL LOW (ref 0.3–1.2)
Total Protein: 7.4 g/dL (ref 6.5–8.1)

## 2022-01-17 MED ORDER — OXYCODONE HCL 5 MG PO TABS: 5.0000 mg | ORAL_TABLET | ORAL | 0 refills | Status: DC | PRN

## 2022-01-17 MED ORDER — IBUPROFEN 600 MG PO TABS: 600.0000 mg | ORAL_TABLET | Freq: Four times a day (QID) | ORAL | 0 refills | Status: DC | PRN

## 2022-01-17 MED ORDER — ACETAMINOPHEN 500 MG PO TABS
1000.0000 mg | ORAL_TABLET | Freq: Four times a day (QID) | ORAL | 0 refills | Status: DC | PRN
Start: 2022-01-17 — End: 2022-07-13

## 2022-01-17 MED ORDER — FENTANYL CITRATE (PF) 100 MCG/2ML IJ SOLN
50.0000 ug | Freq: Once | INTRAMUSCULAR | Status: AC
  Administered 2022-01-17: 08:00:00 50 ug via INTRAMUSCULAR
  Filled 2022-01-17: qty 2

## 2022-01-17 MED ORDER — KETOROLAC TROMETHAMINE 30 MG/ML IJ SOLN
30.0000 mg | Freq: Once | INTRAMUSCULAR | Status: AC
  Administered 2022-01-17: 09:00:00 30 mg via INTRAVENOUS
  Filled 2022-01-17: qty 1

## 2022-01-17 MED ORDER — LACTATED RINGERS IV BOLUS
1000.0000 mL | Freq: Once | INTRAVENOUS | Status: AC
  Administered 2022-01-17: 09:00:00 1000 mL via INTRAVENOUS

## 2022-01-17 NOTE — MAU Note (Signed)
About 1230, got this pain in her left side (mid lateral). Thought it was gas, walked around did pass some gas. When she woke up this morning, it came back  left side and and LLQ of abd. No bleeding or d/c. Has been vomiting, take suppositories for it. No urinary problems,no diarrhea.

## 2022-01-17 NOTE — MAU Provider Note (Signed)
Chief Complaint: Abdominal Pain   Event Date/Time   First Provider Initiated Contact with Patient 01/17/22 0734        SUBJECTIVE HPI: Susan Crawford is a 36 y.o. [id number] at [redacted]w[redacted]d by LMP who presents to maternity admissions reporting flank pain on left.  Started at [facility] and got worse this AM.  Did not improve after bowel movement.  Had vomiting but this is not new.  States pain does feel similar to prior kidney stone in past. . She denies vaginal bleeding, vaginal itching/burning, or fever/chills.     Abdominal Pain This is a new problem. The current episode started today. The problem occurs constantly. The problem has been unchanged. Pain location: left middle abdomen. Nothing aggravates the pain. The pain is relieved by Nothing. She has tried nothing for the symptoms.  RN Note: About [id number], got this pain in her left side (mid lateral). Thought it was gas, walked around did pass some gas. When she woke up this morning, it came back  left side and and LLQ of abd. No bleeding or d/c. Has been vomiting, take suppositories for it. No urinary problems,no diarrhea   Past Medical History:  Diagnosis Date   Asthma    Hypertension    'goes crazy with pregnancies"   Kidney stone 2014   Preterm labor    Past Surgical History:  Procedure Laterality Date   BREAST LUMPECTOMY     Social History   Socioeconomic History   Marital status: Single    Spouse name: Not on file   Number of children: Not on file   Years of education: Not on file   Highest education level: Not on file  Occupational History   Not on file  Tobacco Use   Smoking status: Never   Smokeless tobacco: Never  Vaping Use   Vaping Use: Never used  Substance and Sexual Activity   Alcohol use: No    Comment: rare   Drug use: No   Sexual activity: Yes    Partners: Male    Birth control/protection: I.U.D., None  Other Topics Concern   Not on file  Social History Narrative   Not on file   Social Determinants of Health    Financial Resource Strain: Not on file  Food Insecurity: Not on file  Transportation Needs: Not on file  Physical Activity: Not on file  Stress: Not on file  Social Connections: Not on file  Intimate Partner Violence: Not on file   No current facility-administered medications on file prior to encounter.   Current Outpatient Medications on File Prior to Encounter  Medication Sig Dispense Refill   Prenatal Vit-Fe Fumarate-FA (PRENATAL VITAMINS) 28-0.8 MG TABS Take 1 tablet by mouth daily. 90 tablet 3   promethazine (PHENERGAN) 25 MG suppository Place 1 suppository (25 mg total) rectally every 6 (six) hours as needed for nausea, vomiting or refractory nausea / vomiting. 30 suppository 0   Doxylamine-Pyridoxine (DICLEGIS) 10-10 MG TBEC Take 1 tablet by mouth every 8 (eight) hours. 90 tablet 2   No Known Allergies  I have reviewed patient's Past Medical Hx, Surgical Hx, Family Hx, Social Hx, medications and allergies.   ROS:  Review of Systems  Gastrointestinal:  Positive for abdominal pain.  Review of Systems  Other systems negative   Physical Exam  Physical Exam Constitutional:      General: She is not in acute distress.    Appearance: She is not ill-appearing or toxic-appearing.  HENT:  Head: Normocephalic.  Cardiovascular:     Rate and Rhythm: Normal rate.  Pulmonary:     Effort: Pulmonary effort is normal.  Abdominal:     General: Bowel sounds are normal. There is no distension.     Palpations: Abdomen is soft.     Tenderness: There is left CVA tenderness and guarding.    Skin:    General: Skin is warm and dry.  Neurological:     General: No focal deficit present.     Mental Status: She is alert.  Psychiatric:        Mood and Affect: Mood normal.   Patient Vitals for the past 24 hrs:  BP Temp Temp src Pulse Resp SpO2 Height Weight  01/17/22 0716 132/79 98.1 F (36.7 C) Oral 96 18 99 % 5\' 5"  (1.651 m) 91.9 kg   FHT 145 by doppler  MAU  Management/MDM: Ordered UA and Renal ultrrasound to rule out UTI/Pyelo/Stones CBC/Diff and CMET ordered Will order some analgesia  ASSESSMENT Single IUP at [redacted]w[redacted]d Left flank pain   Hansel Feinstein CNM, MSN Certified Nurse-Midwife 01/17/2022  7:34 AM   Resumed care of the patient at 0800 Toradol given 30 mg IV with LR bolus x 1 Patient rating pain 4/10 at this time Urine culture ordered and pending.    1. Left flank pain   2. Other hydronephrosis   3. [redacted] weeks gestation of pregnancy   4. Hematuria of unknown cause      P:  Discharge home Rx: Roxicodone, ibuprofen, tylenol Return to MAU if symptoms worsen Drink fluids  Hadja Harral, Artist Pais, NP 01/17/2022 1:39 PM

## 2022-01-18 LAB — CULTURE, OB URINE: Culture: >=100000 — AB

## 2022-01-20 ENCOUNTER — Telehealth: Payer: Self-pay | Admitting: Women's Health

## 2022-01-20 ENCOUNTER — Other Ambulatory Visit: Payer: Self-pay | Admitting: Obstetrics and Gynecology

## 2022-01-20 MED ORDER — SULFAMETHOXAZOLE-TRIMETHOPRIM 800-160 MG PO TABS: 1.0000 | ORAL_TABLET | Freq: Two times a day (BID) | ORAL | 0 refills | Status: DC

## 2022-01-20 NOTE — Progress Notes (Signed)
+   urine culture, patient was seen in MAU with flank pain Rx Bactrim sent.  Duane Lope, NP 01/20/2022 8:45 PM

## 2022-01-20 NOTE — Telephone Encounter (Signed)
Per infectious disease, urine culture should be repeated as bacteria that grew out in culture is a normal skin flora and is likely contamination. However, if patient is symptomatic, can treat with Bactrim DS BID x3 days, but best course is to repeat urine culture. Attempted to call patient to alert to need for repeat UC. Called mobile number listed in chart and someone other than patient answered and said patient was unavailable, but that she could be reached later tonight. Will either attempt to call patient again tonight or patient can be called again tomorrow morning.  Marylen Ponto, NP  7:44 PM 01/20/2022

## 2022-01-29 ENCOUNTER — Other Ambulatory Visit: Payer: Self-pay | Admitting: Family Medicine

## 2022-01-29 DIAGNOSIS — O210 Mild hyperemesis gravidarum: Secondary | ICD-10-CM

## 2022-02-02 ENCOUNTER — Other Ambulatory Visit: Payer: Self-pay

## 2022-02-02 ENCOUNTER — Ambulatory Visit (INDEPENDENT_AMBULATORY_CARE_PROVIDER_SITE_OTHER): Payer: Medicaid Other | Admitting: Family Medicine

## 2022-02-02 ENCOUNTER — Encounter: Payer: Self-pay | Admitting: Family Medicine

## 2022-02-02 VITALS — BP 135/90 | HR 105 | Ht 65.0 in | Wt 212.0 lb

## 2022-02-02 DIAGNOSIS — Z3A14 14 weeks gestation of pregnancy: Secondary | ICD-10-CM

## 2022-02-02 DIAGNOSIS — Z3491 Encounter for supervision of normal pregnancy, unspecified, first trimester: Secondary | ICD-10-CM

## 2022-02-02 DIAGNOSIS — O2342 Unspecified infection of urinary tract in pregnancy, second trimester: Secondary | ICD-10-CM

## 2022-02-02 DIAGNOSIS — N1330 Unspecified hydronephrosis: Secondary | ICD-10-CM

## 2022-02-02 DIAGNOSIS — O210 Mild hyperemesis gravidarum: Secondary | ICD-10-CM

## 2022-02-02 MED ORDER — PROMETHAZINE HCL 25 MG RE SUPP: 25.0000 mg | Freq: Four times a day (QID) | RECTAL | 0 refills | Status: DC | PRN

## 2022-02-02 NOTE — Patient Instructions (Signed)
Thank you for coming to see me today. It was a pleasure. Today we discussed your symptoms, sounds like the urine infection has gone away-rechecking your urine again to make. Also checking kidney function.  Go to the MAU at [facility] if: You have cramping/contractions that do not go away with drinking water Your water breaks.  Sometimes it is a big gush of fluid, sometimes it is just a trickle that keeps getting your underwear wet or running down your legs You have vaginal bleeding.    You do not feel your baby moving like normal.  If you do not, get something to eat and drink and lay down and focus on feeling your baby move. If your baby is still not moving like normal, you should go to MAU.   Please follow-up with OB.   If you have any questions or concerns, please do not hesitate to call the office at [phone].  Best wishes,   Dr Allena Katz

## 2022-02-02 NOTE — Progress Notes (Signed)
° ° ° °  SUBJECTIVE:   CHIEF COMPLAINT / HPI:   LYDIANNA GIANGRANDE is a 36 y.o. female presents for follow up    Pt is G5P3013 at [redacted]w[redacted]d currently. Seen on 01/17/22 for left sided flank pain. Renal US showed left sided hydronephrosis. Urine cx grew > 100,000 COLONIES/mL Diptheroid's. ID was consulted who recommended treating with Bactrim for 3 days and repeating urine cx. Pt has completed course of abx. She feels better now. Denies lower abdominal/flank pain, dysuria, frequency, urgency or hematuria.  Denies fetal movement yet but feels flutters. Denies fluid leakage, contractions or vaginal bleeding.    Flowsheet Row Routine Prenatal from 01/04/2022 in [place]  PHQ-9 Total Score 0        PERTINENT  PMH / PSH: asthma  OBJECTIVE:   BP 135/90    Pulse (!) 105    Ht 5\' 5"  (1.651 m)    Wt 212 lb (96.2 kg)    LMP 10/24/2021 (Exact Date)    SpO2 99%    BMI 35.28 kg/m    General: Alert, no acute distress Cardio: well perfused  Pulm: normal work of breathing Abdomen: Soft non tender, bowel sounds present Extremities: No peripheral edema.  Neuro: Cranial nerves grossly intact   Fetal HR: 151 bpm  ASSESSMENT/PLAN:   UTI (urinary tract infection) during pregnancy, second trimester Resolved. No further symptoms. Obtained repeat OB culture today and BMP to check kidney function given pt had hydronephrosis on recent US. Pt is transferring the recent of OB care to [facility] centre. Refilled phenergan suppositories for nausea/vomiting. MAU precautions given to pt.    Lattie Haw, MD PGY-3 [place]

## 2022-02-03 LAB — BASIC METABOLIC PANEL
BUN/Creatinine Ratio: 10 (ref 9–23)
BUN: 6 mg/dL (ref 6–20)
CO2: 21 mmol/L (ref 20–29)
Calcium: 9.7 mg/dL (ref 8.7–10.2)
Chloride: 101 mmol/L (ref 96–106)
Creatinine, Ser: 0.6 mg/dL (ref 0.57–1.00)
Glucose: 111 mg/dL — ABNORMAL HIGH (ref 70–99)
Potassium: 4 mmol/L (ref 3.5–5.2)
Sodium: 137 mmol/L (ref 134–144)
eGFR: 119 mL/min/{1.73_m2} (ref >59)

## 2022-02-04 LAB — URINE CULTURE, OB REFLEX

## 2022-02-04 LAB — CULTURE, OB URINE

## 2022-02-05 DIAGNOSIS — O2342 Unspecified infection of urinary tract in pregnancy, second trimester: Secondary | ICD-10-CM | POA: Insufficient documentation

## 2022-02-05 NOTE — Assessment & Plan Note (Addendum)
Resolved. No further symptoms. Obtained repeat OB culture today and BMP to check kidney function given pt had hydronephrosis on recent US. Pt is transferring the recent of OB care to [facility] centre. Refilled phenergan suppositories for nausea/vomiting. MAU precautions given to pt.

## 2022-02-15 ENCOUNTER — Encounter: Payer: Self-pay | Admitting: Obstetrics and Gynecology

## 2022-02-15 ENCOUNTER — Ambulatory Visit (INDEPENDENT_AMBULATORY_CARE_PROVIDER_SITE_OTHER): Payer: Medicaid Other | Admitting: Obstetrics and Gynecology

## 2022-02-15 ENCOUNTER — Other Ambulatory Visit: Payer: Self-pay

## 2022-02-15 VITALS — BP 135/88 | HR 91 | Wt 214.4 lb

## 2022-02-15 DIAGNOSIS — O10919 Unspecified pre-existing hypertension complicating pregnancy, unspecified trimester: Secondary | ICD-10-CM

## 2022-02-15 DIAGNOSIS — O0990 Supervision of high risk pregnancy, unspecified, unspecified trimester: Secondary | ICD-10-CM | POA: Insufficient documentation

## 2022-02-15 DIAGNOSIS — O210 Mild hyperemesis gravidarum: Secondary | ICD-10-CM

## 2022-02-15 MED ORDER — ASPIRIN EC 81 MG PO TBEC: 81.0000 mg | DELAYED_RELEASE_TABLET | Freq: Every day | ORAL | 2 refills | Status: DC

## 2022-02-15 MED ORDER — BLOOD PRESSURE MONITORING DEVI: 1.0000 | 0 refills | Status: DC

## 2022-02-15 MED ORDER — PROMETHAZINE HCL 25 MG RE SUPP: 25.0000 mg | Freq: Four times a day (QID) | RECTAL | 1 refills | Status: DC | PRN

## 2022-02-15 MED ORDER — PREPLUS 27-1 MG PO TABS: 1.0000 | ORAL_TABLET | Freq: Every day | ORAL | 13 refills | Status: AC

## 2022-02-15 NOTE — Patient Instructions (Signed)

## 2022-02-16 LAB — COMPREHENSIVE METABOLIC PANEL
ALT: 12 IU/L (ref 0–32)
AST: 15 IU/L (ref 0–40)
Albumin/Globulin Ratio: 1.3 (ref 1.2–2.2)
Albumin: 4 g/dL (ref 3.8–4.8)
Alkaline Phosphatase: 64 IU/L (ref 44–121)
BUN/Creatinine Ratio: 12 (ref 9–23)
BUN: 7 mg/dL (ref 6–20)
Bilirubin Total: <0.2 mg/dL (ref 0.0–1.2)
CO2: 19 mmol/L — ABNORMAL LOW (ref 20–29)
Calcium: 9.7 mg/dL (ref 8.7–10.2)
Chloride: 100 mmol/L (ref 96–106)
Creatinine, Ser: 0.57 mg/dL (ref 0.57–1.00)
Globulin, Total: 3.2 g/dL (ref 1.5–4.5)
Glucose: 88 mg/dL (ref 70–99)
Potassium: 3.9 mmol/L (ref 3.5–5.2)
Sodium: 134 mmol/L (ref 134–144)
Total Protein: 7.2 g/dL (ref 6.0–8.5)
eGFR: 121 mL/min/{1.73_m2} (ref >59)

## 2022-02-16 LAB — HEMOGLOBIN A1C
Est. average glucose Bld gHb Est-mCnc: 88 mg/dL
Hgb A1c MFr Bld: 4.7 % — ABNORMAL LOW (ref 4.8–5.6)

## 2022-02-16 LAB — PROTEIN / CREATININE RATIO, URINE
Creatinine, Urine: 157.1 mg/dL
Protein, Ur: 23.2 mg/dL
Protein/Creat Ratio: 148 mg/g creat (ref 0–200)

## 2022-02-16 LAB — TSH: TSH: 0.651 u[IU]/mL (ref 0.450–4.500)

## 2022-02-16 NOTE — Progress Notes (Signed)
Subjective:  °Susan Crawford is a 35 y.o. G5P3013 at [redacted]w[redacted]d being seen today for ongoing prenatal care. Transferred from [facility].  She is currently monitored for the following issues for this high-risk pregnancy and has Seasonal allergies; ASTHMA, PERSISTENT; Health maintenance examination; Routine screening for STI (sexually transmitted infection); Chronic hypertension affecting pregnancy; and Supervision of high risk pregnancy, antepartum on their problem list. ° °Patient reports no complaints.  Contractions: Not present. Vag. Bleeding: None.   . Denies leaking of fluid.  ° °The following portions of the patient's history were reviewed and updated as appropriate: allergies, current medications, past family history, past medical history, past social history, past surgical history and problem list. Problem list updated. ° °Objective:  ° °Vitals:  ° 02/15/22 1559  °BP: 135/88  °Pulse: 91  °Weight: 97.3 kg  ° ° °Fetal Status: Fetal Heart Rate (bpm): 152        ° °General:  Alert, oriented and cooperative. Patient is in no acute distress.  °Skin: Skin is warm and dry. No rash noted.   °Cardiovascular: Normal heart rate noted  °Respiratory: Normal respiratory effort, no problems with respiration noted  °Abdomen: Soft, gravid, appropriate for gestational age. Pain/Pressure: Present     °Pelvic:  Cervical exam deferred        °Extremities: Normal range of motion.  Edema: None  °Mental Status: Normal mood and affect. Normal behavior. Normal judgment and thought content.  ° °Urinalysis:     ° °Assessment and Plan:  °Pregnancy: G5P3013 at [redacted]w[redacted]d ° °1. Supervision of high risk pregnancy, antepartum °Stable °- Genetic Screening °- TSH °- US MFM OB DETAIL +14 WK; Future °- Hemoglobin A1c °- Blood Pressure Monitoring DEVI; 1 each by Does not apply route once a week.  Dispense: 1 each; Refill: 0 ° °2. Chronic hypertension affecting pregnancy °BP stable without meds °Start qd BASA °CHTN and pregnancy reviewed with pt. °Serial growth  scans and antenatal testing as per protocol. °- Protein / creatinine ratio, urine °- Comp Met (CMET) °- aspirin EC 81 MG tablet; Take 1 tablet (81 mg total) by mouth daily. Take after 12 weeks for prevention of preeclampsia later in pregnancy  Dispense: 300 tablet; Refill: 2 °- Blood Pressure Monitoring DEVI; 1 each by Does not apply route once a week.  Dispense: 1 each; Refill: 0 ° °3. Hyperemesis gravidarum ° °- promethazine (PHENERGAN) 25 MG suppository; Place 1 suppository (25 mg total) rectally every 6 (six) hours as needed for nausea, vomiting or refractory nausea / vomiting.  Dispense: 30 suppository; Refill: 1 ° °Preterm labor symptoms and general obstetric precautions including but not limited to vaginal bleeding, contractions, leaking of fluid and fetal movement were reviewed in detail with the patient. °Please refer to After Visit Summary for other counseling recommendations.  °Return in about 4 weeks (around 03/15/2022) for OB visit, face to face, MD only. ° ° °Ervin, Michael L, MD °

## 2022-02-20 ENCOUNTER — Telehealth: Payer: Self-pay | Admitting: General Practice

## 2022-02-20 NOTE — Telephone Encounter (Signed)
Patient called and left message on nurse voicemail line stating she is almost [redacted] weeks pregnant and has a bad cold. She reports constant sneezing, blurry vision, drowsy, & just doesn't feel good in general. Patient would like to know what allergy medication she can take.   Called patient, no answer- left message stating we are trying to reach you to return your phone call, please call us back.

## 2022-02-21 ENCOUNTER — Telehealth: Payer: Self-pay | Admitting: *Deleted

## 2022-02-21 NOTE — Telephone Encounter (Signed)
Received a voicemail from 02/20/22 stating she called earlier and someone called her but she was asleep, so she called again. ?Per chart she called about meds for cold symptoms. I called [place] and left a message on her voicemail that I was returning her phone call and that I am sorry I did not reach her, but she can call us back or send MyChart message if she is still having issues.  ?Nancy Fetter ?

## 2022-02-27 ENCOUNTER — Telehealth: Payer: Self-pay

## 2022-03-07 ENCOUNTER — Ambulatory Visit: Payer: Medicaid Other | Attending: Obstetrics and Gynecology

## 2022-03-07 ENCOUNTER — Other Ambulatory Visit: Payer: Self-pay | Admitting: *Deleted

## 2022-03-07 ENCOUNTER — Other Ambulatory Visit: Payer: Self-pay

## 2022-03-07 ENCOUNTER — Ambulatory Visit: Payer: Medicaid Other

## 2022-03-07 ENCOUNTER — Ambulatory Visit: Payer: Medicaid Other | Attending: Obstetrics | Admitting: Obstetrics

## 2022-03-07 ENCOUNTER — Ambulatory Visit: Payer: Medicaid Other | Admitting: *Deleted

## 2022-03-07 VITALS — BP 140/82 | HR 102

## 2022-03-07 DIAGNOSIS — O99212 Obesity complicating pregnancy, second trimester: Secondary | ICD-10-CM

## 2022-03-07 DIAGNOSIS — O09522 Supervision of elderly multigravida, second trimester: Secondary | ICD-10-CM

## 2022-03-07 DIAGNOSIS — O0990 Supervision of high risk pregnancy, unspecified, unspecified trimester: Secondary | ICD-10-CM

## 2022-03-07 DIAGNOSIS — O10919 Unspecified pre-existing hypertension complicating pregnancy, unspecified trimester: Secondary | ICD-10-CM

## 2022-03-07 DIAGNOSIS — O10912 Unspecified pre-existing hypertension complicating pregnancy, second trimester: Secondary | ICD-10-CM

## 2022-03-07 DIAGNOSIS — Z3A19 19 weeks gestation of pregnancy: Secondary | ICD-10-CM | POA: Diagnosis not present

## 2022-03-08 NOTE — Progress Notes (Signed)
MFM Note ? ?Susan Crawford was seen for a detailed fetal anatomy scan due to maternal obesity and chronic hypertension that is not treated with any medications.  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 ? ?She denies any other significant past medical history and denies any problems in her current pregnancy.   ? ?She had a cell free DNA test earlier in her pregnancy which indicated a low risk for trisomy 99, 54, and 13. A female fetus is predicted. ? ?She was informed that the fetal growth and amniotic fluid level were appropriate for her gestational age.  ? ?There were no obvious fetal anomalies noted on today's ultrasound exam. ? ?The patient was informed that anomalies may be missed due to technical limitations. If the fetus is in a suboptimal position or maternal habitus is increased, visualization of the fetus in the maternal uterus may be impaired. ? ?The following were discussed during today's consultation: ? ?Chronic hypertension in pregnancy ? ?The implications and management of chronic hypertension in pregnancy was discussed.  ? ?The patient was advised that should her blood pressures be elevated later in her pregnancy, that she may have to be started on an hypertensive medication such as nifedipine or labetalol.   ? ?The increased risk of superimposed preeclampsia, an indicated preterm delivery, and possible fetal growth restriction due to chronic hypertension in pregnancy was discussed.  ? ?We will continue to follow her with monthly growth scans.  ? ?To decrease her risk of superimposed preeclampsia, she should continue taking a daily baby aspirin (81 mg daily) for preeclampsia prophylaxis.  ? 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 ?The patient was advised that spinal muscular atrophy and alpha thalassemia are both autosomal recessive conditions.   ? ?She was advised to have the father of the baby screened to determine if  he is also a carrier for either one of these two conditions. Should he also be a carrier, their baby will have a 25% chance of inheriting either disorder. ? ?The patient understands that an amniocentesis is available for definitive diagnosis of these conditions.  She declined any further testing today.   ? ?She was also offered and declined the opportunity to meet with our genetic counselor today. ? ?Due to chronic hypertension and obesity, a follow-up growth scan was scheduled in 4 weeks. ? ?The patient stated that all of her questions were answered. ? ?A total of 30 minutes was spent counseling and coordinating the care for this patient.  Greater than 50% of the time was spent in direct face-to-face contact. ?

## 2022-03-12 ENCOUNTER — Other Ambulatory Visit: Payer: Self-pay

## 2022-03-15 ENCOUNTER — Other Ambulatory Visit: Payer: Self-pay

## 2022-03-15 ENCOUNTER — Ambulatory Visit (INDEPENDENT_AMBULATORY_CARE_PROVIDER_SITE_OTHER): Payer: Medicaid Other | Admitting: Family Medicine

## 2022-03-15 VITALS — BP 143/92 | HR 82 | Wt 222.5 lb

## 2022-03-15 DIAGNOSIS — K2100 Gastro-esophageal reflux disease with esophagitis, without bleeding: Secondary | ICD-10-CM

## 2022-03-15 DIAGNOSIS — D563 Thalassemia minor: Secondary | ICD-10-CM

## 2022-03-15 DIAGNOSIS — Z148 Genetic carrier of other disease: Secondary | ICD-10-CM | POA: Insufficient documentation

## 2022-03-15 DIAGNOSIS — J302 Other seasonal allergic rhinitis: Secondary | ICD-10-CM

## 2022-03-15 DIAGNOSIS — O10919 Unspecified pre-existing hypertension complicating pregnancy, unspecified trimester: Secondary | ICD-10-CM

## 2022-03-15 DIAGNOSIS — O0990 Supervision of high risk pregnancy, unspecified, unspecified trimester: Secondary | ICD-10-CM

## 2022-03-15 MED ORDER — PANTOPRAZOLE SODIUM 40 MG PO TBEC: 40.0000 mg | DELAYED_RELEASE_TABLET | Freq: Every day | ORAL | 3 refills | Status: DC

## 2022-03-15 MED ORDER — LORATADINE 10 MG PO TABS: 10.0000 mg | ORAL_TABLET | Freq: Every day | ORAL | 1 refills | Status: DC

## 2022-03-15 MED ORDER — NIFEDIPINE ER OSMOTIC RELEASE 30 MG PO TB24: 30.0000 mg | ORAL_TABLET | Freq: Every day | ORAL | 1 refills | Status: DC

## 2022-03-15 NOTE — Progress Notes (Signed)
? ?  PRENATAL VISIT NOTE ? ?Subjective:  ?Susan Crawford is a 36 y.o. [phone] at [redacted]w[redacted]d being seen today for ongoing prenatal care.  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 ? ?Patient reports heartburn and allergy symptoms .  Contractions: Not present. Vag. Bleeding: None.  Movement: Present. Denies leaking of fluid.  ? ?The following portions of the patient's history were reviewed and updated as appropriate: allergies, current medications, past family history, past medical history, past social history, past surgical history and problem list.  ? ?Objective:  ? ?Vitals:  ? 03/15/22 1451  ?BP: (!) 143/92  ?Pulse: 82  ?Weight: 222 lb 8 oz (100.9 kg)  ? ? ?Fetal Status: Fetal Heart Rate (bpm): 150   Movement: Present    ? ?General:  Alert, oriented and cooperative. Patient is in no acute distress.  ?Skin: Skin is warm and dry. No rash noted.   ?Cardiovascular: Normal heart rate noted  ?Respiratory: Normal respiratory effort, no problems with respiration noted  ?Abdomen: Soft, gravid, appropriate for gestational age.  Pain/Pressure: Absent     ?Pelvic: Cervical exam deferred        ?Extremities: Normal range of motion.  Edema: None  ?Mental Status: Normal mood and affect. Normal behavior. Normal judgment and thought content.  ? ?Assessment and Plan:  ?Pregnancy: [id number] at [redacted]w[redacted]d ?1. Supervision of high risk pregnancy, antepartum ?Continue prenatal care. ? ?2. Chronic hypertension affecting pregnancy ?BP is not well controlled-->All BPs in last 3 visits are > [id number] systolic, will begin BP med ?On ASA ?- NIFEdipine (PROCARDIA XL) 30 MG 24 hr tablet; Take 1 tablet (30 mg total) by mouth daily.  Dispense: 90 tablet; Refill: 1 ? ?3. Seasonal allergies ?Trial  of Claritin ?- loratadine (CLARITIN) 10 MG tablet; Take 1 tablet (10 mg total) by mouth daily.  Dispense: 30 tablet; Refill: 1 ? ?4. 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 ? ?5. Alpha thalassemia silent carrier ?Carrier testing offered--partner in [employer] and not here right now ? ?6. Gastroesophageal reflux disease with esophagitis without hemorrhage ?Trial of PPI ?- pantoprazole (PROTONIX) 40 MG tablet; Take 1 tablet (40 mg total) by mouth daily.  Dispense: 30 tablet; Refill: 3 ? ?General obstetric precautions including but not limited to vaginal bleeding, contractions, leaking of fluid and fetal movement were reviewed in detail with the patient. ?Please refer to After Visit Summary for other counseling recommendations.  ? ?Return in 4 weeks (on 04/12/2022). ? ?Future Appointments  ?Date Time Provider Ewa Gentry  ?04/04/2022  3:15 PM [facility] NURSE [facility]  ?04/04/2022  3:30 PM [facility] US2 [facility] [place]  ?04/19/2022  9:35 AM Donnamae Jude, MD [facility]  ? ? ?Donnamae Jude, MD ? ?

## 2022-03-15 NOTE — Patient Instructions (Signed)

## 2022-04-04 ENCOUNTER — Ambulatory Visit: Payer: Medicaid Other | Admitting: *Deleted

## 2022-04-04 ENCOUNTER — Ambulatory Visit: Payer: Medicaid Other | Attending: Obstetrics

## 2022-04-04 VITALS — BP 129/73 | HR 101

## 2022-04-04 DIAGNOSIS — O10012 Pre-existing essential hypertension complicating pregnancy, second trimester: Secondary | ICD-10-CM | POA: Diagnosis not present

## 2022-04-04 DIAGNOSIS — O99212 Obesity complicating pregnancy, second trimester: Secondary | ICD-10-CM | POA: Insufficient documentation

## 2022-04-04 DIAGNOSIS — E669 Obesity, unspecified: Secondary | ICD-10-CM

## 2022-04-04 DIAGNOSIS — O0990 Supervision of high risk pregnancy, unspecified, unspecified trimester: Secondary | ICD-10-CM | POA: Diagnosis present

## 2022-04-04 DIAGNOSIS — O210 Mild hyperemesis gravidarum: Secondary | ICD-10-CM

## 2022-04-04 DIAGNOSIS — O10912 Unspecified pre-existing hypertension complicating pregnancy, second trimester: Secondary | ICD-10-CM | POA: Diagnosis present

## 2022-04-04 DIAGNOSIS — Z3A23 23 weeks gestation of pregnancy: Secondary | ICD-10-CM

## 2022-04-04 DIAGNOSIS — O09522 Supervision of elderly multigravida, second trimester: Secondary | ICD-10-CM | POA: Diagnosis not present

## 2022-04-04 DIAGNOSIS — Z362 Encounter for other antenatal screening follow-up: Secondary | ICD-10-CM

## 2022-04-04 DIAGNOSIS — O10919 Unspecified pre-existing hypertension complicating pregnancy, unspecified trimester: Secondary | ICD-10-CM

## 2022-04-05 ENCOUNTER — Other Ambulatory Visit: Payer: Self-pay | Admitting: *Deleted

## 2022-04-05 DIAGNOSIS — O09522 Supervision of elderly multigravida, second trimester: Secondary | ICD-10-CM

## 2022-04-05 DIAGNOSIS — D563 Thalassemia minor: Secondary | ICD-10-CM

## 2022-04-05 DIAGNOSIS — O10912 Unspecified pre-existing hypertension complicating pregnancy, second trimester: Secondary | ICD-10-CM

## 2022-04-05 DIAGNOSIS — Z6836 Body mass index (BMI) 36.0-36.9, adult: Secondary | ICD-10-CM

## 2022-04-05 DIAGNOSIS — O280 Abnormal hematological finding on antenatal screening of mother: Secondary | ICD-10-CM

## 2022-04-09 ENCOUNTER — Encounter (HOSPITAL_COMMUNITY): Payer: Self-pay | Admitting: Obstetrics and Gynecology

## 2022-04-09 ENCOUNTER — Telehealth: Payer: Self-pay | Admitting: General Practice

## 2022-04-09 ENCOUNTER — Inpatient Hospital Stay (HOSPITAL_COMMUNITY)
Admission: AD | Admit: 2022-04-09 | Discharge: 2022-04-09 | Disposition: A | Discharge status: Home / Self Care | Payer: Medicaid Other | Attending: Obstetrics and Gynecology | Admitting: Obstetrics and Gynecology

## 2022-04-09 ENCOUNTER — Other Ambulatory Visit: Payer: Self-pay

## 2022-04-09 DIAGNOSIS — G4452 New daily persistent headache (NDPH): Secondary | ICD-10-CM | POA: Insufficient documentation

## 2022-04-09 DIAGNOSIS — O10919 Unspecified pre-existing hypertension complicating pregnancy, unspecified trimester: Secondary | ICD-10-CM

## 2022-04-09 DIAGNOSIS — Z3A23 23 weeks gestation of pregnancy: Secondary | ICD-10-CM | POA: Diagnosis not present

## 2022-04-09 DIAGNOSIS — O10912 Unspecified pre-existing hypertension complicating pregnancy, second trimester: Secondary | ICD-10-CM | POA: Diagnosis not present

## 2022-04-09 DIAGNOSIS — O26892 Other specified pregnancy related conditions, second trimester: Secondary | ICD-10-CM | POA: Diagnosis present

## 2022-04-09 DIAGNOSIS — O99352 Diseases of the nervous system complicating pregnancy, second trimester: Secondary | ICD-10-CM | POA: Insufficient documentation

## 2022-04-09 DIAGNOSIS — Z3689 Encounter for other specified antenatal screening: Secondary | ICD-10-CM

## 2022-04-09 DIAGNOSIS — O26899 Other specified pregnancy related conditions, unspecified trimester: Secondary | ICD-10-CM

## 2022-04-09 LAB — COMPREHENSIVE METABOLIC PANEL
ALT: 18 U/L (ref 0–44)
AST: 21 U/L (ref 15–41)
Albumin: 3 g/dL — ABNORMAL LOW (ref 3.5–5.0)
Alkaline Phosphatase: 76 U/L (ref 38–126)
Anion gap: 9 (ref 5–15)
BUN: 5 mg/dL — ABNORMAL LOW (ref 6–20)
CO2: 21 mmol/L — ABNORMAL LOW (ref 22–32)
Calcium: 9.1 mg/dL (ref 8.9–10.3)
Chloride: 105 mmol/L (ref 98–111)
Creatinine, Ser: 0.7 mg/dL (ref 0.44–1.00)
GFR, Estimated: >60 mL/min (ref >60)
Glucose, Bld: 111 mg/dL — ABNORMAL HIGH (ref 70–99)
Potassium: 3.3 mmol/L — ABNORMAL LOW (ref 3.5–5.1)
Sodium: 135 mmol/L (ref 135–145)
Total Bilirubin: 0.2 mg/dL — ABNORMAL LOW (ref 0.3–1.2)
Total Protein: 6.7 g/dL (ref 6.5–8.1)

## 2022-04-09 LAB — URINALYSIS, ROUTINE W REFLEX MICROSCOPIC
Bilirubin Urine: NEGATIVE
Glucose, UA: NEGATIVE mg/dL
Hgb urine dipstick: NEGATIVE
Ketones, ur: NEGATIVE mg/dL
Leukocytes,Ua: NEGATIVE
Nitrite: NEGATIVE
Protein, ur: NEGATIVE mg/dL
Specific Gravity, Urine: 1.013 (ref 1.005–1.030)
pH: 6 (ref 5.0–8.0)

## 2022-04-09 LAB — CBC
HCT: 31.9 % — ABNORMAL LOW (ref 36.0–46.0)
Hemoglobin: 10 g/dL — ABNORMAL LOW (ref 12.0–15.0)
MCH: 27.9 pg (ref 26.0–34.0)
MCHC: 31.3 g/dL (ref 30.0–36.0)
MCV: 88.9 fL (ref 80.0–100.0)
Platelets: 275 10*3/uL (ref 150–400)
RBC: 3.59 MIL/uL — ABNORMAL LOW (ref 3.87–5.11)
RDW: 12.9 % (ref 11.5–15.5)
WBC: 12.5 10*3/uL — ABNORMAL HIGH (ref 4.0–10.5)
nRBC: 0 % (ref 0.0–0.2)

## 2022-04-09 LAB — PROTEIN / CREATININE RATIO, URINE
Creatinine, Urine: 112.29 mg/dL
Protein Creatinine Ratio: 0.06 mg/mg{Cre} (ref 0.00–0.15)
Total Protein, Urine: 7 mg/dL

## 2022-04-09 MED ORDER — CYCLOBENZAPRINE HCL 5 MG PO TABS
5.0000 mg | ORAL_TABLET | Freq: Once | ORAL | Status: AC
  Administered 2022-04-09: 5 mg via ORAL
  Filled 2022-04-09: qty 1

## 2022-04-09 MED ORDER — ACETAMINOPHEN 10 MG/ML IV SOLN
1000.0000 mg | Freq: Once | INTRAVENOUS | Status: AC
  Administered 2022-04-09: 1000 mg via INTRAVENOUS
  Filled 2022-04-09: qty 100

## 2022-04-09 MED ORDER — LABETALOL HCL 200 MG PO TABS: 200.0000 mg | ORAL_TABLET | Freq: Two times a day (BID) | ORAL | 3 refills | Status: DC

## 2022-04-09 MED ORDER — NIFEDIPINE ER OSMOTIC RELEASE 30 MG PO TB24: 30.0000 mg | ORAL_TABLET | Freq: Two times a day (BID) | ORAL | 1 refills | Status: DC

## 2022-04-09 MED ORDER — LACTATED RINGERS IV BOLUS
1000.0000 mL | Freq: Once | INTRAVENOUS | Status: AC
  Administered 2022-04-09: 1000 mL via INTRAVENOUS

## 2022-04-09 NOTE — MAU Note (Signed)
..  Susan Crawford is a 36 y.o. at [redacted]w[redacted]d here in MAU reporting: moderate HA on top of head, behind eyes, and causes dizziness since 1100. Pt called OB on call nurse and checked BP at work today around 1330 was about 140/80, 150/90. Pt stated she ate and took her BP meds twice around 0600 and 1530. Pt states having blurry vision. Pt states having occasion [employer]. Pt denies DFM, VB, LOF, right epigastric pain, abnormal edema, abnormal discharge, and complication in the pregnancy. ?HX of CHTN ? ?Onset of complaint: 1100 ?Pain score: 6/10 ?Vitals:  ? 04/09/22 2046  ?BP: 134/74  ?Pulse: (!) 112  ?Resp: 18  ?SpO2: 98%  ?   ?FHT:154 ?Lab orders placed from triage:  UA ? ?

## 2022-04-09 NOTE — MAU Provider Note (Signed)
Chief Complaint:  Headache ? ? Event Date/Time  ? First Provider Initiated Contact with Patient 04/09/22 2116   ?  ?HPI: Susan Crawford is a 36 y.o. [id number] at 7689w6d who presents to maternity admissions reporting a severe temporal headache that she also feels behind her eyes, causing dizziness and photophobia. Has been getting these headaches for the past two weeks (since shortly after starting her nifedipine). Has tried 500-650mg  of Tylenol but it has not worked, has not taken any today. Called the office and was told to take another nifedipine but that did not help her headache. Denies any other physical symptoms including vaginal bleeding/discharge, epigastric or flank pain.  ? ?Pregnancy Course: [facility]. Prenatal records reviewed. ? ?Past Medical History:  ?Diagnosis Date  ? Asthma   ? Hypertension   ? 'goes crazy with pregnancies"  ? Kidney stone 2014  ? Preterm labor   ? ?OB History  ?Gravida Para Term Preterm AB Living  ?5 3 3  0 1 3  ?SAB IAB Ectopic Multiple Live Births  ?1       3  ?  ?# Outcome Date GA Lbr Len/2nd Weight Sex Delivery Anes PTL Lv  ?5 Current           ?4 Term 2018     Vag-Spont   LIV  ?3 SAB 2017          ?2 Term 06/19/13 2929w0d 16:10 / 00:03 7 lb 13.7 oz (3.564 kg) M Vag-Spont EPI  LIV  ?   Birth Comments: WNL  ?1 Term 12/28/07 6472w0d   F Vag-Spont EPI  LIV  ? ?Past Surgical History:  ?Procedure Laterality Date  ? BREAST LUMPECTOMY    ? ?Family History  ?Problem Relation Age of Onset  ? Asthma Mother   ? Hypertension Mother   ? Hypertension Maternal Grandmother   ? Asthma Maternal Grandmother   ? Other Neg Hx   ? ?Social History  ? ?Tobacco Use  ? Smoking status: Never  ? Smokeless tobacco: Never  ?Vaping Use  ? Vaping Use: Never used  ?Substance Use Topics  ? Alcohol use: No  ?  Comment: rare  ? Drug use: No  ? ?No Known Allergies ?No medications prior to admission.  ? ?I have reviewed patient's Past Medical Hx, Surgical Hx, Family Hx, Social Hx, medications and allergies.   ? ?ROS:  ?Review of Systems  ?Constitutional:  Negative for fatigue and fever.  ?HENT:  Negative for congestion and sore throat.   ?Eyes:  Positive for photophobia. Negative for visual disturbance.  ?Respiratory:  Negative for chest tightness and shortness of breath.   ?Cardiovascular:  Negative for chest pain.  ?Gastrointestinal:  Negative for nausea and vomiting.  ?Genitourinary:  Negative for vaginal bleeding.  ?Neurological:  Positive for dizziness and headaches.  ? ?Physical Exam  ?Patient Vitals for the past 24 hrs: ? BP Temp src Pulse Resp SpO2 Height Weight  ?04/09/22 2340 (!) 117/59 -- (!) 101 -- -- -- --  ?04/09/22 2111 135/76 -- (!) 109 -- 98 % -- --  ?04/09/22 2046 134/74 Oral (!) 112 18 98 % 5\' 5"  (1.651 m) 223 lb 6.4 oz (101.3 kg)  ? ?Constitutional: Well-developed, well-nourished female curled on her side with low light and blanket up by her face to block out the light ?Cardiovascular: normal rate & rhythm ?Respiratory: normal effort ?GI: Abd soft, non-tender, gravid appropriate for gestational age. Pos BS x 4 ?MS: Extremities nontender, no edema, normal  ROM ?Neurologic: Alert and oriented x 4.  ?GU: no CVA tenderness ?Pelvic: exam deferred ? ?Fetal Tracing: reactive ?Baseline: 155 ?Variability: minimal but appropriate for gestational age ?Accelerations: 10x10 ?Decelerations: none ?Toco: relaxed ?  ?Labs: ?Results for orders placed or performed during the hospital encounter of 04/09/22 (from the past 24 hour(s))  ?Urinalysis, Routine w reflex microscopic     Status: Abnormal  ? Collection Time: 04/09/22  8:51 PM  ?Result Value Ref Range  ? Color, Urine YELLOW YELLOW  ? APPearance HAZY (A) CLEAR  ? Specific Gravity, Urine 1.013 1.005 - 1.030  ? pH 6.0 5.0 - 8.0  ? Glucose, UA NEGATIVE NEGATIVE mg/dL  ? Hgb urine dipstick NEGATIVE NEGATIVE  ? Bilirubin Urine NEGATIVE NEGATIVE  ? Ketones, ur NEGATIVE NEGATIVE mg/dL  ? Protein, ur NEGATIVE NEGATIVE mg/dL  ? Nitrite NEGATIVE NEGATIVE  ? Leukocytes,Ua  NEGATIVE NEGATIVE  ?Protein / creatinine ratio, urine     Status: None  ? Collection Time: 04/09/22  8:51 PM  ?Result Value Ref Range  ? Creatinine, Urine 112.29 mg/dL  ? Total Protein, Urine 7 mg/dL  ? Protein Creatinine Ratio 0.06 0.00 - 0.15 mg/mg[Cre]  ?CBC     Status: Abnormal  ? Collection Time: 04/09/22 10:17 PM  ?Result Value Ref Range  ? WBC 12.5 (H) 4.0 - 10.5 K/uL  ? RBC 3.59 (L) 3.87 - 5.11 MIL/uL  ? Hemoglobin 10.0 (L) 12.0 - 15.0 g/dL  ? HCT 31.9 (L) 36.0 - 46.0 %  ? MCV 88.9 80.0 - 100.0 fL  ? MCH 27.9 26.0 - 34.0 pg  ? MCHC 31.3 30.0 - 36.0 g/dL  ? RDW 12.9 11.5 - 15.5 %  ? Platelets 275 150 - 400 K/uL  ? nRBC 0.0 0.0 - 0.2 %  ?Comprehensive metabolic panel     Status: Abnormal  ? Collection Time: 04/09/22 10:17 PM  ?Result Value Ref Range  ? Sodium 135 135 - 145 mmol/L  ? Potassium 3.3 (L) 3.5 - 5.1 mmol/L  ? Chloride 105 98 - 111 mmol/L  ? CO2 21 (L) 22 - 32 mmol/L  ? Glucose, Bld 111 (H) 70 - 99 mg/dL  ? BUN 5 (L) 6 - 20 mg/dL  ? Creatinine, Ser 0.70 0.44 - 1.00 mg/dL  ? Calcium 9.1 8.9 - 10.3 mg/dL  ? Total Protein 6.7 6.5 - 8.1 g/dL  ? Albumin 3.0 (L) 3.5 - 5.0 g/dL  ? AST 21 15 - 41 U/L  ? ALT 18 0 - 44 U/L  ? Alkaline Phosphatase 76 38 - 126 U/L  ? Total Bilirubin 0.2 (L) 0.3 - 1.2 mg/dL  ? GFR, Estimated >60 >60 mL/min  ? Anion gap 9 5 - 15  ? ?Imaging:  ?No results found. ? ?MAU Course: ?Orders Placed This Encounter  ?Procedures  ? Urinalysis, Routine w reflex microscopic  ? CBC  ? Comprehensive metabolic panel  ? Protein / creatinine ratio, urine  ? Discharge patient  ? ?Meds ordered this encounter  ?Medications  ? acetaminophen (OFIRMEV) IV 1,000 mg  ?  Order Specific Question:   Is the patient UNABLE to take oral / enteral medications?  ?  Answer:   Yes  ? lactated ringers bolus 1,000 mL  ? cyclobenzaprine (FLEXERIL) tablet 5 mg  ? labetalol (NORMODYNE) 200 MG tablet  ?  Sig: Take 1 tablet (200 mg total) by mouth 2 (two) times daily.  ?  Dispense:  60 tablet  ?  Refill:  3  ?  Order  Specific Question:   Supervising Provider  ?  Answer:   Reva Bores [[id number]]  ? ?MDM: ?Offered Tylenol and caffeine for headache, pt declined caffeine so gave flexeril instead - complete relief of headache and dizziness. Suspect headaches are related to nifedipine, discussed with Dr. Alysia Penna who agreed and switched pt to 200mg  labetalol BID.  PEC labs normal. ?Assessment: ?1. New daily persistent headache   ?2. Pain of round ligament during pregnancy   ?3. [redacted] weeks gestation of pregnancy   ?4. Chronic hypertension affecting pregnancy   ?5. NST (non-stress test) reactive   ? ?Plan: ?Discharge home in stable condition with PEC and 2nd trimester precautions.  ?  ? Follow-up Information   ? ? [facility] for [facility] Follow up.   ?Specialty: Obstetrics and Gynecology ?Why: as scheduled for ongoing prenatal care ?Contact information: ?[address] ?[place] ?[phone] ? ?  ?  ? ?  ?  ? ?  ?  ?Allergies as of 04/09/2022   ?No Known Allergies ?  ? ?  ?Medication List  ?  ? ?STOP taking these medications   ? ?NIFEdipine 30 MG 24 hr tablet ?Commonly known as: Procardia XL ?  ? ?  ? ?TAKE these medications   ? ?acetaminophen 500 MG tablet ?Commonly known as: TYLENOL ?Take 2 tablets (1,000 mg total) by mouth every 6 (six) hours as needed. ?  ?aspirin EC 81 MG tablet ?Take 1 tablet (81 mg total) by mouth daily. Take after 12 weeks for prevention of preeclampsia later in pregnancy ?  ?Blood Pressure Monitoring Devi ?1 each by Does not apply route once a week. ?  ?labetalol 200 MG tablet ?Commonly known as: NORMODYNE ?Take 1 tablet (200 mg total) by mouth 2 (two) times daily. ?  ?loratadine 10 MG tablet ?Commonly known as: CLARITIN ?Take 1 tablet (10 mg total) by mouth daily. ?  ?pantoprazole 40 MG tablet ?Commonly known as: Protonix ?Take 1 tablet (40 mg total) by mouth daily. ?  ?PrePLUS 27-1 MG Tabs ?Take 1 tablet by mouth daily. ?  ?promethazine 25 MG  suppository ?Commonly known as: PHENERGAN ?Place 1 suppository (25 mg total) rectally every 6 (six) hours as needed for nausea, vomiting or refractory nausea / vomiting. ?  ? ?  ? ?04/11/2022, CNM, MSN, IBCLC

## 2022-04-09 NOTE — Telephone Encounter (Signed)
Patient called and left message on nurse voicemail line stating she has had more frequent headaches lately that won't go away and nothing is helping.  ? ?Called patient and she states the headaches started a few days ago- she has some dizziness with them. Patient states she has tried tylenol & rest which helps some but not completely. Patient is currently at work and had her blood pressure taken while I was on the phone and provided two readings of 141/85 & 133/93. She does not have a cuff at home. Per Dr Macon Large, she should increase nifedipine to BID and call back if headaches do not improve. Discussed with patient & new Rx sent to pharmacy. Advised patient to call back Thursday afternoon or Friday morning if headaches were not improving. Discussed Rx at [facility] & importance of having a cuff at home to check blood pressures. Patient verbalized understanding. ?

## 2022-04-16 ENCOUNTER — Telehealth: Payer: Self-pay | Admitting: General Practice

## 2022-04-16 ENCOUNTER — Ambulatory Visit (HOSPITAL_COMMUNITY)
Admission: RE | Admit: 2022-04-16 | Discharge: 2022-04-16 | Disposition: A | Discharge status: Home / Self Care | Payer: Medicaid Other | Source: Ambulatory Visit | Attending: Family Medicine | Admitting: Family Medicine

## 2022-04-16 ENCOUNTER — Other Ambulatory Visit: Payer: Self-pay | Admitting: Obstetrics and Gynecology

## 2022-04-16 DIAGNOSIS — M79661 Pain in right lower leg: Secondary | ICD-10-CM | POA: Insufficient documentation

## 2022-04-16 DIAGNOSIS — O210 Mild hyperemesis gravidarum: Secondary | ICD-10-CM

## 2022-04-16 MED ORDER — PROMETHAZINE HCL 25 MG RE SUPP: 25.0000 mg | Freq: Four times a day (QID) | RECTAL | 1 refills | Status: DC | PRN

## 2022-04-16 NOTE — Telephone Encounter (Signed)
Patient called and left message on nurse voicemail line stating she has been getting bad cramps/charlie horse pain in her calves but now it is starting to go up her legs. Patient states she has been trying to wear compression socks but it isn't helping.  ? ?Called patient and asked about her pain that she has been having. Patient states the pain normally occurs at night in both her calves that feels like a charlie horse, but last night the pain was in her right thigh and was very painful. Patient states the pain kept her up for a while last night and has persisted into today. She currently rates the pain at a 6. Discussed with Dr Crissie Reese who ordered venous doppler of right leg. Scheduled with Vascular Lab for 230 today and informed patient. Patient verbalized understanding. ?

## 2022-04-16 NOTE — Progress Notes (Signed)
Lower extremity venous has been completed.  ? ?Preliminary results in CV Proc.  ? ?Jernie Schutt Bentlie Withem ?04/16/2022 2:54 PM    ?

## 2022-04-18 ENCOUNTER — Encounter: Payer: Self-pay | Admitting: General Practice

## 2022-04-19 ENCOUNTER — Ambulatory Visit (INDEPENDENT_AMBULATORY_CARE_PROVIDER_SITE_OTHER): Payer: Medicaid Other | Admitting: Family Medicine

## 2022-04-19 VITALS — BP 135/87 | HR 107 | Wt 221.3 lb

## 2022-04-19 DIAGNOSIS — O10919 Unspecified pre-existing hypertension complicating pregnancy, unspecified trimester: Secondary | ICD-10-CM

## 2022-04-19 DIAGNOSIS — O0990 Supervision of high risk pregnancy, unspecified, unspecified trimester: Secondary | ICD-10-CM

## 2022-04-19 DIAGNOSIS — Z3A25 25 weeks gestation of pregnancy: Secondary | ICD-10-CM

## 2022-04-19 NOTE — Patient Instructions (Addendum)
Nasacort  for nasal congestion ?Third Trimester of Pregnancy ? ?The third trimester of pregnancy is from week 28 through week 40. This is months 7 through 9. The third trimester is a time when the unborn baby (fetus) is growing rapidly. At the end of the ninth month, the fetus is about 20 inches long and weighs 6-10 pounds. ?Body changes during your third trimester ?During the third trimester, your body will continue to go through many changes. The changes vary and generally return to normal after your baby is born. ?Physical changes ?Your weight will continue to increase. You can expect to gain 25-35 pounds (11-16 kg) by the end of the pregnancy if you begin pregnancy at a normal weight. If you are underweight, you can expect to gain 28-40 lb (about 13-18 kg), and if you are overweight, you can expect to gain 15-25 lb (about 7-11 kg). ?You may begin to get stretch marks on your hips, abdomen, and breasts. ?Your breasts will continue to grow and may hurt. A yellow fluid (colostrum) may leak from your breasts. This is the first milk you are producing for your baby. ?You may have changes in your hair. These can include thickening of your hair, rapid growth, and changes in texture. Some people also have hair loss during or after pregnancy, or hair that feels dry or thin. ?Your belly button may stick out. ?You may notice more swelling in your hands, face, or ankles. ?Health changes ?You may have heartburn. ?You may have constipation. ?You may develop hemorrhoids. ?You may develop swollen, bulging veins (varicose veins) in your legs. ?You may have increased body aches in the pelvis, back, or thighs. This is due to weight gain and increased hormones that are relaxing your joints. ?You may have increased tingling or numbness in your hands, arms, and legs. The skin on your abdomen may also feel numb. ?You may feel short of breath because of your expanding uterus. ?Other changes ?You may urinate more often because the fetus  is moving lower into your pelvis and pressing on your bladder. ?You may have more problems sleeping. This may be caused by the size of your abdomen, an increased need to urinate, and an increase in your body's metabolism. ?You may notice the fetus "dropping," or moving lower in your abdomen (lightening). ?You may have increased vaginal discharge. ?You may notice that you have pain around your pelvic bone as your uterus distends. ?Follow these instructions at home: ?Medicines ?Follow your health care provider's instructions regarding medicine use. Specific medicines may be either safe or unsafe to take during pregnancy. Do not take any medicines unless approved by your health care provider. ?Take a prenatal vitamin that contains at least 600 micrograms (mcg) of folic acid. ?Eating and drinking ?Eat a healthy diet that includes fresh fruits and vegetables, whole grains, good sources of protein such as meat, eggs, or tofu, and low-fat dairy products. ?Avoid raw meat and unpasteurized juice, milk, and cheese. These carry germs that can harm you and your baby. ?Eat 4 or 5 small meals rather than 3 large meals a day. ?You may need to take these actions to prevent or treat constipation: ?Drink enough fluid to keep your urine pale yellow. ?Eat foods that are high in fiber, such as beans, whole grains, and fresh fruits and vegetables. ?Limit foods that are high in fat and processed sugars, such as fried or sweet foods. ?Activity ?Exercise only as directed by your health care provider. Most people can continue their usual  exercise routine during pregnancy. Try to exercise for 30 minutes at least 5 days a week. Stop exercising if you experience contractions in the uterus. ?Stop exercising if you develop pain or cramping in the lower abdomen or lower back. ?Avoid heavy lifting. ?Do not exercise if it is very hot or humid or if you are at a high altitude. ?If you choose to, you may continue to have sex unless your health care  provider tells you not to. ?Relieving pain and discomfort ?Take frequent breaks and rest with your legs raised (elevated) if you have leg cramps or low back pain. ?Take warm sitz baths to soothe any pain or discomfort caused by hemorrhoids. Use hemorrhoid cream if your health care provider approves. ?Wear a supportive bra to prevent discomfort from breast tenderness. ?If you develop varicose veins: ?Wear support hose as told by your health care provider. ?Elevate your feet for 15 minutes, 3-4 times a day. ?Limit salt in your diet. ?Safety ?Talk to your health care provider before traveling far distances. ?Do not use hot tubs, steam rooms, or saunas. ?Wear your seat belt at all times when driving or riding in a car. ?Talk with your health care provider if someone is verbally or physically abusive to you. ?Preparing for birth ?To prepare for the arrival of your baby: ?Take prenatal classes to understand, practice, and ask questions about labor and delivery. ?Visit the hospital and tour the maternity area. ?Purchase a rear-facing car seat and make sure you know how to install it in your car. ?Prepare the baby's room or sleeping area. Make sure to remove all pillows and stuffed animals from the baby's crib to prevent suffocation. ?General instructions ?Avoid cat litter boxes and soil used by cats. These carry germs that can cause birth defects in the baby. If you have a cat, ask someone to clean the litter box for you. ?Do not douche or use tampons. Do not use scented sanitary pads. ?Do not use any products that contain nicotine or tobacco, such as cigarettes, e-cigarettes, and chewing tobacco. If you need help quitting, ask your health care provider. ?Do not use any herbal remedies, illegal drugs, or medicines that were not prescribed to you. Chemicals in these products can harm your baby. ?Do not drink alcohol. ?You will have more frequent prenatal exams during the third trimester. During a routine prenatal visit,  your health care provider will do a physical exam, perform tests, and discuss your overall health. Keep all follow-up visits. This is important. ?Where to find more information ?American Pregnancy Association: americanpregnancy.org ?[employer]st[employer]necologists: https://www.todd-brady.net/ ?Office on Women's Health: MightyReward.co.nz ?Contact a health care provider if you have: ?A fever. ?Mild pelvic cramps, pelvic pressure, or nagging pain in your abdominal area or lower back. ?Vomiting or diarrhea. ?Bad-smelling vaginal discharge or foul-smelling urine. ?Pain when you urinate. ?A headache that does not go away when you take medicine. ?Visual changes or see spots in front of your eyes. ?Get help right away if: ?Your water breaks. ?You have regular contractions less than 5 minutes apart. ?You have spotting or bleeding from your vagina. ?You have severe abdominal pain. ?You have difficulty breathing. ?You have chest pain. ?You have fainting spells. ?You have not felt your baby move for the time period told by your health care provider. ?You have new or increased pain, swelling, or redness in an arm or leg. ?Summary ?The third trimester of pregnancy is from week 28 through week 40 (months 7 through 9). ?  You may have more problems sleeping. This can be caused by the size of your abdomen, an increased need to urinate, and an increase in your body's metabolism. ?You will have more frequent prenatal exams during the third trimester. Keep all follow-up visits. This is important. ?This information is not intended to replace advice given to you by your health care provider. Make sure you discuss any questions you have with your health care provider. ?Document Revised: 05/18/2020 Document Reviewed: 03/24/2020 ?Elsevier Patient Education ? 2023 Elsevier Inc. ? ?

## 2022-04-19 NOTE — Progress Notes (Signed)
? ?  PRENATAL VISIT NOTE ? ?Subjective:  ?Susan Crawford is a 36 y.o. [phone] at [redacted]w[redacted]d being seen today for ongoing prenatal care.  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 ? ?Patient reports backache.  Contractions: Irritability. Vag. Bleeding: None.  Movement: Present. Denies leaking of fluid.  ? ?The following portions of the patient's history were reviewed and updated as appropriate: allergies, current medications, past family history, past medical history, past social history, past surgical history and problem list.  ? ?Objective:  ? ?Vitals:  ? 04/19/22 0949  ?BP: 135/87  ?Pulse: (!) 107  ?Weight: 221 lb 4.8 oz (100.4 kg)  ? ? ?Fetal Status: Fetal Heart Rate (bpm): 145 Fundal Height: 27 cm Movement: Present    ? ?General:  Alert, oriented and cooperative. Patient is in no acute distress.  ?Skin: Skin is warm and dry. No rash noted.   ?Cardiovascular: Normal heart rate noted  ?Respiratory: Normal respiratory effort, no problems with respiration noted  ?Abdomen: Soft, gravid, appropriate for gestational age.  Pain/Pressure: Present     ?Pelvic: Cervical exam deferred        ?Extremities: Normal range of motion.  Edema: Trace  ?Mental Status: Normal mood and affect. Normal behavior. Normal judgment and thought content.  ? ?Assessment and Plan:  ?Pregnancy: [id number] at [redacted]w[redacted]d ?1. Supervision of high risk pregnancy, antepartum ?Continue routine prenatal care. ?28 wk labs next visit ? ? ?2. Chronic hypertension affecting pregnancy ?On labetalol ?BP is well controlled ?Has f/u u/s for growth ?On AASA ? ?Preterm labor symptoms and general obstetric precautions including but not limited to vaginal bleeding, contractions, leaking of fluid and fetal  movement were reviewed in detail with the patient. ?Please refer to After Visit Summary for other counseling recommendations.  ? ?Return in 3 weeks (on 05/10/2022) for [place], 28 wk labs. ? ?Future Appointments  ?Date Time Provider [place]  ?05/10/2022  8:50 AM [facility] LAB [facility]  ?05/10/2022  9:15 AM Aletha Halim, MD [facility]  ?05/10/2022  3:15 PM [facility] NURSE [facility]  ?05/10/2022  3:30 PM [facility] US2 [facility] [place]  ?05/24/2022  2:55 PM Donnamae Jude, MD [facility]  ?06/07/2022  8:55 AM Danielle Rankin [facility]  ?06/21/2022 10:55 AM Aletha Halim, MD [facility]  ? ? ?Donnamae Jude, MD ? ?

## 2022-05-10 ENCOUNTER — Ambulatory Visit: Payer: Medicaid Other | Admitting: *Deleted

## 2022-05-10 ENCOUNTER — Ambulatory Visit (INDEPENDENT_AMBULATORY_CARE_PROVIDER_SITE_OTHER): Payer: Medicaid Other | Admitting: Obstetrics and Gynecology

## 2022-05-10 ENCOUNTER — Other Ambulatory Visit: Payer: Self-pay

## 2022-05-10 ENCOUNTER — Other Ambulatory Visit: Payer: Medicaid Other

## 2022-05-10 ENCOUNTER — Ambulatory Visit: Payer: Medicaid Other | Attending: Obstetrics and Gynecology

## 2022-05-10 VITALS — BP 121/86 | HR 95 | Wt 221.6 lb

## 2022-05-10 VITALS — BP 119/73 | HR 105

## 2022-05-10 DIAGNOSIS — Z148 Genetic carrier of other disease: Secondary | ICD-10-CM

## 2022-05-10 DIAGNOSIS — O09523 Supervision of elderly multigravida, third trimester: Secondary | ICD-10-CM | POA: Insufficient documentation

## 2022-05-10 DIAGNOSIS — Z362 Encounter for other antenatal screening follow-up: Secondary | ICD-10-CM

## 2022-05-10 DIAGNOSIS — O0990 Supervision of high risk pregnancy, unspecified, unspecified trimester: Secondary | ICD-10-CM

## 2022-05-10 DIAGNOSIS — O10913 Unspecified pre-existing hypertension complicating pregnancy, third trimester: Secondary | ICD-10-CM | POA: Insufficient documentation

## 2022-05-10 DIAGNOSIS — O99511 Diseases of the respiratory system complicating pregnancy, first trimester: Secondary | ICD-10-CM

## 2022-05-10 DIAGNOSIS — D563 Thalassemia minor: Secondary | ICD-10-CM

## 2022-05-10 DIAGNOSIS — Z6836 Body mass index (BMI) 36.0-36.9, adult: Secondary | ICD-10-CM | POA: Insufficient documentation

## 2022-05-10 DIAGNOSIS — Z3A28 28 weeks gestation of pregnancy: Secondary | ICD-10-CM

## 2022-05-10 DIAGNOSIS — O280 Abnormal hematological finding on antenatal screening of mother: Secondary | ICD-10-CM | POA: Diagnosis not present

## 2022-05-10 DIAGNOSIS — O10919 Unspecified pre-existing hypertension complicating pregnancy, unspecified trimester: Secondary | ICD-10-CM | POA: Insufficient documentation

## 2022-05-10 DIAGNOSIS — J45909 Unspecified asthma, uncomplicated: Secondary | ICD-10-CM | POA: Insufficient documentation

## 2022-05-10 DIAGNOSIS — O10013 Pre-existing essential hypertension complicating pregnancy, third trimester: Secondary | ICD-10-CM | POA: Diagnosis not present

## 2022-05-10 DIAGNOSIS — O99213 Obesity complicating pregnancy, third trimester: Secondary | ICD-10-CM | POA: Diagnosis not present

## 2022-05-10 DIAGNOSIS — R4589 Other symptoms and signs involving emotional state: Secondary | ICD-10-CM

## 2022-05-10 DIAGNOSIS — O210 Mild hyperemesis gravidarum: Secondary | ICD-10-CM | POA: Diagnosis not present

## 2022-05-10 DIAGNOSIS — O99210 Obesity complicating pregnancy, unspecified trimester: Secondary | ICD-10-CM | POA: Insufficient documentation

## 2022-05-10 DIAGNOSIS — Z5941 Food insecurity: Secondary | ICD-10-CM | POA: Insufficient documentation

## 2022-05-10 DIAGNOSIS — O09522 Supervision of elderly multigravida, second trimester: Secondary | ICD-10-CM

## 2022-05-10 DIAGNOSIS — O10912 Unspecified pre-existing hypertension complicating pregnancy, second trimester: Secondary | ICD-10-CM

## 2022-05-10 DIAGNOSIS — O285 Abnormal chromosomal and genetic finding on antenatal screening of mother: Secondary | ICD-10-CM

## 2022-05-10 HISTORY — DX: Unspecified asthma, uncomplicated: J45.909

## 2022-05-10 MED ORDER — NIFEDIPINE ER OSMOTIC RELEASE 30 MG PO TB24: 30.0000 mg | ORAL_TABLET | Freq: Every day | ORAL | 2 refills | Status: DC

## 2022-05-10 MED ORDER — ALBUTEROL SULFATE HFA 108 (90 BASE) MCG/ACT IN AERS: 2.0000 | INHALATION_SPRAY | Freq: Four times a day (QID) | RESPIRATORY_TRACT | 2 refills | Status: DC | PRN

## 2022-05-10 NOTE — Progress Notes (Signed)
PRENATAL VISIT NOTE  Subjective:  Susan Crawford is a 36 y.o. G5P3013 at [redacted]w[redacted]d being seen today for ongoing prenatal care.  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  Patient reports  see below .  Contractions: Irritability. Vag. Bleeding: None.  Movement: Present. Denies leaking of fluid.   The following portions of the patient's history were reviewed and updated as appropriate: allergies, current medications, past family history, past medical history, past social history, past surgical history and problem list.   Objective:   Vitals:   05/10/22 0857  BP: 121/86  Pulse: 95  Weight: 221 lb 9.6 oz (100.5 kg)    Fetal Status: Fetal Heart Rate (bpm): 144   Movement: Present     General:  Alert, oriented and cooperative. Patient is in no acute distress.  Skin: Skin is warm and dry. No rash noted.   Cardiovascular: Normal heart rate noted  Respiratory: Normal respiratory effort, no problems with respiration noted  Abdomen: Soft, gravid, appropriate for gestational age.  Pain/Pressure: Present     Pelvic: Cervical exam deferred        Extremities: Normal range of motion.  Edema: Trace  Mental Status: Normal mood and affect. Normal behavior. Normal judgment and thought content.   Assessment and Plan:  Pregnancy: G5P3013 at [redacted]w[redacted]d 1. Food insecurity - AMBULATORY REFERRAL TO [facility]  2. Tearfulness Due to tiredness and lack of sleep. Pt has trouble falling asleep and staying asleep. She lays down around 8-9pm and goes to sleep after about 30m but then wakes up in about an hour to void and then has trouble going back to sleep due  to this and having BH contractions. See patient instructions for interventions to try and help - Ambulatory referral to Integrated Behavioral Health  3. [redacted] weeks gestation of pregnancy 28wk labs today Ask about birth control next visit  4. BMI 36.0-36.9,adult Weight stable  5. Supervision of high risk pregnancy, antepartum  6. Chronic hypertension affecting pregnancy Doing well on labetalol. She only does once a day one 200mg  pill b/c it makes her sleepy. Will d/c the labetalol and switch to procardia xl 30 qday to see if this can also help with [facility] contractions Follow up growth today 4/12, 26%, 534g, ac 55%, afi wnl  7. AMA (advanced maternal age) multigravida 35+, third trimester  8. Asthma affecting pregnancy in first trimester Having some occasional SOB at work; she doesn't have an inhaler with  her h/o asthma. Could be pregnancy related. Will try albuterol and pt told if needing it more than about 3x/week then may need a maintenance INH  Preterm labor symptoms and general obstetric precautions including but not limited to vaginal bleeding, contractions, leaking of fluid and fetal movement were reviewed in detail with the patient. Please refer to After Visit Summary for other counseling recommendations.   Return in about 10 days (around 05/20/2022) for 10-14d , high risk ob, md visit, in person.  Future Appointments  Date Time Provider Department Center  05/10/2022  3:15 PM [facility] NURSE [facility]  05/10/2022  3:30 PM [facility] US2 [facility]  05/24/2022  2:55 PM 07/24/2022, MD [facility]  06/07/2022  8:55 AM 06/09/2022, PA-C [facility]  06/21/2022 10:55 AM Daviston Bing, MD [facility]    [place] Bing, MD

## 2022-05-10 NOTE — Patient Instructions (Addendum)
Things to help with sleep: -no screen time 45-60 minutes prior to bed -pregnancy pillow -warm bath -no fluid intake after dinner -buy a red or blue light to use if you have to get up during the night -as needed over the counter unisom  Keep track of how often you use the albuterol  Try eating six small meals a day

## 2022-05-11 ENCOUNTER — Other Ambulatory Visit: Payer: Self-pay | Admitting: *Deleted

## 2022-05-11 DIAGNOSIS — O10913 Unspecified pre-existing hypertension complicating pregnancy, third trimester: Secondary | ICD-10-CM

## 2022-05-11 LAB — HIV ANTIBODY (ROUTINE TESTING W REFLEX): HIV Screen 4th Generation wRfx: NONREACTIVE

## 2022-05-11 LAB — CBC
Hematocrit: 32.1 % — ABNORMAL LOW (ref 34.0–46.6)
Hemoglobin: 10.7 g/dL — ABNORMAL LOW (ref 11.1–15.9)
MCH: 27.5 pg (ref 26.6–33.0)
MCHC: 33.3 g/dL (ref 31.5–35.7)
MCV: 83 fL (ref 79–97)
Platelets: 263 10*3/uL (ref 150–450)
RBC: 3.89 x10E6/uL (ref 3.77–5.28)
RDW: 11.6 % — ABNORMAL LOW (ref 11.7–15.4)
WBC: 9.8 10*3/uL (ref 3.4–10.8)

## 2022-05-11 LAB — RPR: RPR Ser Ql: NONREACTIVE

## 2022-05-11 LAB — GLUCOSE TOLERANCE, 2 HOURS W/ 1HR
Glucose, 1 hour: 147 mg/dL (ref 70–179)
Glucose, 2 hour: 125 mg/dL (ref 70–152)
Glucose, Fasting: 91 mg/dL (ref 70–91)

## 2022-05-13 ENCOUNTER — Other Ambulatory Visit: Payer: Self-pay | Admitting: Obstetrics and Gynecology

## 2022-05-13 DIAGNOSIS — O0990 Supervision of high risk pregnancy, unspecified, unspecified trimester: Secondary | ICD-10-CM

## 2022-05-13 DIAGNOSIS — D563 Thalassemia minor: Secondary | ICD-10-CM

## 2022-05-13 DIAGNOSIS — O99013 Anemia complicating pregnancy, third trimester: Secondary | ICD-10-CM

## 2022-05-14 ENCOUNTER — Other Ambulatory Visit: Payer: Self-pay | Admitting: Family Medicine

## 2022-05-14 DIAGNOSIS — J302 Other seasonal allergic rhinitis: Secondary | ICD-10-CM

## 2022-05-14 NOTE — BH Specialist Note (Signed)
Integrated Behavioral Health via Telemedicine Visit  05/28/2022 Susan Crawford [id number]  Number of Integrated Behavioral Health Clinician visits: 1- Initial Visit  Session Start time: 1516   Session End time: 1534  Total time in minutes: 18   Referring Provider:  Bing, MD Patient/Family location: Home [facility] Provider location: [facility] for [facility]  All persons participating in visit: [name] and Susan Crawford   Types of Service: Individual psychotherapy and Video visit  I connected with Susan Crawford and/or Susan Crawford  n/a  via  Telephone or Video Enabled Telemedicine Application  (Video is Caregility application) and verified that I am speaking with the correct person using two identifiers. Discussed confidentiality: Yes   I discussed the limitations of telemedicine and the availability of in person appointments.  Discussed there is a possibility of technology failure and discussed alternative modes of communication if that failure occurs.  I discussed that engaging in this telemedicine visit, they consent to the provision of behavioral healthcare and the services will be billed under their insurance.  Patient and/or legal guardian expressed understanding and consented to Telemedicine visit: Yes   Presenting Concerns: Patient and/or family reports the following symptoms/concerns: Difficulty staying asleep with frequent urination at night; last night improved as only woke up twice. Pt was also concerned about having contractions that felt stronger than Braxton-Hicks, but became less and mild after drinking fluids and eating. Pt is requesting a new note for employer, specifying further what "light duty" means, in order for her to return to work.  Duration of problem: Current pregnancy; Severity of problem: mild  Patient and/or Family's Strengths/Protective Factors: Concrete supports in place (healthy  food, safe environments, etc.), Sense of purpose, and Physical Health (exercise, healthy diet, medication compliance, etc.)  Goals Addressed: Patient will:  Reduce symptoms of: stress   Increase knowledge and/or ability of: healthy habits   Demonstrate ability to: Increase motivation to adhere to plan of care  Progress towards Goals: Ongoing  Interventions: Interventions utilized:  Solution-Focused Strategies Standardized Assessments completed:  PHQ9/GAD7 given within past two weeks  Patient and/or Family Response: Patient agrees with treatment plan.   Assessment: Patient currently experiencing Other specified counseling.   Patient may benefit from psychoeducation and brief therapeutic interventions regarding coping with symptoms of current life stress .  Plan: Follow up with behavioral health clinician on : Call Susan Crawford at [phone], as needed. Behavioral recommendations:  -Continue taking prenatal vitamin as prescribed daily -Continue to prioritize sleep as healthy self-care (okay to wear Depends or other adult incontinence disposable product at night) -Read through Postpartum Planner on After Visit Summary  Referral(s): Integrated [employer] (In Clinic)  I discussed the assessment and treatment plan with the patient and/or parent/guardian. They were provided an opportunity to ask questions and all were answered. They agreed with the plan and demonstrated an understanding of the instructions.   They were advised to call back or seek an in-person evaluation if the symptoms worsen or if the condition fails to improve as anticipated.  Susan Close Shamarr Faucett, LCSW     05/24/2022    3:17 PM 05/10/2022    9:00 AM 04/19/2022    9:51 AM 03/15/2022    2:54 PM 01/04/2022    3:34 PM  Depression screen PHQ 2/9  Decreased Interest 0 0 3 0 0  Down, Depressed, Hopeless 0 0 0 0 0  PHQ - 2 Score 0 0 3 0 0  Altered sleeping 2 1 3  0 0  Tired, decreased energy 2 1 3  0 0  Change  in appetite 0 0 0 0 0  Feeling bad or failure about yourself  0 0 0 0 0  Trouble concentrating 0 0 0 0 0  Moving slowly or fidgety/restless 1 0 0 0 0  Suicidal thoughts 0 0 0 0 0  PHQ-9 Score 5 2 9  0 0  Difficult doing work/chores    Not difficult at all       05/24/2022    3:17 PM 05/10/2022    9:00 AM 04/19/2022    9:51 AM 03/15/2022    2:54 PM  GAD 7 : Generalized Anxiety Score  Nervous, Anxious, on Edge 0 0 0 0  Control/stop worrying 0 0 0 0  Worry too much - different things 0 1 0 0  Trouble relaxing 0 1 2 0  Restless 0 0 0 0  Easily annoyed or irritable 0 0 1 0  Afraid - awful might happen 0 0 0 0  Total GAD 7 Score 0 2 3 0  Anxiety Difficulty    Not difficult at all

## 2022-05-14 NOTE — Addendum Note (Signed)
Addended by: Georgia Lopes on: 05/14/2022 01:08 PM   Modules accepted: Orders

## 2022-05-16 LAB — ANEMIA PROFILE B
Ferritin: 40 ng/mL (ref 15–150)
Folate: >20 ng/mL (ref >3.0)
Iron Saturation: 15 % (ref 15–55)
Iron: 57 ug/dL (ref 27–159)
Total Iron Binding Capacity: 373 ug/dL (ref 250–450)
UIBC: 316 ug/dL (ref 131–425)
Vitamin B-12: 338 pg/mL (ref 232–1245)

## 2022-05-16 LAB — SPECIMEN STATUS REPORT

## 2022-05-24 ENCOUNTER — Ambulatory Visit (INDEPENDENT_AMBULATORY_CARE_PROVIDER_SITE_OTHER): Payer: Medicaid Other | Admitting: Family Medicine

## 2022-05-24 VITALS — BP 131/84 | HR 91 | Wt 223.0 lb

## 2022-05-24 DIAGNOSIS — O10919 Unspecified pre-existing hypertension complicating pregnancy, unspecified trimester: Secondary | ICD-10-CM

## 2022-05-24 DIAGNOSIS — O09523 Supervision of elderly multigravida, third trimester: Secondary | ICD-10-CM

## 2022-05-24 DIAGNOSIS — O0990 Supervision of high risk pregnancy, unspecified, unspecified trimester: Secondary | ICD-10-CM

## 2022-05-24 NOTE — Progress Notes (Signed)
   PRENATAL VISIT NOTE  Subjective:  Susan Crawford is a 36 y.o. [phone] at [redacted]w[redacted]d being seen today for ongoing prenatal care.  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  Patient reports no complaints.  Contractions: Irritability. Vag. Bleeding: None.  Movement: Present. Denies leaking of fluid.   The following portions of the patient's history were reviewed and updated as appropriate: allergies, current medications, past family history, past medical history, past social history, past surgical history and problem list.   Objective:   Vitals:   05/24/22 1506  BP: 131/84  Pulse: 91  Weight: 223 lb (101.2 kg)    Fetal Status: Fetal Heart Rate (bpm): 137 Fundal Height: 30 cm Movement: Present     General:  Alert, oriented and cooperative. Patient is in no acute distress.  Skin: Skin is warm and dry. No rash noted.   Cardiovascular: Normal heart rate noted  Respiratory: Normal respiratory effort, no problems with respiration noted  Abdomen: Soft, gravid, appropriate for gestational age.  Pain/Pressure: Present     Pelvic: Cervical exam deferred        Extremities: Normal range of motion.  Edema: Trace  Mental Status: Normal mood and affect. Normal behavior. Normal judgment and thought content.   Assessment and Plan:  Pregnancy: [id number] at [redacted]w[redacted]d 1. Chronic hypertension affecting pregnancy On Procardia 30 mg daily ASA daily  2. Supervision of high risk pregnancy, antepartum Continue prenatal care. Light duty note given   3. AMA (advanced maternal age) multigravida  11+, third trimester LR NIPT  Preterm labor symptoms and general obstetric precautions including but not limited to vaginal bleeding, contractions, leaking of fluid and fetal movement were reviewed in detail with the patient. Please refer to After Visit Summary for other counseling recommendations.   Return in 2 weeks (on 06/07/2022) for [facility], OB visit and BPP in 3 weeks.  Future Appointments  Date Time Provider [place]  05/28/2022  3:15 PM [place] [facility]  06/07/2022  8:55 AM Danielle Rankin [facility]  06/08/2022  8:30 AM [facility] NURSE [facility]  06/08/2022  8:45 AM [facility] US5 [facility]  06/14/2022  3:15 PM [facility] NURSE [facility]  06/14/2022  3:30 PM [facility] US3 [facility]  06/21/2022 10:55 AM Aletha Halim, MD [facility]  07/05/2022  1:35 PM Renard Matter, MD [facility]  07/12/2022  9:15 AM Renard Matter, MD [facility]  07/19/2022 10:55 AM Caren Macadam, MD [facility]    Donnamae Jude, MD

## 2022-05-28 ENCOUNTER — Other Ambulatory Visit: Payer: Self-pay

## 2022-05-28 ENCOUNTER — Ambulatory Visit: Payer: Medicaid Other | Admitting: Clinical

## 2022-05-28 DIAGNOSIS — Z7189 Other specified counseling: Secondary | ICD-10-CM

## 2022-05-28 NOTE — Progress Notes (Signed)
Call placed to pt. Pt needing specific note for pregnancy work restrictions. Pt gave this RN details and were placed in letter to pt. Pt aware of letter in [place] and will pick up copy at front office.  Laney Pastor

## 2022-05-28 NOTE — Patient Instructions (Signed)
[facility] [address] [place], [place] [address] [phone] (main office) [phone] (Atharv Barriere's office)     BRAINSTORMING  Develop a Plan Goals: Provide a way to start conversation about your new life with a baby Assist parents in recognizing and using resources within their reach Help pave the way before birth for an easier period of transition afterwards.  Make a list of the following information to keep in a central location: Full name of Mom and Partner: _____________________________________________ Baby's full name and Date of Birth: ___________________________________________ Home Address: ___________________________________________________________ ________________________________________________________________________ Home Phone: ____________________________________________________________ Parents' cell numbers: _____________________________________________________ ________________________________________________________________________ Name and contact info for OB: ______________________________________________ Name and contact info for Pediatrician:________________________________________ Contact info for Lactation Consultants: ________________________________________  REST and SLEEP *You each need at least 4-5 hours of uninterrupted sleep every day. Write specific names and contact information.* How are you going to rest in the postpartum period? While partner's home? When partner returns to work? When you both return to work? Where will your baby sleep? Who is available to help during the day? Evening? Night? Who could move in for a period to help support you? What are some ideas to help you get enough  sleep? __________________________________________________________________________________________________________________________________________________________________________________________________________________________________________ NUTRITIOUS FOOD AND DRINK *Plan for meals before your baby is born so you can have healthy food to eat during the immediate postpartum period.* Who will look after breakfast? Lunch? Dinner? List names and contact information. Brainstorm quick, healthy ideas for each meal. What can you do before baby is born to prepare meals for the postpartum period? How can others help you with meals? Which grocery stores provide online shopping and delivery? Which restaurants offer take-out or delivery options? ______________________________________________________________________________________________________________________________________________________________________________________________________________________________________________________________________________________________________________________________________________________________________________________________________  CARE FOR MOM *It's important that mom is cared for and pampered in the postpartum period. Remember, the most important ways new mothers need care are: sleep, nutrition, gentle exercise, and time off.* Who can come take care of mom during this period? Make a list of people with their contact information. List some activities that make you feel cared for, rested, and energized? Who can make sure you have opportunities to do these things? Does mom have a space of her very own within your home that's just for her? Make a "Mama Cave" where she can be comfortable, rest, and renew herself  daily. ______________________________________________________________________________________________________________________________________________________________________________________________________________________________________________________________________________________________________________________________________________________________________________________________________    CARE FOR AND FEEDING BABY *Knowledgeable and encouraging people will offer the best support with regard to feeding your baby.* Educate yourself and choose the best feeding option for your baby. Make a list of people who will guide, support, and be a resource for you as your care for and feed your baby. (Friends that have breastfed or are currently breastfeeding, lactation consultants, breastfeeding support groups, etc.) Consider a postpartum doula. (These websites can give you information: dona.org & padanc.org) Seek out local breastfeeding resources like the breastfeeding support group at [facility] or [employer]. ______________________________________________________________________________________________________________________________________________________________________________________________________________________________________________________________________________________________________________________________________________________________________________________________________  CHORES AND ERRANDS Who can help with a thorough cleaning before baby is born? Make a list of people who will help with housekeeping and chores, like laundry, light cleaning, dishes, bathrooms, etc. Who can run some errands for you? What can you do to make sure you are stocked with basic supplies before baby is born? Who is going to do the  shopping? ______________________________________________________________________________________________________________________________________________________________________________________________________________________________________________________________________________________________________________________________________________________________________________________________________     Family Adjustment *Nurture yourselves.it helps parents be more loving and allows for better bonding with their child.* What sorts of things do you and partner enjoy   doing together? Which activities help you to connect and strengthen your relationship? Make a list of those things. Make a list of people whom you trust to care for your baby so you can have some time together as a couple. What types of things help partner feel connected to Mom? Make a list. What needs will partner have in order to bond with baby? Other children? Who will care for them when you go into labor and while you are in the hospital? Think about what the needs of your older children might be. Who can help you meet those needs? In what ways are you helping them prepare for bringing baby home? List some specific strategies you have for family adjustment. _______________________________________________________________________________________________________________________________________________________________________________________________________________________________________________________________________________________________________________________________________________  SUPPORT *Someone who can empathize with experiences normalizes your problems and makes them more bearable.* Make a list of other friends, neighbors, and/or co-workers you know with infants (and small children, if applicable) with whom you can connect. Make a list of local or online support groups, mom groups, etc. in which you can be  involved. ______________________________________________________________________________________________________________________________________________________________________________________________________________________________________________________________________________________________________________________________________________________________________________________________________  Childcare Plans Investigate and plan for childcare if mom is returning to work. Talk about mom's concerns about her transition back to work. Talk about partner's concerns regarding this transition.  Mental Health *Your mental health is one of the highest priorities for a pregnant or postpartum mom.* 1 in 5 women experience anxiety and/or depression from the time of conception through the first year after birth. Postpartum Mood Disorders are the #1 complication of pregnancy and childbirth and the suffering experienced by these mothers is not necessary! These illnesses are temporary and respond well to treatment, which often includes self-care, social support, talk therapy, and medication when needed. Women experiencing anxiety and depression often say things like: "I'm supposed to be happy.why do I feel so sad?", "Why can't I snap out of it?", "I'm having thoughts that scare me." There is no need to be embarrassed if you are feeling these symptoms: Overwhelmed, anxious, angry, sad, guilty, irritable, hopeless, exhausted but can't sleep You are NOT alone. You are NOT to blame. With help, you WILL be well. Where can I find help? Medical professionals such as your OB, midwife, gynecologist, family practitioner, primary care provider, pediatrician, or mental health providers; [facility] support groups: Feelings After Birth, Breastfeeding Support Group, Baby and Me Group, and Fit 4 Two exercise classes. You have permission to ask for help. It will confirm your feelings, validate your experiences,  share/learn coping strategies, and gain support and encouragement as you heal. You are important! BRAINSTORM Make a list of local resources, including resources for mom and for partner. Identify support groups. Identify people to call late at night - include names and contact info. Talk with partner about perinatal mood and anxiety disorders. Talk with your OB, midwife, and doula about baby blues and about perinatal mood and anxiety disorders. Talk with your pediatrician about perinatal mood and anxiety disorders.   Support & Sanity Savers   What do you really need?  Basics In preparing for a new baby, many expectant parents spend hours shopping for baby clothes, decorating the nursery, and deciding which car seat to buy. Yet most don't think much about what the reality of parenting a newborn will be like, and what they need to make it through that. So, here is the advice of experienced parents. We know you'll read this, and think "they're exaggerating, I don't really need that." Just trust us on these, OK? Plan for all of   this, and if it turns out you don't need it, come back and teach us how you did it!  Must-Haves (Once baby's survival needs are met, make sure you attend to your own survival needs!) Sleep An average newborn sleeps 16-18 hours per day, over 6-7 sleep periods, rarely more than three hours at a time. It is normal and healthy for a newborn to wake throughout the night... but really hard on parents!! Naps. Prioritize sleep above any responsibilities like: cleaning house, visiting friends, running errands, etc.  Sleep whenever baby sleeps. If you can't nap, at least have restful times when baby eats. The more rest you get, the more patient you will be, the more emotionally stable, and better at solving problems.  Food You may not have realized it would be difficult to eat when you have a newborn. Yet, when we talk to countless new parents, they say things like "it may be 2:00 pm  when I realize I haven't had breakfast yet." Or "every time we sit down to dinner, baby needs to eat, and my food gets cold, so I don't bother to eat it." Finger food. Before your baby is born, stock up with one months' worth of food that: 1) you can eat with one hand while holding a baby, 2) doesn't need to be prepped, 3) is good hot or cold, 4) doesn't spoil when left out for a few hours, and 5) you like to eat. Think about: nuts, dried fruit, Clif bars, pretzels, jerky, gogurt, baby carrots, apples, bananas, crackers, cheez-n-crackers, string cheese, hot pockets or frozen burritos to microwave, garden burgers and breakfast pastries to put in the toaster, yogurt drinks, etc. Restaurant Menus. Make lists of your favorite restaurants & menu items. When family/friends want to help, you can give specific information without much thought. They can either bring you the food or send gift cards for just the right meals. Freezer Meals.  Take some time to make a few meals to put in the freezer ahead of time.  Easy to freeze meals can be anything such as soup, lasagna, chicken pie, or spaghetti sauce. Set up a Meal Schedule.  Ask friends and family to sign up to bring you meals during the first few weeks of being home. (It can be passed around at baby showers!) You have no idea how helpful this will be until you are in the throes of parenting.  www.takethemameal.com is a great website to check out. Emotional Support Know who to call when you're stressed out. Parenting a newborn is very challenging work. There are times when it totally overwhelms your normal coping abilities. EVERY NEW PARENT NEEDS TO HAVE A PLAN FOR WHO TO CALL WHEN THEY JUST CAN'T COPE ANY MORE. (And it has to be someone other than the baby's other parent!) Before your baby is born, come up with at least one person you can call for support - write their phone number down and post it on the refrigerator. Anxiety & Sadness. Baby blues are normal after  pregnancy; however, there are more severe types of anxiety & sadness which can occur and should not be ignored.  They are always treatable, but you have to take the first step by reaching out for help. [facility] offers a "Mom Talk" group which meets every Tuesday from 10 am - 11 am.  This group is for new moms who need support and connection after their babies are born.  Call [phone].  Really, Really Helpful (Plan for them!   Make sure these happen often!!) Physical Support with Taking Care of Yourselves Asking friends and family. Before your baby is born, set up a schedule of people who can come and visit and help out (or ask a friend to schedule for you). Any time someone says "let me know what I can do to help," sign them up for a day. When they get there, their job is not to take care of the baby (that's your job and your joy). Their job is to take care of you!  Postpartum doulas. If you don't have anyone you can call on for support, look into postpartum doulas:  professionals at helping parents with caring for baby, caring for themselves, getting breastfeeding started, and helping with household tasks. www.padanc.org is a helpful website for learning about doulas in our area. Peer Support / Parent Groups Why: One of the greatest ideas for new parents is to be around other new parents. Parent groups give you a chance to share and listen to others who are going through the same season of life, get a sense of what is normal infant development by watching several babies learn and grow, share your stories of triumph and struggles with empathetic ears, and forgive your own mistakes when you realize all parents are learning by trial and error. Where to find: There are many places you can meet other new parents throughout our community.  [facility] offers the following classes for new moms and their little ones:  Baby and Me (Birth to Crawling) and Breastfeeding Support Group. Go to  www.conehealthybaby.com or call [phone] for more information. Time for your Relationship It's easy to get so caught up in meeting baby's immediate needs that it's hard to find time to connect with your partner, and meet the needs of your relationship. It's also easy to forget what "quality time with your partner" actually looks like. If you take your baby on a date, you'd be amazed how much of your couple time is spent feeding the baby, diapering the baby, admiring the baby, and talking about the baby. Dating: Try to take time for just the two of you. Babysitter tip: Sometimes when moms are breastfeeding a newborn, they find it hard to figure out how to schedule outings around baby's unpredictable feeding schedules. Have the babysitter come for a three hour period. When she comes over, if baby has just eaten, you can leave right away, and come back in two hours. If baby hasn't fed recently, you start the date at home. Once baby gets hungry and gets a good feeding in, you can head out for the rest of your date time. Date Nights at Home: If you can't get out, at least set aside one evening a week to prioritize your relationship: whenever baby dozes off or doesn't have any immediate needs, spend a little time focusing on each other. Potential conflicts: The main relationship conflicts that come up for new parents are: issues related to sexuality, financial stresses, a feeling of an unfair division of household tasks, and conflicts in parenting styles. The more you can work on these issues before baby arrives, the better!  Fun and Frills (Don't forget these. and don't feel guilty for indulging in them!) Everyone has something in life that is a fun little treat that they do just for themselves. It may be: reading the morning paper, or going for a daily jog, or having coffee with a friend once a week, or going to a movie on Friday nights,   or fine chocolates, or bubble baths, or curling up with a good  book. Unless you do fun things for yourself every now and then, it's hard to have the energy for fun with your baby. Whatever your "special" treats are, make sure you find a way to continue to indulge in them after your baby is born. These special moments can recharge you, and allow you to return to baby with a new joy   PERINATAL MOOD DISORDERS: [place]   _________________________________________Emergency and Crisis Resources If you are an imminent risk to self or others, are experiencing intense personal distress, and/or have noticed significant changes in activities of daily living, call:  [facility]: [phone]  [address] [place], [place], [address] Mobile Crisis: [place]: 988 Or visit the following crisis centers: Local Emergency Departments [facility]: [address], [place]. Hours: 8:30AM-5PM. Insurance Accepted: Medicaid, Medicare, and Uninsured.  [facility]:  [address], [place]  Mon-Friday 8am-3pm, [phone]                                                                                  ___________ Non-Crisis Resources To identify specific providers that are covered by your insurance, contact your insurance company or local agencies:  [place]: [phone] [facility] [facility] [employer]: [place]: [phone] Postpartum Support International- Warm-line: (239)374-1098                                                      __Outpatient Therapy and Medication Management   Providers:  [facility]: [phone] Hours: 9AM-5PM  Insurance Accepted: Alben Spittle, Shane Crutch, [place], [place] [facility]): [phone] Hours: 8AM-5:30PM  nsurance Accepted: All insurances EXCEPT AARP, [place],  [place], and [place]: [phone] Hours: 8AM-8PM Insurance Accepted: Cristal Ford, Freddrick March, [place], Medicare, [phone] [facility]: [phone] Hours: 8:30AM-7PM Insurance Accepted: Cristal Ford, Medicaid, Medicare, Tricare, [employer] Counseling:  [place] Accepted:  Holland Falling, Lorella Nimrod, [employer], [place]: [phone] Hours: 9AM-5:30PM Insurance Accepted: Alben Spittle, Charlotte Crumb, and Medicaid, Medicare, [facility]:  [phone] Hours: 9am-5pm Insurance Accepted: BCBS; they do not accept Medicaid/Medicare [facility] [place]: [phone] Hours: 9am-9pm Insurance Accepted: All major insurance including Medicaid and Medicare [facility]: [phone] Hours: [place] Accepted: All insurances EXCEPT Medicaid and Medicare. [facility]: [phone]   ____________  Parenting Support Groups [facility]: [phone] [facility]:  [phone] Family Support Network: (support for children in the NICU and/or with special needs), [phone]   ___________                                                                 Mental Health Support Groups Mental Health Association: [phone]    _____________                                                                                  Online Resources Postpartum Support International: http://www.postpartum.net/  800-944-4PPD 2Moms Supporting Moms:  www.momssupportingmoms.net    

## 2022-05-29 ENCOUNTER — Encounter: Payer: Self-pay | Admitting: *Deleted

## 2022-06-06 ENCOUNTER — Encounter (HOSPITAL_COMMUNITY): Payer: Self-pay | Admitting: Obstetrics & Gynecology

## 2022-06-06 ENCOUNTER — Other Ambulatory Visit: Payer: Self-pay

## 2022-06-06 ENCOUNTER — Inpatient Hospital Stay (HOSPITAL_COMMUNITY)
Admission: AD | Admit: 2022-06-06 | Discharge: 2022-06-06 | Disposition: A | Discharge status: Home / Self Care | Payer: Medicaid Other | Attending: Obstetrics & Gynecology | Admitting: Obstetrics & Gynecology

## 2022-06-06 DIAGNOSIS — Z79899 Other long term (current) drug therapy: Secondary | ICD-10-CM | POA: Insufficient documentation

## 2022-06-06 DIAGNOSIS — M25552 Pain in left hip: Secondary | ICD-10-CM | POA: Diagnosis present

## 2022-06-06 DIAGNOSIS — M25559 Pain in unspecified hip: Secondary | ICD-10-CM | POA: Diagnosis not present

## 2022-06-06 DIAGNOSIS — Z7982 Long term (current) use of aspirin: Secondary | ICD-10-CM | POA: Diagnosis not present

## 2022-06-06 DIAGNOSIS — Z3A32 32 weeks gestation of pregnancy: Secondary | ICD-10-CM | POA: Insufficient documentation

## 2022-06-06 DIAGNOSIS — O09523 Supervision of elderly multigravida, third trimester: Secondary | ICD-10-CM | POA: Diagnosis not present

## 2022-06-06 DIAGNOSIS — O26893 Other specified pregnancy related conditions, third trimester: Secondary | ICD-10-CM | POA: Diagnosis not present

## 2022-06-06 DIAGNOSIS — O99891 Other specified diseases and conditions complicating pregnancy: Secondary | ICD-10-CM | POA: Insufficient documentation

## 2022-06-06 LAB — URINALYSIS, ROUTINE W REFLEX MICROSCOPIC
Bilirubin Urine: NEGATIVE
Glucose, UA: NEGATIVE mg/dL
Hgb urine dipstick: NEGATIVE
Ketones, ur: NEGATIVE mg/dL
Leukocytes,Ua: NEGATIVE
Nitrite: NEGATIVE
Protein, ur: NEGATIVE mg/dL
Specific Gravity, Urine: 1.01 (ref 1.005–1.030)
pH: 7 (ref 5.0–8.0)

## 2022-06-06 MED ORDER — CYCLOBENZAPRINE HCL 5 MG PO TABS
10.0000 mg | ORAL_TABLET | Freq: Once | ORAL | Status: AC
Start: 2022-06-06 — End: 2022-06-06
  Administered 2022-06-06: 10 mg via ORAL
  Filled 2022-06-06: qty 2

## 2022-06-06 MED ORDER — CYCLOBENZAPRINE HCL 10 MG PO TABS: 10.0000 mg | ORAL_TABLET | Freq: Two times a day (BID) | ORAL | 0 refills | Status: DC | PRN

## 2022-06-06 NOTE — MAU Provider Note (Signed)
History     CSN: [id number]  Arrival date and time: 06/06/22 1037   Event Date/Time   First Provider Initiated Contact with Patient 06/06/22 1120      Chief Complaint  Patient presents with   Hip Pain   HPI  Susan Crawford is a 36 y.o. [id number] at [redacted]w[redacted]d who presents for evaluation of left hip pain. Patient reports she has chronic hip pain but today she went to work and it got much worse. She reports she is not able to take but a couple steps at a time because of the pain. Patient rates the pain as a 10/10 and has tried tylenol for the pain with no relief. She denies any contractions. She denies any vaginal bleeding, discharge, and leaking of fluid. Denies any constipation, diarrhea or any urinary complaints. Reports normal fetal movement.  OB History     Gravida  5   Para  3   Term  3   Preterm  0   AB  1   Living  3      SAB  1   IAB      Ectopic      Multiple      Live Births  3           Past Medical History:  Diagnosis Date   Asthma    Hypertension    'goes crazy with pregnancies"   Kidney stone 2014   Preterm labor     Past Surgical History:  Procedure Laterality Date   BREAST LUMPECTOMY      Family History  Problem Relation Age of Onset   Asthma Mother    Hypertension Mother    Hypertension Maternal Grandmother    Asthma Maternal Grandmother    Other Neg Hx     Social History   Tobacco Use   Smoking status: Never   Smokeless tobacco: Never  Vaping Use   Vaping Use: Never used  Substance Use Topics   Alcohol use: No    Comment: rare   Drug use: No    Allergies: No Known Allergies  Medications Prior to Admission  Medication Sig Dispense Refill Last Dose   albuterol (VENTOLIN HFA) 108 (90 Base) MCG/ACT inhaler Inhale 2 puffs into the lungs every 6 (six) hours as needed for wheezing or shortness of breath. 8 g 2 06/05/2022   aspirin EC 81 MG tablet Take 1 tablet (81 mg total) by mouth daily. Take after 12 weeks for prevention  of preeclampsia later in pregnancy 300 tablet 2 06/06/2022   loratadine (CLARITIN) 10 MG tablet Take 1 tablet (10 mg total) by mouth daily. 30 tablet 1 06/04/2022   NIFEdipine (PROCARDIA XL) 30 MG 24 hr tablet Take 1 tablet (30 mg total) by mouth daily. 30 tablet 2 06/05/2022   pantoprazole (PROTONIX) 40 MG tablet Take 1 tablet (40 mg total) by mouth daily. 30 tablet 3 06/06/2022   Prenatal Vit-Fe Fumarate-FA (PREPLUS) 27-1 MG TABS Take 1 tablet by mouth daily. 30 tablet 13 06/05/2022   promethazine (PHENERGAN) 25 MG suppository Place 1 suppository (25 mg total) rectally every 6 (six) hours as needed for nausea, vomiting or refractory nausea / vomiting. 30 suppository 1 06/05/2022   acetaminophen (TYLENOL) 500 MG tablet Take 2 tablets (1,000 mg total) by mouth every 6 (six) hours as needed. 60 tablet 0 More than a month   Blood Pressure Monitoring DEVI 1 each by Does not apply route once a week. (Patient not taking: Reported on  03/07/2022) 1 each 0     Review of Systems  Constitutional: Negative.  Negative for fatigue and fever.  HENT: Negative.    Respiratory: Negative.  Negative for shortness of breath.   Cardiovascular: Negative.  Negative for chest pain.  Gastrointestinal: Negative.  Negative for abdominal pain, constipation, diarrhea, nausea and vomiting.  Genitourinary: Negative.  Negative for dysuria, vaginal bleeding and vaginal discharge.  Musculoskeletal:        Hip pain  Neurological: Negative.  Negative for dizziness and headaches.   Physical Exam   Blood pressure 123/83, pulse 91, temperature 98.5 F (36.9 C), temperature source Oral, resp. rate 18, height 5\' 5"  (1.651 m), weight 100.4 kg, last menstrual period 10/24/2021, SpO2 100 %.  Patient Vitals for the past 24 hrs:  BP Temp Temp src Pulse Resp SpO2 Height Weight  06/06/22 1053 123/83 98.5 F (36.9 C) Oral 91 18 100 % -- --  06/06/22 1049 -- -- -- -- -- -- 5\' 5"  (1.651 m) 100.4 kg    Physical Exam Vitals and nursing note  reviewed.  Constitutional:      General: She is not in acute distress.    Appearance: She is well-developed.  HENT:     Head: Normocephalic.  Eyes:     Pupils: Pupils are equal, round, and reactive to light.  Cardiovascular:     Rate and Rhythm: Normal rate and regular rhythm.     Heart sounds: Normal heart sounds.  Pulmonary:     Effort: Pulmonary effort is normal. No respiratory distress.     Breath sounds: Normal breath sounds.  Abdominal:     General: Bowel sounds are normal. There is no distension.     Palpations: Abdomen is soft.     Tenderness: There is no abdominal tenderness.  Skin:    General: Skin is warm and dry.  Neurological:     Mental Status: She is alert and oriented to person, place, and time.  Psychiatric:        Mood and Affect: Mood normal.        Behavior: Behavior normal.        Thought Content: Thought content normal.        Judgment: Judgment normal.    Fetal Tracing:  Baseline: 130 Variability: moderate Accels: 15x15 Decels: none  Toco: UI  MAU Course  Procedures  Results for orders placed or performed during the hospital encounter of 06/06/22 (from the past 24 hour(s))  Urinalysis, Routine w reflex microscopic Urine, Clean Catch     Status: None   Collection Time: 06/06/22 11:07 AM  Result Value Ref Range   Color, Urine YELLOW YELLOW   APPearance CLEAR CLEAR   Specific Gravity, Urine 1.010 1.005 - 1.030   pH 7.0 5.0 - 8.0   Glucose, UA NEGATIVE NEGATIVE mg/dL   Hgb urine dipstick NEGATIVE NEGATIVE   Bilirubin Urine NEGATIVE NEGATIVE   Ketones, ur NEGATIVE NEGATIVE mg/dL   Protein, ur NEGATIVE NEGATIVE mg/dL   Nitrite NEGATIVE NEGATIVE   Leukocytes,Ua NEGATIVE NEGATIVE    MDM Labs ordered and reviewed.   UA Flexeril PO- patient reports resolution of pain Cervix closed/thick/posterior  Assessment and Plan   1. Pregnancy related hip pain in third trimester, antepartum   2. [redacted] weeks gestation of pregnancy     -Discharge home  in stable condition -Rx for flexeril sent to pharmacy -Preterm labor precautions discussed -Patient advised to follow-up with OB as scheduled for prenatal care -Patient may return to MAU as  needed or if her condition were to change or worsen  Wende Mott, CNM 06/06/2022, 11:21 AM

## 2022-06-06 NOTE — Discharge Instructions (Signed)

## 2022-06-06 NOTE — MAU Note (Signed)
Susan Crawford is a 36 y.o. at [redacted]w[redacted]d here in MAU reporting: bilateral hip pain since last night. Pain is constant. No bleeding or LOF. +FM  Onset of complaint: last night  Pain score: 9/10  Vitals:   06/06/22 1053  BP: 123/83  Pulse: 91  Resp: 18  Temp: 98.5 F (36.9 C)  SpO2: 100%     FHT:145  Lab orders placed from triage: UA

## 2022-06-07 ENCOUNTER — Encounter: Payer: Self-pay | Admitting: Medical

## 2022-06-07 ENCOUNTER — Telehealth (INDEPENDENT_AMBULATORY_CARE_PROVIDER_SITE_OTHER): Payer: Medicaid Other | Admitting: Medical

## 2022-06-07 DIAGNOSIS — O99213 Obesity complicating pregnancy, third trimester: Secondary | ICD-10-CM

## 2022-06-07 DIAGNOSIS — O0990 Supervision of high risk pregnancy, unspecified, unspecified trimester: Secondary | ICD-10-CM

## 2022-06-07 DIAGNOSIS — Z3A32 32 weeks gestation of pregnancy: Secondary | ICD-10-CM

## 2022-06-07 DIAGNOSIS — O0943 Supervision of pregnancy with grand multiparity, third trimester: Secondary | ICD-10-CM

## 2022-06-07 DIAGNOSIS — O99210 Obesity complicating pregnancy, unspecified trimester: Secondary | ICD-10-CM

## 2022-06-07 DIAGNOSIS — O09523 Supervision of elderly multigravida, third trimester: Secondary | ICD-10-CM

## 2022-06-07 DIAGNOSIS — J45909 Unspecified asthma, uncomplicated: Secondary | ICD-10-CM

## 2022-06-07 DIAGNOSIS — Z6836 Body mass index (BMI) 36.0-36.9, adult: Secondary | ICD-10-CM

## 2022-06-07 DIAGNOSIS — O10913 Unspecified pre-existing hypertension complicating pregnancy, third trimester: Secondary | ICD-10-CM

## 2022-06-07 DIAGNOSIS — O99513 Diseases of the respiratory system complicating pregnancy, third trimester: Secondary | ICD-10-CM

## 2022-06-07 DIAGNOSIS — O10919 Unspecified pre-existing hypertension complicating pregnancy, unspecified trimester: Secondary | ICD-10-CM

## 2022-06-07 DIAGNOSIS — Z148 Genetic carrier of other disease: Secondary | ICD-10-CM

## 2022-06-07 DIAGNOSIS — D563 Thalassemia minor: Secondary | ICD-10-CM

## 2022-06-07 NOTE — Progress Notes (Signed)
I connected with Susan Crawford 06/07/22 at  8:55 AM EDT by: MyChart video and verified that I am speaking with the correct person using two identifiers.  Patient is located at home and provider is located at [facility].     I discussed the limitations, risks, security and privacy concerns of performing an evaluation and management service by MyChart video and the availability of in person appointments. I also discussed with the patient that there may be a patient responsible charge related to this service. By engaging in this virtual visit, you consent to the provision of healthcare.  Additionally, you authorize for your insurance to be billed for the services provided during this visit.  The patient expressed understanding and agreed to proceed.  The following staff members participated in the virtual visit:  Corinda Gubler, CMA    PRENATAL VISIT NOTE  Subjective:  Susan Crawford is a 36 y.o. [id number] at [redacted]w[redacted]d  for virtual visit for ongoing prenatal care.  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  Patient reports backache and occasional contractions.  Contractions: Irregular. Vag. Bleeding: None.  Movement: Present. Denies leaking of fluid.   The following portions of the patient's history were reviewed and updated as appropriate: allergies, current medications, past family history, past medical history, past social history, past surgical history and problem list.   Objective:  There were no vitals filed for this visit. Self-Obtained  Fetal Status:     Movement: Present      Assessment and Plan:  Pregnancy: G5P3013 at [redacted]w[redacted]d 1. Chronic hypertension affecting pregnancy - Unable to take BP today, denies HA or vision changes today  - Continue Procardia and ASA  - Will get BP cuff in office next visit  2. Supervision of high risk pregnancy, antepartum - Occasional contractions, multiple times per day - In MAU yesterday, UI noted on TOCO, no contractions, cervix was closed, given Flexeril  - Patient advised she may need to get a better maternity belt   3. 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 Alpha thalassemia silent carrier  5. AMA (advanced maternal age) multigravida 35+, third trimester  6. Asthma affecting pregnancy in first trimester - Using inhaler multiple times per day  7. Obesity in pregnancy  8. [redacted] weeks gestation of pregnancy   Preterm labor symptoms and general obstetric precautions including but not limited to vaginal bleeding, contractions, leaking of fluid and fetal movement were reviewed in detail with the patient.  Return in about 2 weeks (around 06/21/2022) for [facility] APP, In-Person.  Future Appointments  Date Time Provider Department Center  06/08/2022  8:30 AM [facility] NURSE [facility]  06/08/2022  8:45 AM [facility] US5 [facility]  06/14/2022  3:15 PM [facility] NURSE [facility]  06/14/2022  3:30 PM [facility] US3 [facility]  06/21/2022 10:55 AM [place] Bing, MD [facility]  07/05/2022  1:35 PM Federico Flake, MD [facility]  07/12/2022  9:15 AM Warner Mccreedy, MD [facility]  07/19/2022 10:55 AM Federico Flake, MD [facility]     Time spent on virtual visit: 15 minutes  Vonzella Nipple, PA-C

## 2022-06-07 NOTE — Progress Notes (Signed)
I connected with  Susan Crawford on 06/07/22 at  8:55 AM EDT by MyChart Virtual Video Visit and verified that I am speaking with the correct person using two identifiers.   I discussed the limitations, risks, security and privacy concerns of performing an evaluation and management service by telephone and the availability of in person appointments. I also discussed with the patient that there may be a patient responsible charge related to this service. The patient expressed understanding and agreed to proceed.  Guy Begin, CMA 06/07/2022  8:42 AM

## 2022-06-08 ENCOUNTER — Encounter: Payer: Self-pay | Admitting: *Deleted

## 2022-06-08 ENCOUNTER — Ambulatory Visit: Payer: Medicaid Other | Admitting: *Deleted

## 2022-06-08 ENCOUNTER — Ambulatory Visit: Payer: Medicaid Other | Attending: Obstetrics

## 2022-06-08 ENCOUNTER — Other Ambulatory Visit: Payer: Self-pay | Admitting: *Deleted

## 2022-06-08 ENCOUNTER — Ambulatory Visit: Payer: Medicaid Other

## 2022-06-08 VITALS — BP 129/80 | HR 101

## 2022-06-08 DIAGNOSIS — Z3A32 32 weeks gestation of pregnancy: Secondary | ICD-10-CM

## 2022-06-08 DIAGNOSIS — O0990 Supervision of high risk pregnancy, unspecified, unspecified trimester: Secondary | ICD-10-CM | POA: Diagnosis present

## 2022-06-08 DIAGNOSIS — O10919 Unspecified pre-existing hypertension complicating pregnancy, unspecified trimester: Secondary | ICD-10-CM

## 2022-06-08 DIAGNOSIS — O10913 Unspecified pre-existing hypertension complicating pregnancy, third trimester: Secondary | ICD-10-CM

## 2022-06-08 DIAGNOSIS — O10013 Pre-existing essential hypertension complicating pregnancy, third trimester: Secondary | ICD-10-CM | POA: Diagnosis not present

## 2022-06-08 DIAGNOSIS — Z362 Encounter for other antenatal screening follow-up: Secondary | ICD-10-CM

## 2022-06-08 DIAGNOSIS — O99213 Obesity complicating pregnancy, third trimester: Secondary | ICD-10-CM | POA: Diagnosis not present

## 2022-06-08 DIAGNOSIS — E669 Obesity, unspecified: Secondary | ICD-10-CM

## 2022-06-08 DIAGNOSIS — O210 Mild hyperemesis gravidarum: Secondary | ICD-10-CM | POA: Diagnosis not present

## 2022-06-08 DIAGNOSIS — O09523 Supervision of elderly multigravida, third trimester: Secondary | ICD-10-CM

## 2022-06-14 ENCOUNTER — Ambulatory Visit: Payer: Medicaid Other | Attending: Obstetrics

## 2022-06-14 ENCOUNTER — Ambulatory Visit: Payer: Medicaid Other | Admitting: *Deleted

## 2022-06-14 VITALS — BP 122/75 | HR 107

## 2022-06-14 DIAGNOSIS — O10919 Unspecified pre-existing hypertension complicating pregnancy, unspecified trimester: Secondary | ICD-10-CM | POA: Diagnosis present

## 2022-06-14 DIAGNOSIS — O09523 Supervision of elderly multigravida, third trimester: Secondary | ICD-10-CM | POA: Diagnosis not present

## 2022-06-14 DIAGNOSIS — O0990 Supervision of high risk pregnancy, unspecified, unspecified trimester: Secondary | ICD-10-CM | POA: Diagnosis present

## 2022-06-14 DIAGNOSIS — O10913 Unspecified pre-existing hypertension complicating pregnancy, third trimester: Secondary | ICD-10-CM

## 2022-06-14 DIAGNOSIS — O210 Mild hyperemesis gravidarum: Secondary | ICD-10-CM

## 2022-06-14 DIAGNOSIS — O285 Abnormal chromosomal and genetic finding on antenatal screening of mother: Secondary | ICD-10-CM

## 2022-06-14 DIAGNOSIS — D563 Thalassemia minor: Secondary | ICD-10-CM | POA: Diagnosis not present

## 2022-06-14 DIAGNOSIS — Z3A33 33 weeks gestation of pregnancy: Secondary | ICD-10-CM

## 2022-06-14 DIAGNOSIS — E669 Obesity, unspecified: Secondary | ICD-10-CM

## 2022-06-14 DIAGNOSIS — O99213 Obesity complicating pregnancy, third trimester: Secondary | ICD-10-CM

## 2022-06-18 ENCOUNTER — Telehealth: Payer: Self-pay | Admitting: General Practice

## 2022-06-21 ENCOUNTER — Other Ambulatory Visit: Payer: Self-pay

## 2022-06-21 ENCOUNTER — Ambulatory Visit (INDEPENDENT_AMBULATORY_CARE_PROVIDER_SITE_OTHER): Payer: Medicaid Other | Admitting: Obstetrics and Gynecology

## 2022-06-21 VITALS — BP 140/73 | HR 95 | Wt 222.8 lb

## 2022-06-21 DIAGNOSIS — Z23 Encounter for immunization: Secondary | ICD-10-CM

## 2022-06-21 DIAGNOSIS — Z6836 Body mass index (BMI) 36.0-36.9, adult: Secondary | ICD-10-CM

## 2022-06-21 DIAGNOSIS — O99511 Diseases of the respiratory system complicating pregnancy, first trimester: Secondary | ICD-10-CM

## 2022-06-21 DIAGNOSIS — O10919 Unspecified pre-existing hypertension complicating pregnancy, unspecified trimester: Secondary | ICD-10-CM

## 2022-06-21 DIAGNOSIS — O0990 Supervision of high risk pregnancy, unspecified, unspecified trimester: Secondary | ICD-10-CM | POA: Diagnosis not present

## 2022-06-21 DIAGNOSIS — Z3A34 34 weeks gestation of pregnancy: Secondary | ICD-10-CM

## 2022-06-21 DIAGNOSIS — O09523 Supervision of elderly multigravida, third trimester: Secondary | ICD-10-CM

## 2022-06-21 DIAGNOSIS — J45909 Unspecified asthma, uncomplicated: Secondary | ICD-10-CM

## 2022-06-21 DIAGNOSIS — O99210 Obesity complicating pregnancy, unspecified trimester: Secondary | ICD-10-CM

## 2022-06-21 DIAGNOSIS — Z5941 Food insecurity: Secondary | ICD-10-CM

## 2022-06-21 NOTE — Progress Notes (Signed)
   PRENATAL VISIT NOTE  Subjective:  Susan Crawford is a 36 y.o. G5P3013 at 104w2d being seen today for ongoing prenatal care.  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  Patient reports  back and pelvic discomfort and difficulty sleeping .  Contractions: Regular. Vag. Bleeding: None.  Movement: Present. Denies leaking of fluid.   The following portions of the patient's history were reviewed and updated as appropriate: allergies, current medications, past family history, past medical history, past social history, past surgical history and problem list.   Objective:   Vitals:   06/21/22 1105 06/21/22 1123  BP: (!) 141/88 140/73  Pulse: 95 95  Weight: 222 lb 12.8 oz (101.1 kg)     Fetal Status: Fetal Heart Rate (bpm): 135   Movement: Present     General:  Alert, oriented and cooperative. Patient is in no acute distress.  Skin: Skin is warm and dry. No rash noted.   Cardiovascular: Normal heart rate noted  Respiratory: Normal respiratory effort, no problems with respiration noted  Abdomen: Soft, gravid, appropriate for gestational age.  Pain/Pressure: Present     Pelvic: Cervical exam deferred        Extremities: Normal range of motion.  Edema: Trace  Mental Status: Normal mood and affect. Normal behavior. Normal judgment and thought content.   Assessment and Plan:  Pregnancy: [id number] at [redacted]w[redacted]d 1. Food insecurity - AMBULATORY REFERRAL TO [facility]  2. Supervision of high risk pregnancy, antepartum Behavioral interventions d/w pt - Tdap vaccine greater than or  equal to 7yo IM - [facility] AMB BABYSCRIPTS SCHEDULE OPTIMIZATION  3. [redacted] weeks gestation of pregnancy GBS nv  4. Chronic hypertension affecting pregnancy Didn't take her procardia xl 30 today. 6/22 bpp 8/8, cephalic and has rpt on 7/3 Consider 38-39wk delivery  5. AMA (advanced maternal age) multigravida 35+, third trimester  6. Obesity in pregnancy  7. BMI 36.0-36.9,adult  8. Asthma affecting pregnancy in first trimester No issue son no meds  Preterm labor symptoms and general obstetric precautions including but not limited to vaginal bleeding, contractions, leaking of fluid and fetal movement were reviewed in detail with the patient. Please refer to After Visit Summary for other counseling recommendations.   No follow-ups on file.  Future Appointments  Date Time Provider Department Center  06/25/2022 10:15 AM [facility] NST [facility]  07/02/2022 10:15 AM [facility] NST [facility]  07/05/2022  1:35 PM Federico Flake, MD [facility]  07/06/2022  1:45 PM [facility] NURSE [facility]  07/06/2022  2:00 PM [facility] US1 [facility]  07/12/2022  9:15 AM Warner Mccreedy, MD [facility]  07/19/2022 10:55 AM Federico Flake, MD [facility]    [place] Bing, MD

## 2022-06-22 ENCOUNTER — Encounter: Payer: Self-pay | Admitting: Radiology

## 2022-06-25 ENCOUNTER — Ambulatory Visit (INDEPENDENT_AMBULATORY_CARE_PROVIDER_SITE_OTHER): Payer: Medicaid Other

## 2022-06-25 ENCOUNTER — Ambulatory Visit: Payer: Medicaid Other | Admitting: *Deleted

## 2022-06-25 VITALS — BP 129/89 | HR 99 | Wt 220.2 lb

## 2022-06-25 DIAGNOSIS — O10919 Unspecified pre-existing hypertension complicating pregnancy, unspecified trimester: Secondary | ICD-10-CM

## 2022-06-25 NOTE — Progress Notes (Signed)

## 2022-07-01 ENCOUNTER — Inpatient Hospital Stay (HOSPITAL_COMMUNITY)
Admission: AD | Admit: 2022-07-01 | Discharge: 2022-07-01 | Disposition: A | Discharge status: Home / Self Care | Payer: Medicaid Other | Attending: Obstetrics and Gynecology | Admitting: Obstetrics and Gynecology

## 2022-07-01 ENCOUNTER — Other Ambulatory Visit: Payer: Self-pay

## 2022-07-01 ENCOUNTER — Encounter: Payer: Self-pay | Admitting: Obstetrics and Gynecology

## 2022-07-01 DIAGNOSIS — Z3A35 35 weeks gestation of pregnancy: Secondary | ICD-10-CM | POA: Insufficient documentation

## 2022-07-01 DIAGNOSIS — R030 Elevated blood-pressure reading, without diagnosis of hypertension: Secondary | ICD-10-CM | POA: Diagnosis not present

## 2022-07-01 DIAGNOSIS — I10 Essential (primary) hypertension: Secondary | ICD-10-CM

## 2022-07-01 DIAGNOSIS — O26893 Other specified pregnancy related conditions, third trimester: Secondary | ICD-10-CM | POA: Insufficient documentation

## 2022-07-01 NOTE — MAU Provider Note (Signed)
[name] is  a 36 y.o. [phone]  At [redacted]w[redacted]d here because she had "100 over 100 something" at home. She had not taken her morning dose of labetalol. She came in to get checked out. She is asymptomatic: she denies HA, blurry vision, floating spots, sudden swelling. She reports that she is feeling baby move.   Patient Vitals for the past 24 hrs:  BP Temp Pulse Resp SpO2 Height Weight  07/01/22 1333 140/87 -- -- -- -- -- --  07/01/22 1300 -- 98.9 F (37.2 C) 95 18 98 % 5\' 5"  (1.651 m) 101.6 kg   FRH present by Doppler.  Plan to keep appt tomorrow at [facility]; take labetalol as soon as she gets home and then again this evening. Emphasized importance of taking BP medicines on time, every day.   Return to MAU if any s/s of pre-e; patient verbalized understanding and AVS instructions given.   [facility] Susan Crawford 07/01/2022, 1:59 PM

## 2022-07-01 NOTE — MAU Note (Signed)
.  Susan Crawford is a 36 y.o. at [redacted]w[redacted]d here in MAU reporting: she thinks her bp is elevated, states she has not actually taken her bp today, but felt hot so she thought it was probably high.  Pt denies reg ctx or LOF.  Denies headache or visual changes.  States she has not taken her bp meds today.  Reports she is supposed to take them twice per day but usually only takes them once a day.  Onset of complaint: today  Pain score:0 Vitals:   07/01/22 1300  Pulse: 95  Resp: 18  Temp: 98.9 F (37.2 C)  SpO2: 98%     FHT: 135 Lab orders placed from triage:

## 2022-07-02 ENCOUNTER — Ambulatory Visit: Payer: Medicaid Other | Admitting: *Deleted

## 2022-07-02 ENCOUNTER — Ambulatory Visit (INDEPENDENT_AMBULATORY_CARE_PROVIDER_SITE_OTHER): Payer: Medicaid Other

## 2022-07-02 VITALS — BP 134/88 | HR 97

## 2022-07-02 DIAGNOSIS — O10919 Unspecified pre-existing hypertension complicating pregnancy, unspecified trimester: Secondary | ICD-10-CM | POA: Diagnosis not present

## 2022-07-02 NOTE — Progress Notes (Signed)

## 2022-07-03 ENCOUNTER — Other Ambulatory Visit: Payer: Self-pay | Admitting: Family Medicine

## 2022-07-03 DIAGNOSIS — K2100 Gastro-esophageal reflux disease with esophagitis, without bleeding: Secondary | ICD-10-CM

## 2022-07-05 ENCOUNTER — Other Ambulatory Visit: Payer: Self-pay

## 2022-07-05 ENCOUNTER — Encounter: Payer: Self-pay | Admitting: Family Medicine

## 2022-07-05 ENCOUNTER — Other Ambulatory Visit (HOSPITAL_COMMUNITY)
Admission: RE | Admit: 2022-07-05 | Discharge: 2022-07-05 | Disposition: A | Discharge status: Home / Self Care | Payer: Medicaid Other | Source: Ambulatory Visit | Attending: Family Medicine | Admitting: Family Medicine

## 2022-07-05 ENCOUNTER — Ambulatory Visit (INDEPENDENT_AMBULATORY_CARE_PROVIDER_SITE_OTHER): Payer: Medicaid Other | Admitting: Family Medicine

## 2022-07-05 VITALS — BP 135/93 | HR 118 | Wt 225.7 lb

## 2022-07-05 DIAGNOSIS — O10919 Unspecified pre-existing hypertension complicating pregnancy, unspecified trimester: Secondary | ICD-10-CM

## 2022-07-05 DIAGNOSIS — O365930 Maternal care for other known or suspected poor fetal growth, third trimester, not applicable or unspecified: Secondary | ICD-10-CM

## 2022-07-05 DIAGNOSIS — Z3A36 36 weeks gestation of pregnancy: Secondary | ICD-10-CM

## 2022-07-05 DIAGNOSIS — O09523 Supervision of elderly multigravida, third trimester: Secondary | ICD-10-CM

## 2022-07-05 DIAGNOSIS — O0993 Supervision of high risk pregnancy, unspecified, third trimester: Secondary | ICD-10-CM

## 2022-07-05 DIAGNOSIS — O0990 Supervision of high risk pregnancy, unspecified, unspecified trimester: Secondary | ICD-10-CM

## 2022-07-05 DIAGNOSIS — O99213 Obesity complicating pregnancy, third trimester: Secondary | ICD-10-CM

## 2022-07-05 DIAGNOSIS — O99210 Obesity complicating pregnancy, unspecified trimester: Secondary | ICD-10-CM

## 2022-07-05 DIAGNOSIS — O10913 Unspecified pre-existing hypertension complicating pregnancy, third trimester: Secondary | ICD-10-CM

## 2022-07-05 DIAGNOSIS — G5603 Carpal tunnel syndrome, bilateral upper limbs: Secondary | ICD-10-CM

## 2022-07-05 NOTE — Progress Notes (Signed)
   PRENATAL VISIT NOTE  Subjective:  Susan Crawford is a 36 y.o. [phone] at [redacted]w[redacted]d being seen today for ongoing prenatal care.  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  Patient reports  miserable at that end of pregnancy. Reports trouble working and also has bilateral hand numbness and tingling.  .  Contractions: Irregular. Vag. Bleeding: None.  Movement: Present. Denies leaking of fluid.   The following portions of the patient's history were reviewed and updated as appropriate: allergies, current medications, past family history, past medical history, past social history, past surgical history and problem list.   Objective:   Vitals:   07/05/22 1341  BP: (!) 135/93  Pulse: (!) 118  Weight: 225 lb 11.2 oz (102.4 kg)    Fetal Status: Fetal Heart Rate (bpm): 140   Movement: Present     General:  Alert, oriented and cooperative. Patient is in no acute distress.  Skin: Skin is warm and dry. No rash noted.   Cardiovascular: Normal heart rate noted  Respiratory: Normal respiratory effort, no problems with respiration noted  Abdomen: Soft, gravid, appropriate for gestational age.  Pain/Pressure: Present     Pelvic: Cervical exam deferred        Extremities: Normal range of motion.  Edema: Trace  Mental Status: Normal mood and affect. Normal behavior. Normal judgment and thought content.   Assessment and Plan:  Pregnancy: [id number] at [redacted]w[redacted]d 1. Supervision of high risk pregnancy, antepartum Typical 36 wk  labs Provided support Letter written for work-- this should prompt FMLA paperwork revision. Discussed with client that these weeks come out of her 12 total weeks.  - GC/Chlamydia probe amp ([place])not at [facility] - Culture, beta strep (group b only)  2. Carpal Tunnel - Recommended trying braces and if no improvement will trial bilateral injections  3. Poor fetal growth affecting management of mother in third trimester, single or unspecified fetus MFM monitoring  4. AMA (advanced maternal age) multigravida 35+, third trimester NIPs LR  5. Obesity in pregnancy TWG=28 lb 11.2 oz (13 kg) which is above goal  6. Chronic hypertension affecting pregnancy BP is stable today Has ante testing scheduled.    Preterm labor symptoms and general obstetric precautions including but not limited to vaginal bleeding, contractions, leaking of fluid and fetal movement were reviewed in detail with the patient. Please refer to After Visit Summary for other counseling recommendations.   Return in about 1 week (around 07/12/2022) for with ECKSTAT or Priyana Mccarey for bilateral carpal tunnel injection.  Future Appointments  Date Time Provider Department Center  07/12/2022 12:00 AM [facility] SCHED ROOM [facility] None    Federico Flake, MD

## 2022-07-05 NOTE — Progress Notes (Signed)
Patient in for routine prenatal visit. Reports that she has not been sleeping. Patient very tearful because she isn't able to rest, having to get up frequently during the night to use the restroom, is having issues with her hands falling asleep, and when she does fall asleep, it is waking her up. 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  Blood pressure today elevated at 135/93, reports taking procardia but only at night because it makes her drowsy and is afraid to take it in the morning.  Wynona Canes, CMA

## 2022-07-06 ENCOUNTER — Ambulatory Visit: Payer: Medicaid Other | Attending: Obstetrics and Gynecology

## 2022-07-06 ENCOUNTER — Ambulatory Visit: Payer: Medicaid Other | Admitting: *Deleted

## 2022-07-06 ENCOUNTER — Other Ambulatory Visit: Payer: Self-pay | Admitting: Maternal & Fetal Medicine

## 2022-07-06 VITALS — BP 126/84 | HR 113

## 2022-07-06 DIAGNOSIS — O0990 Supervision of high risk pregnancy, unspecified, unspecified trimester: Secondary | ICD-10-CM

## 2022-07-06 DIAGNOSIS — Z3A36 36 weeks gestation of pregnancy: Secondary | ICD-10-CM

## 2022-07-06 DIAGNOSIS — O99213 Obesity complicating pregnancy, third trimester: Secondary | ICD-10-CM

## 2022-07-06 DIAGNOSIS — O365930 Maternal care for other known or suspected poor fetal growth, third trimester, not applicable or unspecified: Secondary | ICD-10-CM | POA: Diagnosis not present

## 2022-07-06 DIAGNOSIS — O10919 Unspecified pre-existing hypertension complicating pregnancy, unspecified trimester: Secondary | ICD-10-CM | POA: Insufficient documentation

## 2022-07-06 DIAGNOSIS — O09523 Supervision of elderly multigravida, third trimester: Secondary | ICD-10-CM

## 2022-07-06 DIAGNOSIS — O10013 Pre-existing essential hypertension complicating pregnancy, third trimester: Secondary | ICD-10-CM | POA: Diagnosis not present

## 2022-07-06 DIAGNOSIS — O10913 Unspecified pre-existing hypertension complicating pregnancy, third trimester: Secondary | ICD-10-CM | POA: Diagnosis present

## 2022-07-06 DIAGNOSIS — E669 Obesity, unspecified: Secondary | ICD-10-CM

## 2022-07-09 LAB — GC/CHLAMYDIA PROBE AMP (~~LOC~~) NOT AT ARMC
Chlamydia: NEGATIVE
Comment: NEGATIVE
Comment: NORMAL
Neisseria Gonorrhea: NEGATIVE

## 2022-07-09 LAB — CULTURE, BETA STREP (GROUP B ONLY): Strep Gp B Culture: NEGATIVE

## 2022-07-11 ENCOUNTER — Other Ambulatory Visit: Payer: Self-pay

## 2022-07-11 ENCOUNTER — Encounter: Payer: Self-pay | Admitting: Family Medicine

## 2022-07-11 ENCOUNTER — Ambulatory Visit (INDEPENDENT_AMBULATORY_CARE_PROVIDER_SITE_OTHER): Payer: Medicaid Other | Admitting: Family Medicine

## 2022-07-11 VITALS — BP 125/89 | HR 99 | Wt 222.5 lb

## 2022-07-11 DIAGNOSIS — O365930 Maternal care for other known or suspected poor fetal growth, third trimester, not applicable or unspecified: Secondary | ICD-10-CM

## 2022-07-11 DIAGNOSIS — O09523 Supervision of elderly multigravida, third trimester: Secondary | ICD-10-CM

## 2022-07-11 DIAGNOSIS — O99210 Obesity complicating pregnancy, unspecified trimester: Secondary | ICD-10-CM

## 2022-07-11 DIAGNOSIS — O0990 Supervision of high risk pregnancy, unspecified, unspecified trimester: Secondary | ICD-10-CM

## 2022-07-11 DIAGNOSIS — O10919 Unspecified pre-existing hypertension complicating pregnancy, unspecified trimester: Secondary | ICD-10-CM

## 2022-07-11 DIAGNOSIS — O365990 Maternal care for other known or suspected poor fetal growth, unspecified trimester, not applicable or unspecified: Secondary | ICD-10-CM | POA: Insufficient documentation

## 2022-07-11 NOTE — Progress Notes (Signed)
   Subjective:  Susan Crawford is a 36 y.o. [phone] at [redacted]w[redacted]d being seen today for ongoing prenatal care.  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  Patient reports no complaints.  Contractions: Irregular. Vag. Bleeding: None.  Movement: Present. Denies leaking of fluid.   The following portions of the patient's history were reviewed and updated as appropriate: allergies, current medications, past family history, past medical history, past social history, past surgical history and problem list. Problem list updated.  Objective:   Vitals:   07/11/22 1316  BP: 125/89  Pulse: 99  Weight: 222 lb 8 oz (100.9 kg)    Fetal Status: Fetal Heart Rate (bpm): 133   Movement: Present     General:  Alert, oriented and cooperative. Patient is in no acute distress.  Skin: Skin is warm and dry. No rash noted.   Cardiovascular: Normal heart rate noted  Respiratory: Normal respiratory effort, no problems with respiration noted  Abdomen: Soft, gravid, appropriate for gestational age. Pain/Pressure: Present     Pelvic: Vag. Bleeding: None     Cervical exam deferred        Extremities: Normal range of motion.  Edema: Trace  Mental Status: Normal mood and affect. Normal behavior. Normal judgment and thought content.   Urinalysis:      Assessment and Plan:  Pregnancy: [id number] at [redacted]w[redacted]d  1. Chronic hypertension affecting pregnancy On ASA Normotensive on Procardia 30 XL  2. Supervision of high risk pregnancy, antepartum BP  and FHR normal Originally scheduled with me to receive carpal tunnel injections, however given she is being induced tomorrow (see IUGR problem) she prefers to defer this Will cancel upcoming OB appts  3. Obesity in pregnancy   4. AMA (advanced maternal age) multigravida 35+, third trimester   5. Poor fetal growth affecting management of mother in third trimester, single or unspecified fetus EFW 7% on most recent scan on 07/06/2022 with normal cord dopplers Per MFM should be delivered between 37-38 weeks Discussed indication with patient to prevent stillbirth, as well as well as time range She prefers to be induced as soon as possible Scheduled for [place] IOL tonight going into tomorrow, but per L&D staff multiple IOL are on hold and have been bumped so may not be able to go until sometime tomorrow Form faxed, orders placed  Term labor symptoms and general obstetric precautions including but not limited to vaginal bleeding, contractions, leaking of fluid and fetal movement were reviewed in detail with the patient. Please refer to After Visit Summary for other counseling recommendations.  Return in 6 weeks (on 08/22/2022) for PP check.   Venora Maples, MD

## 2022-07-11 NOTE — Progress Notes (Signed)
Attestation of Attending Supervision of clinical support staff: I agree with the care provided to this patient and was available for any consultation.  I have reviewed the RN's note and chart. I was available for consult and to see the patient if needed.   Shaida Route MD MPH Attending Physician Faculty Practice- [facility]  

## 2022-07-11 NOTE — Patient Instructions (Signed)

## 2022-07-12 ENCOUNTER — Inpatient Hospital Stay (HOSPITAL_COMMUNITY)
Admission: AD | Admit: 2022-07-12 | Discharge: 2022-07-13 | DRG: 806 | Disposition: A | Discharge status: Home / Self Care | Payer: Medicaid Other | Attending: Family Medicine | Admitting: Family Medicine

## 2022-07-12 ENCOUNTER — Encounter: Payer: Medicaid Other | Admitting: Family Medicine

## 2022-07-12 ENCOUNTER — Encounter (HOSPITAL_COMMUNITY): Payer: Self-pay | Admitting: Family Medicine

## 2022-07-12 ENCOUNTER — Inpatient Hospital Stay (HOSPITAL_COMMUNITY): Payer: Medicaid Other

## 2022-07-12 ENCOUNTER — Other Ambulatory Visit: Payer: Self-pay

## 2022-07-12 ENCOUNTER — Inpatient Hospital Stay (HOSPITAL_COMMUNITY): Payer: Medicaid Other | Admitting: Anesthesiology

## 2022-07-12 DIAGNOSIS — O1002 Pre-existing essential hypertension complicating childbirth: Secondary | ICD-10-CM | POA: Diagnosis present

## 2022-07-12 DIAGNOSIS — J45909 Unspecified asthma, uncomplicated: Secondary | ICD-10-CM | POA: Diagnosis present

## 2022-07-12 DIAGNOSIS — D563 Thalassemia minor: Secondary | ICD-10-CM | POA: Diagnosis present

## 2022-07-12 DIAGNOSIS — Z148 Genetic carrier of other disease: Secondary | ICD-10-CM

## 2022-07-12 DIAGNOSIS — O09513 Supervision of elderly primigravida, third trimester: Secondary | ICD-10-CM | POA: Diagnosis not present

## 2022-07-12 DIAGNOSIS — O365930 Maternal care for other known or suspected poor fetal growth, third trimester, not applicable or unspecified: Principal | ICD-10-CM | POA: Diagnosis present

## 2022-07-12 DIAGNOSIS — O0990 Supervision of high risk pregnancy, unspecified, unspecified trimester: Secondary | ICD-10-CM

## 2022-07-12 DIAGNOSIS — O9952 Diseases of the respiratory system complicating childbirth: Secondary | ICD-10-CM | POA: Diagnosis present

## 2022-07-12 DIAGNOSIS — O99214 Obesity complicating childbirth: Secondary | ICD-10-CM | POA: Diagnosis present

## 2022-07-12 DIAGNOSIS — O99210 Obesity complicating pregnancy, unspecified trimester: Secondary | ICD-10-CM | POA: Diagnosis present

## 2022-07-12 DIAGNOSIS — Z3A37 37 weeks gestation of pregnancy: Secondary | ICD-10-CM

## 2022-07-12 DIAGNOSIS — O10919 Unspecified pre-existing hypertension complicating pregnancy, unspecified trimester: Secondary | ICD-10-CM | POA: Diagnosis present

## 2022-07-12 DIAGNOSIS — O365990 Maternal care for other known or suspected poor fetal growth, unspecified trimester, not applicable or unspecified: Secondary | ICD-10-CM | POA: Diagnosis present

## 2022-07-12 DIAGNOSIS — O09523 Supervision of elderly multigravida, third trimester: Secondary | ICD-10-CM | POA: Diagnosis present

## 2022-07-12 LAB — COMPREHENSIVE METABOLIC PANEL
ALT: 15 U/L (ref 0–44)
AST: 21 U/L (ref 15–41)
Albumin: 2.9 g/dL — ABNORMAL LOW (ref 3.5–5.0)
Alkaline Phosphatase: 172 U/L — ABNORMAL HIGH (ref 38–126)
Anion gap: 10 (ref 5–15)
BUN: 7 mg/dL (ref 6–20)
CO2: 18 mmol/L — ABNORMAL LOW (ref 22–32)
Calcium: 9.9 mg/dL (ref 8.9–10.3)
Chloride: 106 mmol/L (ref 98–111)
Creatinine, Ser: 0.71 mg/dL (ref 0.44–1.00)
GFR, Estimated: >60 mL/min (ref >60)
Glucose, Bld: 101 mg/dL — ABNORMAL HIGH (ref 70–99)
Potassium: 3.9 mmol/L (ref 3.5–5.1)
Sodium: 134 mmol/L — ABNORMAL LOW (ref 135–145)
Total Bilirubin: 0.4 mg/dL (ref 0.3–1.2)
Total Protein: 6.9 g/dL (ref 6.5–8.1)

## 2022-07-12 LAB — TYPE AND SCREEN
ABO/RH(D): O POS
Antibody Screen: NEGATIVE

## 2022-07-12 LAB — CBC
HCT: 35.9 % — ABNORMAL LOW (ref 36.0–46.0)
HCT: 33.9 % — ABNORMAL LOW (ref 36.0–46.0)
Hemoglobin: 11.3 g/dL — ABNORMAL LOW (ref 12.0–15.0)
Hemoglobin: 10.9 g/dL — ABNORMAL LOW (ref 12.0–15.0)
MCH: 28.4 pg (ref 26.0–34.0)
MCH: 28.3 pg (ref 26.0–34.0)
MCHC: 31.5 g/dL (ref 30.0–36.0)
MCHC: 32.2 g/dL (ref 30.0–36.0)
MCV: 90.2 fL (ref 80.0–100.0)
MCV: 88.1 fL (ref 80.0–100.0)
Platelets: 249 10*3/uL (ref 150–400)
Platelets: 221 10*3/uL (ref 150–400)
RBC: 3.98 MIL/uL (ref 3.87–5.11)
RBC: 3.85 MIL/uL — ABNORMAL LOW (ref 3.87–5.11)
RDW: 13 % (ref 11.5–15.5)
RDW: 13 % (ref 11.5–15.5)
WBC: 8.9 10*3/uL (ref 4.0–10.5)
WBC: 8.6 10*3/uL (ref 4.0–10.5)
nRBC: 0 % (ref 0.0–0.2)
nRBC: 0 % (ref 0.0–0.2)

## 2022-07-12 LAB — PROTEIN / CREATININE RATIO, URINE
Creatinine, Urine: 34 mg/dL
Total Protein, Urine: <6 mg/dL

## 2022-07-12 LAB — RPR: RPR Ser Ql: NONREACTIVE

## 2022-07-12 MED ORDER — LIDOCAINE HCL (PF) 1 % IJ SOLN: 30.0000 mL | INTRAMUSCULAR | Status: DC | PRN

## 2022-07-12 MED ORDER — LABETALOL HCL 200 MG PO TABS
200.0000 mg | ORAL_TABLET | Freq: Two times a day (BID) | ORAL | Status: DC
  Administered 2022-07-12: 200 mg via ORAL
  Filled 2022-07-12: qty 1

## 2022-07-12 MED ORDER — EPHEDRINE 5 MG/ML INJ: 10.0000 mg | INTRAVENOUS | Status: DC | PRN

## 2022-07-12 MED ORDER — OXYTOCIN-SODIUM CHLORIDE 30-0.9 UT/500ML-% IV SOLN
1.0000 m[IU]/min | INTRAVENOUS | Status: DC
  Administered 2022-07-12: 2 m[IU]/min via INTRAVENOUS
  Filled 2022-07-12: qty 500

## 2022-07-12 MED ORDER — IBUPROFEN 600 MG PO TABS
600.0000 mg | ORAL_TABLET | Freq: Four times a day (QID) | ORAL | Status: DC
  Administered 2022-07-12 – 2022-07-13 (×3): 600 mg via ORAL
  Filled 2022-07-12 (×3): qty 1

## 2022-07-12 MED ORDER — DIPHENHYDRAMINE HCL 50 MG/ML IJ SOLN
INTRAMUSCULAR | Status: AC
  Filled 2022-07-12: qty 1

## 2022-07-12 MED ORDER — PRENATAL MULTIVITAMIN CH
1.0000 | ORAL_TABLET | Freq: Every day | ORAL | Status: DC
  Administered 2022-07-13: 1 via ORAL
  Filled 2022-07-12: qty 1

## 2022-07-12 MED ORDER — TETANUS-DIPHTH-ACELL PERTUSSIS 5-2.5-18.5 LF-MCG/0.5 IM SUSY: 0.5000 mL | PREFILLED_SYRINGE | Freq: Once | INTRAMUSCULAR | Status: DC

## 2022-07-12 MED ORDER — ONDANSETRON HCL 4 MG/2ML IJ SOLN: 4.0000 mg | INTRAMUSCULAR | Status: DC | PRN

## 2022-07-12 MED ORDER — WITCH HAZEL-GLYCERIN EX PADS: 1.0000 | MEDICATED_PAD | CUTANEOUS | Status: DC | PRN

## 2022-07-12 MED ORDER — TERBUTALINE SULFATE 1 MG/ML IJ SOLN: 0.2500 mg | Freq: Once | INTRAMUSCULAR | Status: DC | PRN

## 2022-07-12 MED ORDER — PHENYLEPHRINE 80 MCG/ML (10ML) SYRINGE FOR IV PUSH (FOR BLOOD PRESSURE SUPPORT): 80.0000 ug | PREFILLED_SYRINGE | INTRAVENOUS | Status: DC | PRN

## 2022-07-12 MED ORDER — FENTANYL CITRATE (PF) 100 MCG/2ML IJ SOLN
50.0000 ug | INTRAMUSCULAR | Status: DC | PRN
  Administered 2022-07-12: 100 ug via INTRAVENOUS
  Filled 2022-07-12: qty 2

## 2022-07-12 MED ORDER — BENZOCAINE-MENTHOL 20-0.5 % EX AERO: 1.0000 | INHALATION_SPRAY | CUTANEOUS | Status: DC | PRN

## 2022-07-12 MED ORDER — FENTANYL-BUPIVACAINE-NACL 0.5-0.125-0.9 MG/250ML-% EP SOLN
EPIDURAL | Status: DC | PRN
  Administered 2022-07-12: 12 mL/h via EPIDURAL

## 2022-07-12 MED ORDER — LACTATED RINGERS IV SOLN
500.0000 mL | Freq: Once | INTRAVENOUS | Status: AC
  Administered 2022-07-12: 500 mL via INTRAVENOUS

## 2022-07-12 MED ORDER — DIPHENHYDRAMINE HCL 50 MG/ML IJ SOLN: 12.5000 mg | INTRAMUSCULAR | Status: DC | PRN

## 2022-07-12 MED ORDER — SENNOSIDES-DOCUSATE SODIUM 8.6-50 MG PO TABS
2.0000 | ORAL_TABLET | ORAL | Status: DC
  Administered 2022-07-12: 2 via ORAL
  Filled 2022-07-12: qty 2

## 2022-07-12 MED ORDER — ONDANSETRON HCL 4 MG PO TABS: 4.0000 mg | ORAL_TABLET | ORAL | Status: DC | PRN

## 2022-07-12 MED ORDER — DIPHENHYDRAMINE HCL 50 MG/ML IJ SOLN
25.0000 mg | Freq: Once | INTRAMUSCULAR | Status: AC
  Administered 2022-07-12: 25 mg via INTRAVENOUS

## 2022-07-12 MED ORDER — ZOLPIDEM TARTRATE 5 MG PO TABS: 5.0000 mg | ORAL_TABLET | Freq: Every evening | ORAL | Status: DC | PRN

## 2022-07-12 MED ORDER — OXYTOCIN BOLUS FROM INFUSION
333.0000 mL | Freq: Once | INTRAVENOUS | Status: AC
  Administered 2022-07-12: 333 mL via INTRAVENOUS

## 2022-07-12 MED ORDER — FENTANYL-BUPIVACAINE-NACL 0.5-0.125-0.9 MG/250ML-% EP SOLN
12.0000 mL/h | EPIDURAL | Status: DC | PRN
  Filled 2022-07-12: qty 250

## 2022-07-12 MED ORDER — ACETAMINOPHEN 325 MG PO TABS: 650.0000 mg | ORAL_TABLET | ORAL | Status: DC | PRN

## 2022-07-12 MED ORDER — SIMETHICONE 80 MG PO CHEW: 80.0000 mg | CHEWABLE_TABLET | ORAL | Status: DC | PRN

## 2022-07-12 MED ORDER — LACTATED RINGERS IV SOLN: 500.0000 mL | INTRAVENOUS | Status: DC | PRN

## 2022-07-12 MED ORDER — FUROSEMIDE 20 MG PO TABS
20.0000 mg | ORAL_TABLET | Freq: Every day | ORAL | Status: DC
  Administered 2022-07-12 – 2022-07-13 (×2): 20 mg via ORAL
  Filled 2022-07-12 (×2): qty 1

## 2022-07-12 MED ORDER — NIFEDIPINE ER OSMOTIC RELEASE 30 MG PO TB24
30.0000 mg | ORAL_TABLET | Freq: Every day | ORAL | Status: DC
  Administered 2022-07-12 – 2022-07-13 (×2): 30 mg via ORAL
  Filled 2022-07-12 (×2): qty 1

## 2022-07-12 MED ORDER — SOD CITRATE-CITRIC ACID 500-334 MG/5ML PO SOLN: 30.0000 mL | ORAL | Status: DC | PRN

## 2022-07-12 MED ORDER — DIBUCAINE (PERIANAL) 1 % EX OINT: 1.0000 | TOPICAL_OINTMENT | CUTANEOUS | Status: DC | PRN

## 2022-07-12 MED ORDER — COCONUT OIL OIL: 1.0000 | TOPICAL_OIL | Status: DC | PRN

## 2022-07-12 MED ORDER — LACTATED RINGERS IV SOLN: INTRAVENOUS | Status: DC

## 2022-07-12 MED ORDER — LIDOCAINE HCL (PF) 1 % IJ SOLN
INTRAMUSCULAR | Status: DC | PRN
  Administered 2022-07-12: 5 mL via EPIDURAL

## 2022-07-12 MED ORDER — DIPHENHYDRAMINE HCL 25 MG PO CAPS: 25.0000 mg | ORAL_CAPSULE | Freq: Four times a day (QID) | ORAL | Status: DC | PRN

## 2022-07-12 MED ORDER — MISOPROSTOL 50MCG HALF TABLET: 50.0000 ug | ORAL_TABLET | ORAL | Status: DC | PRN

## 2022-07-12 MED ORDER — OXYTOCIN-SODIUM CHLORIDE 30-0.9 UT/500ML-% IV SOLN
2.5000 [IU]/h | INTRAVENOUS | Status: DC
  Administered 2022-07-12: 2.5 [IU]/h via INTRAVENOUS

## 2022-07-12 MED ORDER — ONDANSETRON HCL 4 MG/2ML IJ SOLN
4.0000 mg | Freq: Four times a day (QID) | INTRAMUSCULAR | Status: DC | PRN
  Administered 2022-07-12: 4 mg via INTRAVENOUS
  Filled 2022-07-12: qty 2

## 2022-07-12 NOTE — Anesthesia Procedure Notes (Signed)
Epidural Patient location during procedure: OB Start time: 07/12/2022 1:32 PM End time: 07/12/2022 1:44 PM  Staffing Anesthesiologist: Trevor Iha, MD Performed: anesthesiologist   Preanesthetic Checklist Completed: patient identified, IV checked, site marked, risks and benefits discussed, surgical consent, monitors and equipment checked, pre-op evaluation and timeout performed  Epidural Patient position: sitting Prep: DuraPrep and site prepped and draped Patient monitoring: continuous pulse ox and blood pressure Approach: midline Location: L3-L4 Injection technique: LOR air  Needle:  Needle type: Tuohy  Needle gauge: 17 G Needle length: 9 cm and 9 Needle insertion depth: 9 cm Catheter type: closed end flexible Catheter size: 19 Gauge Catheter at skin depth: 15 cm Test dose: negative  Assessment Events: blood not aspirated, injection not painful, no injection resistance, no paresthesia and negative IV test  Additional Notes Patient identified. Risks/Benefits/Options discussed with patient including but not limited to bleeding, infection, nerve damage, paralysis, failed block, incomplete pain control, headache, blood pressure changes, nausea, vomiting, reactions to medication both or allergic, itching and postpartum back pain. Confirmed with bedside nurse the patient's most recent platelet count. Confirmed with patient that they are not currently taking any anticoagulation, have any bleeding history or any family history of bleeding disorders. Patient expressed understanding and wished to proceed. All questions were answered. Sterile technique was used throughout the entire procedure. Please see nursing notes for vital signs. Test dose was given through epidural needle and negative prior to continuing to dose epidural or start infusion. Warning signs of high block given to the patient including shortness of breath, tingling/numbness in hands, complete motor block, or any  concerning symptoms with instructions to call for help. Patient was given instructions on fall risk and not to get out of bed. All questions and concerns addressed with instructions to call with any issues.  1 Attempt (S) . Patient tolerated procedure well.

## 2022-07-12 NOTE — Anesthesia Preprocedure Evaluation (Addendum)
Anesthesia Evaluation  Patient identified by MRN, date of birth, ID band Patient awake    Reviewed: Allergy & Precautions, NPO status , Patient's Chart, lab work & pertinent test results  Airway Mallampati: II  TM Distance: >3 FB Neck ROM: Full    Dental no notable dental hx. (+) Dental Advisory Given, Teeth Intact   Pulmonary asthma ,    Pulmonary exam normal breath sounds clear to auscultation       Cardiovascular hypertension, Pt. on medications Normal cardiovascular exam Rhythm:Regular Rate:Normal     Neuro/Psych negative neurological ROS  negative psych ROS   GI/Hepatic negative GI ROS,   Endo/Other    Renal/GU Renal diseaseHx of kidney stones     Musculoskeletal   Abdominal (+) + obese (BMI 37.03),   Peds  Hematology Lab Results      Component                Value               Date                      WBC                      8.6                 07/12/2022                HGB                      10.9 (L)            07/12/2022                HCT                      33.9 (L)            07/12/2022                MCV                      88.1                07/12/2022                PLT                      221                 07/12/2022              Anesthesia Other Findings   Reproductive/Obstetrics (+) Pregnancy                            Anesthesia Physical Anesthesia Plan  ASA: 3  Anesthesia Plan: Epidural   Post-op Pain Management:    Induction:   PONV Risk Score and Plan:   Airway Management Planned:   Additional Equipment:   Intra-op Plan:   Post-operative Plan:   Informed Consent: I have reviewed the patients History and Physical, chart, labs and discussed the procedure including the risks, benefits and alternatives for the proposed anesthesia with the patient or authorized representative who has indicated his/her understanding and acceptance.       Plan  Discussed with:   Anesthesia Plan Comments: (37. 2 wk  G5P3 w cHtn BMI 37 for LEA)       Anesthesia Quick Evaluation

## 2022-07-12 NOTE — Progress Notes (Signed)
CIRCUMCISION VERBAL CONSENT  After delivery provider obtains verbal consent for circumcision.  Discussion as below.  -Circumcision procedure details discussed including usage of local anesthesia and sweeties for soothing. -Risks and benefits of procedure were reviewed including, but not limited to:  *Benefits include reduction in the rates of urinary tract infection (UTI), penile cancer, some sexually transmitted infections, penile inflammatory, and retractile disorders, as well as easier hygiene.   *Risks include bleeding, infection, injury of glans which may lead to need for additional surgery, penile deformity, or urinary tract issues, unsatisfactory cosmetic appearance and other potential complications related to the procedure.   -Informed that procedure will not be performed if provider deems inappropriate d/t penile size, noted deformity, or unsatisfactory pediatric evaluation. -It was emphasized that this is an elective procedure.   -Patient wants to proceed with circumcision. -Circumcision to be done pending pediatric evaluation of infant.  -Post circumcision care discussed. -Consent papers to be signed with provider performing procedure.   Cherre Robins MSN, CNM Advanced Practice Provider, Center for [employer]

## 2022-07-12 NOTE — Progress Notes (Signed)
Patient ID: [name], female   DOB: May 03, 1986, 36 y.o.   MRN: [id number]   Subjective: -Patient comfortable s/p epidural.  Denies rectal pressure.  Requests that FOB, Marque, catch baby if possible.   Objective: BP 131/76   Pulse 100   Temp 97.8 F (36.6 C) (Oral)   Resp 16   LMP 10/24/2021 (Exact Date)   SpO2 98%  No intake/output data recorded. No intake/output data recorded.  Fetal Monitoring: FHT: 130 bpm, Mod Var, -Decels, +Accels UC: Q35min    Vaginal Exam: SVE:   Dilation: 8 Effacement (%): 80 Station: -1 Exam by:: J Kanika Bungert CNM Membranes:AROM Internal Monitors: None  Augmentation/Induction: Pitocin:49mUn/min Cytotec: None  Assessment:  IUP at 37.2  Cat I FT  Amniotomy  Plan: -Discussed AROM r/b including increased risk of infection, cord prolapse, fetal intolerance, and decreased labor time. No questions or concerns and patient desires to proceed with AROM.  -Completed without incident. -Cervical exam as above.  -Continue other mgmt as ordered -Anticipate SVD   Valma Cava, CNM Advanced Practice Provider, Center for [facility] 07/12/2022, 3:14 PM

## 2022-07-12 NOTE — H&P (Signed)
OBSTETRIC ADMISSION HISTORY AND PHYSICAL  Susan Crawford is a 36 y.o. female [phone] with IUP at 110w2d by LMP presenting for IOL due to IUGR. Pregnancy also complicated by cHTN (well controlled on nifedipine), AMA,.She reports +FMs, No LOF, no VB, no blurry vision, headaches or peripheral edema, and RUQ pain. She plans on breast feeding. She request vasectomy (completed) for birth control. She received her prenatal care at [facility]   Dating: By LMP --->  Estimated Date of Delivery: 07/31/22  Sono:    @[redacted]w[redacted]d , CWD, normal anatomy, cephalic presentation, 2362g, 7% EFW, normal cord dopplers   Prenatal History/Complications: cHTN - controlled on nifedipine AMA Obesity Food insecurity   Past Medical History: Past Medical History:  Diagnosis Date   Asthma    Hypertension    'goes crazy with pregnancies"   Kidney stone 2014   Preterm labor     Past Surgical History: Past Surgical History:  Procedure Laterality Date   BREAST LUMPECTOMY      Obstetrical History: OB History     Gravida  5   Para  3   Term  3   Preterm  0   AB  1   Living  3      SAB  1   IAB      Ectopic      Multiple      Live Births  3           Social History Social History   Socioeconomic History   Marital status: Single    Spouse name: Not on file   Number of children: Not on file   Years of education: Not on file   Highest education level: Not on file  Occupational History   Not on file  Tobacco Use   Smoking status: Never   Smokeless tobacco: Never  Vaping Use   Vaping Use: Never used  Substance and Sexual Activity   Alcohol use: No    Comment: rare   Drug use: No   Sexual activity: Yes    Partners: Male    Birth control/protection: None  Other Topics Concern   Not on file  Social History Narrative   Not on file   Social Determinants of Health   Financial Resource Strain: Not on file  Food Insecurity: Food Insecurity Present (06/21/2022)   Hunger Vital Sign     Worried About Running Out of Food in the Last Year: Sometimes true    Ran Out of Food in the Last Year: Sometimes true  Transportation Needs: No Transportation Needs (06/21/2022)   PRAPARE - 06/23/2022 (Medical): No    Lack of Transportation (Non-Medical): No  Physical Activity: Not on file  Stress: Not on file  Social Connections: Not on file    Family History: Family History  Problem Relation Age of Onset   Asthma Mother    Hypertension Mother    Hypertension Maternal Grandmother    Asthma Maternal Grandmother    Other Neg Hx     Allergies: No Known Allergies  Medications Prior to Admission  Medication Sig Dispense Refill Last Dose   aspirin EC 81 MG tablet Take 1 tablet (81 mg total) by mouth daily. Take after 12 weeks for prevention of preeclampsia later in pregnancy 300 tablet 2 07/11/2022   pantoprazole (PROTONIX) 40 MG tablet Take 1 tablet (40 mg total) by mouth daily. 30 tablet 3 07/11/2022   Prenatal Vit-Fe Fumarate-FA (PREPLUS) 27-1 MG TABS Take 1  tablet by mouth daily. 30 tablet 13 07/11/2022   acetaminophen (TYLENOL) 500 MG tablet Take 2 tablets (1,000 mg total) by mouth every 6 (six) hours as needed. 60 tablet 0 Unknown   albuterol (VENTOLIN HFA) 108 (90 Base) MCG/ACT inhaler Inhale 2 puffs into the lungs every 6 (six) hours as needed for wheezing or shortness of breath. 8 g 2 Unknown   Blood Pressure Monitoring DEVI 1 each by Does not apply route once a week. (Patient not taking: Reported on 07/05/2022) 1 each 0    cyclobenzaprine (FLEXERIL) 10 MG tablet Take 1 tablet (10 mg total) by mouth 2 (two) times daily as needed for muscle spasms. 30 tablet 0 Unknown   loratadine (CLARITIN) 10 MG tablet Take 1 tablet (10 mg total) by mouth daily. 30 tablet 1 Unknown   NIFEdipine (PROCARDIA XL) 30 MG 24 hr tablet Take 1 tablet (30 mg total) by mouth daily. 30 tablet 2    promethazine (PHENERGAN) 25 MG suppository Place 1 suppository (25 mg total)  rectally every 6 (six) hours as needed for nausea, vomiting or refractory nausea / vomiting. 30 suppository 1      Review of Systems   All systems reviewed and negative except as stated in HPI  Last menstrual period 10/24/2021. General appearance: alert and no distress Lungs: No increased work of breathing Extremities: no sign of DVT Presentation: cephalic Fetal monitoringBaseline: 135 bpm, Variability: Good {> 6 bpm), Accelerations: Reactive, and Decelerations: Absent Uterine activityFrequency: Every 5 minutes Dilation: 2 Effacement (%): 40, 50 Station: -2 Exam by:: Wynelle Bourgeois, CNM   Prenatal labs: ABO, Rh: --/--/O POS (07/20 0105) Antibody: NEG (07/20 0105) Rubella: 1.81 (12/20 1023) RPR: Non Reactive (05/18 0905)  HBsAg: Negative (12/20 1023)  HIV: Non Reactive (05/18 0905)  GBS: Negative/-- (07/13 1544)  1 hr Glucola: wnl Genetic screening  low risk NIPS, + carrier SMA risk, carrier alpha thal Anatomy US: wnl  Prenatal Transfer Tool  Maternal Diabetes: No Genetic Screening: Abnormal:  Results: Other: + carrier SNA risk, carrier alpha thal Maternal Ultrasounds/Referrals: IUGR Fetal Ultrasounds or other Referrals:  None Maternal Substance Abuse:  No Significant Maternal Medications:  Meds include: Other: Procardia Significant Maternal Lab Results: Group B Strep negative  Results for orders placed or performed during the hospital encounter of 07/12/22 (from the past 24 hour(s))  Type and screen   Collection Time: 07/12/22  1:05 AM  Result Value Ref Range   ABO/RH(D) O POS    Antibody Screen NEG    Sample Expiration      07/15/2022,2359 Performed at [facility], [address] [place], [place] [address]   CBC   Collection Time: 07/12/22  1:47 AM  Result Value Ref Range   WBC 8.9 4.0 - 10.5 K/uL   RBC 3.98 3.87 - 5.11 MIL/uL   Hemoglobin 11.3 (L) 12.0 - 15.0 g/dL   HCT 80.9 (L) 98.3 - 38.2 %   MCV 90.2 80.0 - 100.0 fL   MCH 28.4 26.0 - 34.0 pg    MCHC 31.5 30.0 - 36.0 g/dL   RDW 50.5 39.7 - 67.3 %   Platelets 249 150 - 400 K/uL   nRBC 0.0 0.0 - 0.2 %  Comprehensive metabolic panel   Collection Time: 07/12/22  1:47 AM  Result Value Ref Range   Sodium 134 (L) 135 - 145 mmol/L   Potassium 3.9 3.5 - 5.1 mmol/L   Chloride 106 98 - 111 mmol/L   CO2 18 (L) 22 -  32 mmol/L   Glucose, Bld 101 (H) 70 - 99 mg/dL   BUN 7 6 - 20 mg/dL   Creatinine, Ser 6.30 0.44 - 1.00 mg/dL   Calcium 9.9 8.9 - 16.0 mg/dL   Total Protein 6.9 6.5 - 8.1 g/dL   Albumin 2.9 (L) 3.5 - 5.0 g/dL   AST 21 15 - 41 U/L   ALT 15 0 - 44 U/L   Alkaline Phosphatase 172 (H) 38 - 126 U/L   Total Bilirubin 0.4 0.3 - 1.2 mg/dL   GFR, Estimated >10 >93 mL/min   Anion gap 10 5 - 15    Patient Active Problem List   Diagnosis Date Noted   IUGR (intrauterine growth restriction) affecting care of mother 07/11/2022   AMA (advanced maternal age) multigravida 35+, third trimester 05/10/2022   Food insecurity 05/10/2022   Asthma affecting pregnancy in first trimester 05/10/2022   BMI 36.0-36.9,adult 05/10/2022   Obesity in pregnancy 05/10/2022   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 G5P3013 at [redacted]w[redacted]d here for IOL d/t IUGR, 7% EFW with normal dopplers.   #Labor: Pit #Pain: Epidural #FWB: Cat 1 #ID:  GBS neg #MOF: breast #MOC: vasectomy (done) #Circ:     Mariah Milling, Medical Student  07/12/2022, 3:09 AM   Attestation:  I confirm that I have verified the information documented in the  Medical Student 's note and that I have also personally reperformed the physical exam and all medical decision making activities.  The patient was seen and examined by me also Agree with note,  History and physical exam performed by me  also.   IOL scheduled for Chronic Hypertension.  Hypertension well controlled on Procardia NST reactive and reassuring UCs as listed Cervical exams as listed in note  Aviva Signs, CNM

## 2022-07-12 NOTE — Progress Notes (Signed)
Susan Crawford MRN: [id number]  Subjective: -Care assumed of 36 y.o. [id number] at [redacted]w[redacted]d who presents for IOL d/t IUGR. Pregnancy and medical history significant for cHTN, AMA.  In room to meet acquaintance of patient and family.  Patient coping well with contractions.  Reports desire for epidural, but not currently. States she takes labetalol 200mg  and has bottle verifying this. Requests morning medications and cervical exam.    Objective: BP (!) 150/106   Pulse 92   Temp 98.1 F (36.7 C) (Oral)   Resp 16   LMP 10/24/2021 (Exact Date)  No intake/output data recorded. No intake/output data recorded.  Fetal Monitoring: FHT: 135 bpm, Mod Var, -Decels, +Accels UC: Occasional    Vaginal Exam: SVE:   Dilation: 4.5 Effacement (%): 70 Station: -2 Exam by:: [email or web address], RN Membranes:Intact Internal Monitors: None  Augmentation/Induction: Pitocin:42mUn/min Cytotec: None  Assessment:  IUP at 37.2 weeks Cat I FT  IOL IUGR CHTN   Plan: -BP elevated, but not severe.  -Labetalol 200mg  BID ordered -Okay for epidural when desired. -Agreeable to cervical exam around 10am. -Continue other mgmt as ordered   11m, CNM Advanced Practice Provider, Center for [facility] 07/12/2022, 8:49 AM

## 2022-07-12 NOTE — Progress Notes (Signed)
Patient ID: Susan Crawford, female   DOB: 1986-11-03, 36 y.o.   MRN: [id number] RN notified me of elevated blood pressure Vitals:   07/12/22 1750 07/12/22 1805 07/12/22 1838 07/12/22 2021  BP: (!) 138/102 (!) 146/90 (!) 154/94 140/86  Pulse: (!) 112 (!) 104 99 (!) 110  Resp:   16 18  Temp:   98.2 F (36.8 C) 98.7 F (37.1 C)  TempSrc:   Oral Axillary  SpO2:   100% 100%   Currently on Labetalol 200mg   Will discontinue that and add Procardia XL 30mg  daily and Lasix 20mg  daily  Recheck later tonight May need to increase dosage of Procardia XL

## 2022-07-12 NOTE — Progress Notes (Signed)
Labor Progress Note Susan Crawford is a 36 y.o. [id number] at [redacted]w[redacted]d presented for IOL d/t IUGR, pregnancy complicated by AMA and cHTN.  S: Doing well, states that she feels some CTXs but not all of them. Pitocin currently at 8. Desires an epidural.   O:  BP (!) 155/104   Pulse (!) 102   Temp 98.1 F (36.7 C) (Oral)   Resp 18   LMP 10/24/2021 (Exact Date)  EFM: 135 bpm/moderate variability/+accels, no decels  CVE: Dilation: 6 Effacement (%): 70 Cervical Position: Middle Station: -2 Exam by: Darletta Moll, MD  A&P: 36 y.o. [phone] [redacted]w[redacted]d IOL for IUGR, pregnancy complicated by AMA and cHTN. #Labor: Progressing well. Continue to titrate pitocin. Plan for recheck in 4 hrs or sooner as indicated. #Pain: PRN, desires epidural around noon #FWB: Cat 1 #GBS negative #cHTN: Protein creatinine ratio 0.06, LFTs normal, platelets normal. S/p treatment with Labetalol for elevated BP. Continue to monitor. Epidural will likely provide therapeutic hypotension.  Raylene Everts, MD 10:06 AM

## 2022-07-12 NOTE — Lactation Note (Signed)
This note was copied from a baby's chart. Lactation Consultation Note  Patient Name: Susan Crawford [id number] Date: 07/12/2022 Reason for consult: L&D Initial assessment;Early term 37-38.6wks;Breastfeeding assistance;Other (Comment) (LC L/D visit @ 35 mins PP. Dad holding baby when LC arrived. Per mom the baby had latched and fed 5 mins. Baby rooting, LC offered to latch again. Mom  independent , latched and still feeding. Mom aware she will be seen on MBU by [facility] Age: 23 mins PP - Latch score 9      Maternal Data Does the patient have breastfeeding experience prior to this delivery?: Yes How long did the patient breastfeed?: per mom BF X3 kids - range 6 months to 1-1/2 years  Feeding Mother's Current Feeding Choice: Breast Milk  LATCH Score Latch: Grasps breast easily, tongue down, lips flanged, rhythmical sucking.  Audible Swallowing: A few with stimulation  Type of Nipple: Everted at rest and after stimulation  Comfort (Breast/Nipple): Soft / non-tender  Hold (Positioning): No assistance needed to correctly position infant at breast.  LATCH Score: 9   Lactation Tools Discussed/Used    Interventions Interventions: Breast feeding basics reviewed;Education  Discharge    Consult Status Consult Status: Follow-up from L&D Date: 07/12/22 Follow-up type: In-patient    Matilde Sprang Starla Deller 07/12/2022, 5:44 PM

## 2022-07-12 NOTE — Discharge Summary (Cosign Needed Addendum)
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 yo [id number] admitted to the hospital 07/12/2022 for induction of labor.  Indication for induction:  IUGR with CHTN on Meds . She received a foley bulb, cytotec, and pitocin.  She received an epidural and after AROM progressed to complete.  She delivered without complications.  Patient had an uncomplicated labor course as follows: Membrane Rupture Time/Date: 3:12 PM ,07/12/2022   Delivery Method:Vaginal, Spontaneous  Episiotomy: None  Lacerations:  None  Details of delivery can be found in separate delivery note.  Patient had a routine postpartum course. Patient is discharged home 07/13/22.  Newborn Data: Birth date:07/12/2022  Birth time:4:56 PM  Gender:Female  Living status:Living  Apgars:9 ,9 -[name] Weight:2920 g   Magnesium Sulfate received: No BMZ received: No Rhophylac:No MMR:No T-DaP:Given  prenatally Flu: No Transfusion:No  Physical exam  Vitals:   07/13/22 0455 07/13/22 0919 07/13/22 1209 07/13/22 1852  BP: 136/83 (!) 141/84 (!) 149/97 133/78  Pulse: 93 91 85 93  Resp: 18 18 20    Temp: 98.1 F (36.7 C) 97.6 F (36.4 C) 98.3 F (36.8 C)   TempSrc: Oral     SpO2: 100%      General: alert, cooperative, and no distress Lochia: appropriate Uterine Fundus: firm Incision: N/A DVT Evaluation: No evidence of DVT seen on physical exam. No cords or calf tenderness. No significant calf/ankle edema. Labs: Lab Results  Component Value Date   WBC 18.0 (H) 07/13/2022   HGB 10.8 (L) 07/13/2022   HCT 33.6 (L) 07/13/2022   MCV 88.2 07/13/2022   PLT 200 07/13/2022      Latest Ref Rng & Units 07/12/2022    1:47 AM  CMP  Glucose 70 - 99 mg/dL 101   BUN 6 - 20 mg/dL 7   Creatinine 0.44 - 1.00 mg/dL 0.71   Sodium 135 - 145 mmol/L 134   Potassium 3.5 - 5.1 mmol/L 3.9   Chloride 98 - 111 mmol/L 106   CO2 22 - 32 mmol/L 18   Calcium 8.9 - 10.3 mg/dL 9.9   Total Protein 6.5 - 8.1 g/dL 6.9   Total Bilirubin 0.3 - 1.2 mg/dL 0.4   Alkaline Phos 38 - 126 U/L 172   AST 15 - 41 U/L 21   ALT 0 - 44 U/L 15  Edinburgh Score:     No data to display           After visit meds:  Allergies as of 07/13/2022   No Known Allergies      Medication List     STOP taking these medications    acetaminophen 500 MG tablet Commonly known as: TYLENOL   albuterol 108 (90 Base) MCG/ACT inhaler Commonly known as: VENTOLIN HFA   aspirin EC 81 MG tablet   Blood Pressure Monitoring Devi   cyclobenzaprine 10 MG tablet Commonly known as: FLEXERIL   labetalol 200 MG tablet Commonly known as: NORMODYNE   loratadine 10 MG tablet Commonly known as: CLARITIN   pantoprazole 40 MG tablet Commonly known as: Protonix   promethazine 25 MG suppository Commonly known as: PHENERGAN       TAKE these medications    furosemide 20 MG tablet Commonly known as: Lasix Take 1  tablet (20 mg total) by mouth daily for 5 days.   ibuprofen 600 MG tablet Commonly known as: ADVIL Take 1 tablet (600 mg total) by mouth every 6 (six) hours.   NIFEdipine 30 MG 24 hr tablet Commonly known as: ADALAT CC Take 1 tablet (30 mg total) by mouth 2 (two) times daily. Start taking on: July 14, 2022 What changed: when to take this   PrePLUS 27-1 MG Tabs Take 1 tablet by mouth daily.         Discharge home in stable condition Infant Feeding: Breast Infant Disposition:home with mother Discharge instruction: per After Visit Summary and Postpartum booklet. Activity: Advance as tolerated. Pelvic rest for 6 weeks.  Diet: routine diet Future Appointments: Future Appointments  Date Time Provider [place]  07/20/2022 10:20 AM [facility] NURSE [facility]  08/24/2022 10:55 AM Manya Silvas, CNM [facility]   Follow up Visit: Message sent 07/12/2022   Please schedule this patient for a Virtual postpartum visit in 4 weeks with the following provider: Any provider. Additional Postpartum F/U:BP check 1 week  High risk pregnancy complicated by: HTN Delivery mode:  Vaginal, Spontaneous  Anticipated Birth Control:   Vasectomy   07/13/2022 Holley Bouche, MD   Attestation of Supervision of Student:  I confirm that I have verified the information documented in the  resident's assessment .  The physical exam, teaching, and POC was completed by another provider prior to the resident's.  I have verified that all services and findings are accurately documented in this student's note; and I agree with management and plan as outlined in the documentation. I have also made any necessary editorial changes.  -Discharge to home with Lasix and procardia.  -Circ completed  Maryann Conners, [place] for [facility] [place] [facility] 07/13/2022 7:54 PM

## 2022-07-12 NOTE — Progress Notes (Signed)
Patient ID: Susan Crawford, female   DOB: 06-29-1986, 36 y.o.   MRN: [id number] Doing well  Foley came out Feeling some pain  Got Benadryl per request to help her sleep  Vitals:   07/12/22 0352 07/12/22 0605 07/12/22 0646 07/12/22 0702  BP: 115/71 (!) 147/93 138/81 136/62  Pulse: 98 (!) 107 99 91  Resp: 16 16 16 16   Temp:  98.1 F (36.7 C)    TempSrc:  Oral      FHR reactive UCs irregula  Dilation: 4.5 Effacement (%): 70 Cervical Position: Middle Station: -2 Presentation: Vertex Exam by:: [email or web address], RN  Will start Pitocin

## 2022-07-13 ENCOUNTER — Other Ambulatory Visit (HOSPITAL_COMMUNITY): Payer: Self-pay

## 2022-07-13 LAB — CBC
HCT: 33.6 % — ABNORMAL LOW (ref 36.0–46.0)
Hemoglobin: 10.8 g/dL — ABNORMAL LOW (ref 12.0–15.0)
MCH: 28.3 pg (ref 26.0–34.0)
MCHC: 32.1 g/dL (ref 30.0–36.0)
MCV: 88.2 fL (ref 80.0–100.0)
Platelets: 200 10*3/uL (ref 150–400)
RBC: 3.81 MIL/uL — ABNORMAL LOW (ref 3.87–5.11)
RDW: 13 % (ref 11.5–15.5)
WBC: 18 10*3/uL — ABNORMAL HIGH (ref 4.0–10.5)
nRBC: 0 % (ref 0.0–0.2)

## 2022-07-13 MED ORDER — NIFEDIPINE ER OSMOTIC RELEASE 30 MG PO TB24
30.0000 mg | ORAL_TABLET | Freq: Once | ORAL | Status: AC
  Administered 2022-07-13: 30 mg via ORAL
  Filled 2022-07-13: qty 1

## 2022-07-13 MED ORDER — NIFEDIPINE ER 30 MG PO TB24
30.0000 mg | ORAL_TABLET | Freq: Two times a day (BID) | ORAL | 0 refills | Status: DC
  Filled 2022-07-13: qty 60, 30d supply, fill #0

## 2022-07-13 MED ORDER — IBUPROFEN 600 MG PO TABS
600.0000 mg | ORAL_TABLET | Freq: Four times a day (QID) | ORAL | 0 refills | Status: AC
  Filled 2022-07-13: qty 30, 8d supply, fill #0

## 2022-07-13 MED ORDER — NIFEDIPINE ER OSMOTIC RELEASE 30 MG PO TB24: 30.0000 mg | ORAL_TABLET | Freq: Two times a day (BID) | ORAL | Status: DC

## 2022-07-13 MED ORDER — FUROSEMIDE 20 MG PO TABS
20.0000 mg | ORAL_TABLET | Freq: Every day | ORAL | 0 refills | Status: DC
  Filled 2022-07-13: qty 5, 5d supply, fill #0

## 2022-07-13 NOTE — Anesthesia Postprocedure Evaluation (Signed)
Anesthesia Post Note  Patient: [name]  Procedure(s) Performed: AN AD HOC LABOR EPIDURAL     Patient location during evaluation: Mother Baby Anesthesia Type: Epidural Level of consciousness: awake and alert Pain management: pain level controlled Vital Signs Assessment: post-procedure vital signs reviewed and stable Respiratory status: spontaneous breathing, nonlabored ventilation and respiratory function stable Cardiovascular status: stable Postop Assessment: no headache, no backache, epidural receding, no apparent nausea or vomiting, patient able to bend at knees, adequate PO intake and able to ambulate Anesthetic complications: no   No notable events documented.  Last Vitals:  Vitals:   07/13/22 0000 07/13/22 0455  BP: 133/85 136/83  Pulse: 97 93  Resp: 19 18  Temp: 37.5 C 36.7 C  SpO2: 100% 100%    Last Pain:  Vitals:   07/13/22 0455  TempSrc: Oral  PainSc: 0-No pain   Pain Goal:                   [place]

## 2022-07-13 NOTE — Lactation Note (Signed)
This note was copied from a baby's chart. Lactation Consultation Note  Patient Name: Susan Crawford Date: 07/13/2022   Age:37 hours Per RN [name]) mom declined [facility] services tonight would like to be seen in the morning.  Maternal Data    Feeding    LATCH Score                    Lactation Tools Discussed/Used    Interventions    Discharge    Consult Status      [name] 07/13/2022, 2:07 AM

## 2022-07-13 NOTE — Lactation Note (Signed)
This note was copied from a baby's chart. Lactation Consultation Note  Patient Name: Susan Crawford [id number] Date: 07/13/2022 Reason for consult: Follow-up assessment;Early term 37-38.6wks;Infant < 6lbs Age:37 hours  P4, Mother states she plans to breast and formula feed. Set up DEBP and mother pump for 15 min.  Reviewed hand expression and assisted with latching. Baby sucked with stimulation.  Helped guide baby deep on breast.   Maternal Data Has patient been taught Hand Expression?: Yes Does the patient have breastfeeding experience prior to this delivery?: Yes  Feeding Mother's Current Feeding Choice: Breast Milk and Formula  LATCH Score Latch: Repeated attempts needed to sustain latch, nipple held in mouth throughout feeding, stimulation needed to elicit sucking reflex.  Audible Swallowing: A few with stimulation  Type of Nipple: Everted at rest and after stimulation  Comfort (Breast/Nipple): Soft / non-tender  Hold (Positioning): Assistance needed to correctly position infant at breast and maintain latch.  LATCH Score: 7   Lactation Tools Discussed/Used Tools: Pump;Flanges Flange Size: 24 Breast pump type: Double-Electric Breast Pump;Manual Pump Education: Setup, frequency, and cleaning;Milk Storage Reason for Pumping: stimulationa nd supplmentation Pumping frequency: after formula feeding  Interventions Interventions: Education  Discharge Pump: Advised to call insurance company  Consult Status Consult Status: Follow-up Date: 07/14/22 Follow-up type: In-patient    Dahlia Byes [facility] 07/13/2022, 10:43 AM

## 2022-07-13 NOTE — Progress Notes (Signed)
Patient ID: Susan Crawford, female   DOB: 1986-11-12, 36 y.o.   MRN: [id number] Post Partum Day 1 Subjective: no complaints, up ad lib, voiding, tolerating PO, and + flatus  Objective: Blood pressure 136/83, pulse 93, temperature 98.1 F (36.7 C), temperature source Oral, resp. rate 18, last menstrual period 10/24/2021, SpO2 100 %, unknown if currently breastfeeding.  Physical Exam:  General: alert and cooperative Lochia: appropriate Uterine Fundus: firm DVT Evaluation: No evidence of DVT seen on physical exam.  Recent Labs    07/12/22 1217 07/13/22 0458  HGB 10.9* 10.8*  HCT 33.9* 33.6*    Assessment/Plan: Plan for discharge tomorrow and Circumcision prior to discharge   LOS: 1 day   Gae Gallop, Medical Student 07/13/2022, 7:39 AM

## 2022-07-13 NOTE — Progress Notes (Signed)
Post Partum Day 1 Subjective: no complaints, up ad lib, voiding, and tolerating PO  Objective: Blood pressure 136/83, pulse 93, temperature 98.1 F (36.7 C), temperature source Oral, resp. rate 18, last menstrual period 10/24/2021, SpO2 100 %, unknown if currently breastfeeding.  Physical Exam:  General: alert, cooperative, and no distress Lochia: appropriate Uterine Fundus: firm Incision: n/a DVT Evaluation: No evidence of DVT seen on physical exam.  Recent Labs    07/12/22 1217 07/13/22 0458  HGB 10.9* 10.8*  HCT 33.9* 33.6*    Assessment/Plan: Plan for discharge tomorrow and Breastfeeding   LOS: 1 day   Wynelle Bourgeois, CNM 07/13/2022, 8:41 AM

## 2022-07-13 NOTE — Social Work (Signed)
CSW received consult for food insecurity. CSW met with MOB to offer support and complete assessment.    CSW entered the room and introduced CSW role and reason for visit. MOB was agreeable to visit. CSW inquired about how MOB was feeling since giving birth, MOB reported feeling good. CSW inquired about the noted food insecurity. MOB stated "I need help getting food stamps". CSW provided MOB with the number and website to apply for SNAP benefits. CSW also provided a list of resources within the community for MOB to get free food. MOB reported she receives WIC. CSW encouraged MOB to call to get the infant added to her benefits. CSW inquired about other items or resources needed. MOB stated they have all other necessary items.   CSW provided education regarding the baby blues period vs. perinatal mood disorders, discussed treatment and gave resources for mental health follow up if concerns arise.  CSW recommends self-evaluation during the postpartum time period using the New Mom Checklist from Postpartum Progress and encouraged MOB to contact a medical professional if symptoms are noted at any time.    MOB reported they will be using [facility] for infants follow up care.CSW provided review of Sudden Infant Death Syndrome (SIDS) precautions.    CSW identifies no further need for intervention and no barriers to discharge at this time.   Catheryn Slifer Clinical Social Worker [phone] 

## 2022-07-19 ENCOUNTER — Other Ambulatory Visit: Payer: Medicaid Other

## 2022-07-19 ENCOUNTER — Encounter: Payer: Medicaid Other | Admitting: Family Medicine

## 2022-07-20 ENCOUNTER — Telehealth (HOSPITAL_COMMUNITY): Payer: Self-pay | Admitting: *Deleted

## 2022-07-20 ENCOUNTER — Ambulatory Visit: Payer: Medicaid Other

## 2022-07-20 NOTE — Telephone Encounter (Signed)
Mom reports feeling good. No concerns about herself at this time. EPDS in hospital not found.Mom is in hurry for appt right now, but reports she is feeling well emotionally. Mom reports baby is doing well. Feeding, peeing, and pooping without difficulty. Safe sleep reviewed. Mom reports no concerns about baby at present.  Duffy Rhody, RN 07-20-2022 at 10:23am

## 2022-07-24 ENCOUNTER — Ambulatory Visit (INDEPENDENT_AMBULATORY_CARE_PROVIDER_SITE_OTHER): Payer: Medicaid Other

## 2022-07-24 ENCOUNTER — Other Ambulatory Visit: Payer: Self-pay

## 2022-07-24 VITALS — BP 132/95 | HR 101 | Wt 204.7 lb

## 2022-07-24 DIAGNOSIS — I10 Essential (primary) hypertension: Secondary | ICD-10-CM

## 2022-07-24 DIAGNOSIS — Z0130 Encounter for examination of blood pressure without abnormal findings: Secondary | ICD-10-CM

## 2022-07-24 MED ORDER — BLOOD PRESSURE KIT DEVI: 1.0000 | Freq: Once | 0 refills | Status: AC

## 2022-07-24 MED ORDER — BLOOD PRESSURE KIT DEVI: 1.0000 | Freq: Once | 0 refills | Status: DC

## 2022-07-24 MED ORDER — NIFEDIPINE ER OSMOTIC RELEASE 90 MG PO TB24: 90.0000 mg | ORAL_TABLET | Freq: Every day | ORAL | 5 refills | Status: DC

## 2022-07-24 NOTE — Progress Notes (Signed)
Blood Pressure Check Visit  Susan Crawford is here for blood pressure check following vaginal delivery on 07/12/22 with history of chronic hypertension. Discharged on nifedipine 30 mg BID. Patient reports taking twice daily as prescribed; last taken today at 9 AM. BP today is 132/95. Home cuff reading is 143/107. Reviewed that home cuff is not accurate and should not be used. New BP cuff sent to pharmacy. Assisted pt to enroll in Babyscripts postpartum hypertension monitoring. Pt reports blurry vision 2 days prior. Denies any headaches or dizziness.   Reviewed with Crissie Reese, MD who recommends changing dosage to nifedipine 90 mg once daily. Pt to return in 1 week for BP check. Reviewed provider recommendation with patient. Reviewed symptoms of elevated BP. Encouraged pt to check BP and log into Babyscripts app once BP cuff has been delivered.   Marjo Bicker, RN 07/24/2022  3:05 PM

## 2022-07-25 ENCOUNTER — Telehealth: Payer: Self-pay | Admitting: General Practice

## 2022-07-25 NOTE — Telephone Encounter (Signed)
Patient called and left message on nurse voicemail line stating she had an appt yesterday and a new prescription for a blood pressure cuff was sent to [facility]. Patient states the pharmacy is saying she doesn't have medicaid insurance but she does.   Called [facility] and asked for clarifying information about prescription. [facility] states the prescription is getting denied because medicaid thinks she has a Social research officer, government. Called patient and discussed with her. Patient states she has no other insurance. She reports having tricare previously but lost it in 2021. Told patient she will need to call her medicaid insurance plan and let them know she has no other coverage then there shouldn't be a problem with the prescription. Patient verbalized understanding.

## 2022-07-26 ENCOUNTER — Encounter: Payer: Self-pay | Admitting: Obstetrics and Gynecology

## 2022-07-26 NOTE — Progress Notes (Signed)
This encounter was created in error - please disregard.  This encounter was created in error - please disregard.

## 2022-07-27 ENCOUNTER — Telehealth: Payer: Self-pay

## 2022-07-27 NOTE — Telephone Encounter (Signed)
[place], Babyscripts  P [facility]-Bab[facility]s Htn Md; P [facility] Clinical Pool Babyscripts Trigger Notification for [place], [place]  Trigger Severity: Elevated  Blood Pressure Value: 126/94  Reading Date: 2022-07-26  Symptoms: Headache   Addressed by Para March, MD.

## 2022-07-31 ENCOUNTER — Ambulatory Visit (INDEPENDENT_AMBULATORY_CARE_PROVIDER_SITE_OTHER): Payer: Medicaid Other

## 2022-07-31 ENCOUNTER — Other Ambulatory Visit: Payer: Self-pay

## 2022-07-31 VITALS — BP 122/90 | HR 91 | Wt 201.3 lb

## 2022-07-31 DIAGNOSIS — Z0130 Encounter for examination of blood pressure without abnormal findings: Secondary | ICD-10-CM

## 2022-07-31 NOTE — Progress Notes (Signed)
Blood Pressure Check Visit   Susan Crawford is here for blood pressure check following vaginal delivery on 07/12/22 with history of chronic hypertension. Discharged on nifedipine 30 mg BID. Seen for BP check on 07/24/22 and medication changed to nifedipine 90 mg daily. Reports taking nifedipine last this AM. BP today is: 122/90. Denies any s/s of elevated BP.   Babyscripts values:  Date Added mmHg Symptoms  07/27/22 128/106 No Symptoms  07/26/22 126/94 Headache  07/26/22 148/106 Headache   Reviewed with Alvester Morin, MD who states patient may continue medication as prescribed and return for PP visit on 08/24/22. Reviewed provider recommendation and return precautions with patient.   Marjo Bicker, RN 07/31/2022  2:02 PM

## 2022-08-24 ENCOUNTER — Encounter: Payer: Self-pay | Admitting: Advanced Practice Midwife

## 2022-08-24 ENCOUNTER — Other Ambulatory Visit: Payer: Self-pay

## 2022-08-24 ENCOUNTER — Encounter: Payer: Self-pay | Admitting: Family Medicine

## 2022-08-24 ENCOUNTER — Ambulatory Visit (INDEPENDENT_AMBULATORY_CARE_PROVIDER_SITE_OTHER): Payer: Medicaid Other | Admitting: Advanced Practice Midwife

## 2022-08-24 VITALS — BP 119/72 | HR 75

## 2022-08-24 DIAGNOSIS — N393 Stress incontinence (female) (male): Secondary | ICD-10-CM

## 2022-08-24 NOTE — Progress Notes (Unsigned)
Post Partum Visit Note  Susan Crawford is a 36 y.o. [id number] female who presents for a postpartum visit. She is 6 weeks postpartum following a normal spontaneous vaginal delivery.  I have fully reviewed the prenatal and intrapartum course. The delivery was at [redacted]w[redacted]d gestational weeks.  Anesthesia: epidural. Postpartum course has been uncomplicated. Baby is doing well. Baby is feeding by both breast and bottle - Similac Advance. Bleeding no bleeding. Bowel function is normal. Bladder function is normal. Patient is not sexually active. Contraception method is vasectomy. Postpartum depression screening: negative.   The pregnancy intention screening data noted above was reviewed. Potential methods of contraception were discussed. The patient elected to proceed with No data recorded.   Edinburgh Postnatal Depression Scale - 08/24/22 1110       Edinburgh Postnatal Depression Scale:  In the Past 7 Days   I have been able to laugh and see the funny side of things. 0    I have looked forward with enjoyment to things. 0    I have blamed myself unnecessarily when things went wrong. 0    I have been anxious or worried for no good reason. 0    I have felt scared or panicky for no good reason. 0    Things have been getting on top of me. 0    I have been so unhappy that I have had difficulty sleeping. 0    I have felt sad or miserable. 0    I have been so unhappy that I have been crying. 0    The thought of harming myself has occurred to me. 0    Edinburgh Postnatal Depression Scale Total 0             Health Maintenance Due  Topic Date Due   COVID-19 Vaccine (3 - Moderna series) 06/01/2020    The following portions of the patient's history were reviewed and updated as appropriate: allergies, current medications, past family history, past medical history, past social history, past surgical history, and problem list.  Review of Systems Pertinent items are noted in HPI.  Objective:  BP  119/72   Pulse 75   LMP 10/24/2021 (Exact Date)   Breastfeeding Yes    General:  alert, cooperative, appears stated age, and no distress   Breasts:  not indicated  Lungs: Nml rate and effort  Heart:  Nml rate  Abdomen: soft, non-tender; bowel sounds normal; no masses,  no organomegaly   Wound NA  GU exam:  not indicated       Assessment:    There are no diagnoses linked to this encounter.  *** postpartum exam.   Plan:   Essential components of care per ACOG recommendations:  1.  Mood and well being: Patient with negative depression screening today. Reviewed local resources for support.  - Patient tobacco use? No.   - hx of drug use? No.    2. Infant care and feeding:  -Patient currently breastmilk feeding? Yes. Discussed returning to work and pumping.  -Social determinants of health (SDOH) reviewed in EPIC. No concerns***The following needs were identified***  3. Sexuality, contraception and birth spacing - Patient does not want a pregnancy in the next year.  Desired family size is 4 children.  - Reviewed reproductive life planning. Reviewed contraceptive methods based on pt preferences and effectiveness.  Patient desired Vasectomy today.   - Discussed birth spacing of 18 months  4. Sleep and fatigue -Encouraged family/partner/community support of 4 hrs  of uninterrupted sleep to help with mood and fatigue  5. Physical Recovery  - Discussed patients delivery and complications. She describes her labor as good. - Patient had a Vaginal, no problems at delivery. Patient had a  NOne  laceration. Perineal healing reviewed. Patient expressed understanding - Patient has urinary incontinence? Yes. Discussed role of pelvic floor PT. Offered PT and patient accepted. Patient was referred to pelvic floor PT.  - Patient is safe to resume physical and sexual activity  6.  Health Maintenance - HM due items addressed Yes - Last pap smear  Diagnosis  Date Value Ref Range Status   04/07/2020   Final   - Negative for intraepithelial lesion or malignancy (NILM)   Pap smear not done at today's visit.  -Breast Cancer screening indicated? No.   7. Chronic Disease/Pregnancy Condition follow up: Hypertension  - PCP follow up  Dorathy Kinsman, CNM [facility] [employer]

## 2022-08-27 ENCOUNTER — Encounter: Payer: Self-pay | Admitting: Advanced Practice Midwife

## 2022-09-27 ENCOUNTER — Ambulatory Visit: Payer: Medicaid Other | Admitting: Physical Therapy

## 2022-10-03 ENCOUNTER — Other Ambulatory Visit: Payer: Self-pay

## 2022-10-03 ENCOUNTER — Ambulatory Visit: Payer: Medicaid Other | Attending: Advanced Practice Midwife | Admitting: Physical Therapy

## 2022-10-03 DIAGNOSIS — R279 Unspecified lack of coordination: Secondary | ICD-10-CM | POA: Insufficient documentation

## 2022-10-03 DIAGNOSIS — N393 Stress incontinence (female) (male): Secondary | ICD-10-CM | POA: Insufficient documentation

## 2022-10-03 DIAGNOSIS — R293 Abnormal posture: Secondary | ICD-10-CM | POA: Insufficient documentation

## 2022-10-03 DIAGNOSIS — M6281 Muscle weakness (generalized): Secondary | ICD-10-CM | POA: Diagnosis not present

## 2022-10-03 NOTE — Therapy (Signed)
OUTPATIENT PHYSICAL THERAPY FEMALE PELVIC EVALUATION   Patient Name: Susan Crawford MRN: [id number] DOB:May 20, 1986, 36 y.o., female Today's Date: 10/03/2022   PT End of Session - 10/03/22 1359     Visit Number 1    Date for PT Re-Evaluation 01/03/23    Authorization Type [employer]    PT Start Time 1400    PT Stop Time 1430    PT Time Calculation (min) 30 min    Activity Tolerance Patient tolerated treatment well    Behavior During Therapy [facility] for tasks assessed/performed             Past Medical History:  Diagnosis Date   Asthma    Asthma affecting pregnancy in first trimester 05/10/2022   Hypertension    'goes crazy with pregnancies"   Kidney stone 2014   Preterm labor    Past Surgical History:  Procedure Laterality Date   BREAST LUMPECTOMY     Patient Active Problem List   Diagnosis Date Noted   Food insecurity 05/10/2022   BMI 36.0-36.9,adult 05/10/2022   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 03/15/2022   Alpha thalassemia silent carrier 03/15/2022   Chronic hypertension affecting pregnancy 12/06/2021   ASTHMA, PERSISTENT 09/25/2010   Seasonal allergies 10/18/2008    PCP: Orvis Brill, DO  REFERRING PROVIDER: Manya Silvas, CNM   REFERRING DIAG: N39.3 (ICD-10-CM) - Primary stress urinary incontinence Z39.2 (ICD-10-CM) - Postpartum care and examination   THERAPY DIAG:  Muscle weakness (generalized)  Abnormal posture  Unspecified lack of coordination  Rationale for Evaluation and Treatment Rehabilitation  ONSET DATE: after second baby  SUBJECTIVE:                                                                                                                                                                                           SUBJECTIVE STATEMENT: 8 weeks postpartum via vaginal birth, fourth baby. "I feel like my bladder is awful". Worsening with every baby started after second baby. Pt reports reports she has urinary leakage with  sneezing/laughing/coughing, exercising, squatting, lifting, intercourse. Other children at 65, 4, 49 yo. Did have leakage with pregnancy and much worse than now but still occurring. 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   Fluid intake: Yes: breastfeeding and drinking a lot of water right now     PAIN:  Are you having pain? No   PRECAUTIONS: Other: postpartum  WEIGHT BEARING RESTRICTIONS No  FALLS:  Has patient fallen in last 6 months? 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  PLOF: Independent  PATIENT GOALS to have less leakage  PERTINENT HISTORY:  [id number], x4 vaginal births, HTN Sexual abuse: No  BOWEL MOVEMENT Pain with bowel movement: No Type of bowel movement:Type (Bristol Stool Scale) 2-3, Frequency 1-2x per day, and Strain Yes sometimes with harder stools Fully empty rectum: Yes:   Leakage: No Pads: No Fiber supplement: Yes: stool softener  URINATION Pain with urination: No Fully empty bladder: Yes:   Stream: Strong Urgency: No Frequency: every hour - 2 hours, 2x per night without any weakness Leakage: Walking to the bathroom, Coughing, Sneezing, Laughing, Exercise, Lifting, and Intercourse Pads: Yes: liner, pose pad 3 changes per night  INTERCOURSE Pain with intercourse:  not painful (first time postpartum was but this resolved) Ability to have vaginal penetration:  Yes:   Climax: not painful, denies dryness Marinoff Scale: 0/3  PREGNANCY Vaginal deliveries 4 Tearing No C-section deliveries 0 Currently pregnant No  PROLAPSE None    OBJECTIVE:   DIAGNOSTIC FINDINGS:  PATIENT SURVEYS:   (UDI-6 Short Form) Score = 50  COGNITION:  Overall cognitive status: Within functional limits for tasks assessed     SENSATION:  Light touch: Appears intact  Proprioception: Appears intact  MUSCLE LENGTH: Bil hamstrings and adductors limited  by 25%  LUMBAR SPECIAL TESTS:  Stork standing: Positive, SI Compression/distraction test: (+) for pain with distraction and felt better with compression, FABER test: Positive, and Gaenslen's test: Positive *all positives for pain at Rt side                POSTURE: rounded shoulders, forward head, and anterior pelvic tilt   LUMBARAROM/PROM  A/PROM A/PROM  eval  Flexion Limited by 25%  Extension WFL  Right lateral flexion Limited by 25%  Left lateral flexion Limited by 25%  Right rotation Limited by 25%  Left rotation Limited by 25%   (Blank rows = not tested)  LOWER EXTREMITY ROM:  WFL  LOWER EXTREMITY MMT:  Rt hip abduction 3/5 with pain, ext/flex/add 4+/5; LT hip grossly 4/5 no pain,  knees and ankles 5/5   PALPATION:   General  mild TTP at Rt lower quadrant                External Perineal Exam pt deferred                             Internal Pelvic Floor pt deferred  Patient confirms identification and approves PT to assess internal pelvic floor and treatment No  PELVIC MMT:   MMT eval  Vaginal   Internal Anal Sphincter   External Anal Sphincter   Puborectalis   Diastasis Recti   (Blank rows = not tested)        TONE: pt deferred  PROLAPSE: pt deferred  TODAY'S TREATMENT  EVAL Examination completed, findings reviewed, pt educated on POC, HEP, and bladder irritants. Pt motivated to participate in PT and agreeable to attempt recommendations.     If treatment provided at initial evaluation, no treatment charged due to lack of authorization.       PATIENT EDUCATION:  Education details: [id number] Person educated: Patient Education method: Programmer, multimedia, Demonstration, Actor cues, Verbal cues, and Handouts Education comprehension: verbalized understanding and returned demonstration   HOME EXERCISE PROGRAM: [id number]  ASSESSMENT:  CLINICAL IMPRESSION: Patient is a 36 y.o. female  who was seen today for physical therapy evaluation and treatment for  urinary incontinence postpartum. Pt reports she started having urinary leakage with second pregnancy,  resolved after pregnancy but has worsened with each pregnancy since. Now having leakage with several stressors, sometimes unable to stop urine leakage and does wear liners or pads AAT changing 3x per day due to leakage. Pt does get up 2x per night to urinate but is always dry. Pt also states she has had Rt hip pain since pregnancy with fourth baby and continues postpartum (now 8 weeks). Pt tested (+) with pain for Rt SI instability (distraction, compression, FABER tests). Pt also had pain with Rt hip abduction and Rt hip/low back pain with Lt leg single leg stance. Pt deferred internal testing today requesting to wait of this. Pt also had decreased strength at hips, decreased strength at core, decreased flexibility at spine and bil hips. Pt would benefit from additional PT to further address deficits.     OBJECTIVE IMPAIRMENTS decreased coordination, decreased endurance, decreased mobility, decreased strength, increased fascial restrictions, increased muscle spasms, impaired flexibility, improper body mechanics, postural dysfunction, and pain.   ACTIVITY LIMITATIONS carrying, lifting, squatting, continence, locomotion level, and caring for others  PARTICIPATION LIMITATIONS: interpersonal relationship and community activity  PERSONAL FACTORS Fitness, Time since onset of injury/illness/exacerbation, and 1 comorbidity: x4 vaginal births, postpartum  are also affecting patient's functional outcome.   REHAB POTENTIAL: Good  CLINICAL DECISION MAKING: Stable/uncomplicated  EVALUATION COMPLEXITY: Low   GOALS: Goals reviewed with patient? Yes  SHORT TERM GOALS: Target date: 10/31/2022  Pt to be I with HEP.  Baseline: Goal status: INITIAL  2.  Pt to report improved time between bladder voids to at least 2 hours for improved QOL with decreased urinary frequency.   Baseline:  Goal status:  INITIAL  3.  Pt to demonstrate at least 3/5 pelvic floor strength for improved pelvic stability and decreased strain at pelvic floor/ decrease leakage.  Baseline:  Goal status: INITIAL  4.  Pt will be able to functional actions such as squatting body weight without compensatory strategies and improved coordination of pelvic floor and breathing mechanics without leakage  Baseline:  Goal status: INITIAL   LONG TERM GOALS: Target date:  01/03/23    Pt to be I with advanced HEP.  Baseline:  Goal status: INITIAL  2.  Pt to demonstrate at least 5/5 bil hip strength for improved pelvic stability and functional squats without leakage.  Baseline:  Goal status: INITIAL  3.  Pt to report improved time between bladder voids to at least 3 hours for improved QOL with decreased urinary frequency.   Baseline:  Goal status: INITIAL  4.  Pt to demonstrate at least 4/5 pelvic floor strength for improved pelvic stability and decreased strain at pelvic floor/ decrease leakage.  Baseline:  Goal status: INITIAL  5.  Pt will be able to functional actions such as squatting 20# without compensatory strategies and improved coordination of pelvic floor and breathing mechanics without leakage  Baseline:  Goal status: INITIAL  6.  Pt to be I with voiding and breathing mechanics for decreased strain at pelvic floor with voiding.  Baseline:  Goal status: INITIAL  PLAN: PT FREQUENCY: 1x/week  PT DURATION:  6 sessions  PLANNED INTERVENTIONS: Therapeutic exercises, Therapeutic activity, Neuromuscular re-education, Patient/Family education, Self Care, Joint mobilization, Dry Needling, Spinal mobilization, Cryotherapy, Moist heat, scar mobilization, Taping, Biofeedback, and Manual therapy  PLAN FOR NEXT SESSION: DRA assessment, internal assessment if pt consents, coordination of pelvic floor and breathing mechanics   Stacy Gardner, PT, DPT 10/11/232:43 PM

## 2022-10-03 NOTE — Patient Instructions (Signed)

## 2022-10-15 ENCOUNTER — Ambulatory Visit: Payer: Medicaid Other | Admitting: Physical Therapy

## 2022-10-24 ENCOUNTER — Ambulatory Visit: Payer: Medicaid Other | Attending: Advanced Practice Midwife | Admitting: Physical Therapy

## 2022-10-24 ENCOUNTER — Ambulatory Visit (INDEPENDENT_AMBULATORY_CARE_PROVIDER_SITE_OTHER): Payer: Medicaid Other | Admitting: Student

## 2022-10-24 ENCOUNTER — Encounter: Payer: Self-pay | Admitting: Student

## 2022-10-24 ENCOUNTER — Other Ambulatory Visit (HOSPITAL_COMMUNITY)
Admission: RE | Admit: 2022-10-24 | Discharge: 2022-10-24 | Disposition: A | Discharge status: Home / Self Care | Payer: Medicaid Other | Source: Ambulatory Visit | Attending: Family Medicine | Admitting: Family Medicine

## 2022-10-24 VITALS — BP 123/88 | HR 87 | Temp 98.7°F | Wt 190.6 lb

## 2022-10-24 DIAGNOSIS — B349 Viral infection, unspecified: Secondary | ICD-10-CM | POA: Diagnosis not present

## 2022-10-24 DIAGNOSIS — O10919 Unspecified pre-existing hypertension complicating pregnancy, unspecified trimester: Secondary | ICD-10-CM

## 2022-10-24 DIAGNOSIS — R0683 Snoring: Secondary | ICD-10-CM | POA: Insufficient documentation

## 2022-10-24 DIAGNOSIS — Z113 Encounter for screening for infections with a predominantly sexual mode of transmission: Secondary | ICD-10-CM | POA: Diagnosis present

## 2022-10-24 DIAGNOSIS — R293 Abnormal posture: Secondary | ICD-10-CM | POA: Insufficient documentation

## 2022-10-24 DIAGNOSIS — R279 Unspecified lack of coordination: Secondary | ICD-10-CM | POA: Insufficient documentation

## 2022-10-24 DIAGNOSIS — M6281 Muscle weakness (generalized): Secondary | ICD-10-CM | POA: Diagnosis present

## 2022-10-24 LAB — POCT WET PREP (WET MOUNT)
Clue Cells Wet Prep Whiff POC: NEGATIVE
Trichomonas Wet Prep HPF POC: ABSENT

## 2022-10-24 NOTE — Assessment & Plan Note (Addendum)
BP stable today.  123/88. DC nifedipine. We will monitor blood pressure over the next few weeks. If elevated and not within goal, can consider starting ACE/ARB.

## 2022-10-24 NOTE — Patient Instructions (Addendum)
It was great seeing you today.  We did some testing today and collected labs.  I will call you if any of this is abnormal.  I placed an order for sleep study, you will be contacted to schedule this.  For your sore throat and other symptoms, make sure you drink plenty of fluids, you may use cough lozenges, gargle with warm salt water.  If you develop cough, you may use honey mixed in warm water or tea.   We will plan to see you again next year unless you have any other concerns.   If you have any questions or concerns, please feel free to call the clinic.    Be well,  Dr. Orvis Brill [facility] [phone]

## 2022-10-24 NOTE — Assessment & Plan Note (Signed)
Worsening, had does have OSA symptoms of snoring loudly so the others here, awakening gasping for air, fatigue, headaches. STOP-BANG score of 3-4, intermediate risk of OSA. Ordered split sleep study.

## 2022-10-24 NOTE — Progress Notes (Signed)
    SUBJECTIVE:   CHIEF COMPLAINT / HPI:   HTN Started checking blood pressure at home, getting numbers 120s over 80s. No HA, vision changes. She says her BP "gets bad" in pregnancy, and resolves after. Was on Nifedipine, but stopped taking it a week or so ago.  URI symptoms Had cough, URI a few weeks ago. Used warm salt water rinses.  Throat soreness started yesterday. No fevers, chills. No N/V/D.  Snoring at night Worsening lately. Feels like the more kids she has/weight she has gained, she's snoring. Wakes her up at night. Does wake up  gasping for air. Feels fatigued.  Desire for STI screening LMP: 10/13/22 Not currently using any active form of birth control.  Not interested in starting any contraceptives. No vaginal discharge, itching, odor. Resumed intercourse recently. No pain with intercourse.  PERTINENT  PMH / PSH: Reviewed  OBJECTIVE:   BP 123/88   Pulse 87   Temp 98.7 F (37.1 C) (Oral)   Wt 190 lb 9.6 oz (86.5 kg)   LMP 10/13/2022   SpO2 99%   BMI 31.72 kg/m   General: Well-appearing, no acute distress HEENT: Mucous membranes moist.  Mild tonsillar erythema.  Otherwise, no exudates. CV: Regular rate and rhythm Respiratory: Normal work of breathing on room air, no wheezing or crackles.  Good lung sounds throughout. GU: Chaperone Shari  CMA present. External vulva and vagina nonerythematous, without any obvious lesions or rash.  Moderate amount of yellow tinted discharge appreciated.  Normal ruggae of vaginal walls.  Cervix is non erythematous and non-friable.     ASSESSMENT/PLAN:   Chronic hypertension affecting pregnancy BP stable today.  123/88. DC nifedipine. We will monitor blood pressure over the next few weeks. If elevated and not within goal, can consider starting ACE/ARB.  Viral illness Couple days of sore throat.  Sick contact with kids at home. Well-appearing, afebrile, normal lung sounds.  No concern for bacterial infection,  pneumonia. Discussed supportive care and return precautions.   Screening examination for STD (sexually transmitted disease) Not having any symptoms. Just resumed intercourse. Collected GC, chlamydia, BV/yeast vaginal swabs.  Also collected HIV, RPR. Will let patient know results as they return.  Snoring Worsening, had does have OSA symptoms of snoring loudly so the others here, awakening gasping for air, fatigue, headaches. STOP-BANG score of 3-4, intermediate risk of OSA. Ordered split sleep study.      Orvis Brill, [place]

## 2022-10-24 NOTE — Therapy (Signed)
OUTPATIENT PHYSICAL THERAPY FEMALE PELVIC EVALUATION   Patient Name: Susan Crawford MRN: [id number] DOB:July 01, 1986, 36 y.o., female Today's Date: 10/24/2022   PT End of Session - 10/24/22 1452     Visit Number 2    Date for PT Re-Evaluation 01/03/23    Authorization Type [employer]    PT Start Time 1448    PT Stop Time 1526    PT Time Calculation (min) 38 min    Activity Tolerance Patient tolerated treatment well    Behavior During Therapy [facility] for tasks assessed/performed             Past Medical History:  Diagnosis Date   Asthma    Asthma affecting pregnancy in first trimester 05/10/2022   Hypertension    'goes crazy with pregnancies"   Kidney stone 2014   Preterm labor    Past Surgical History:  Procedure Laterality Date   BREAST LUMPECTOMY     Patient Active Problem List   Diagnosis Date Noted   Viral illness 10/24/2022   Snoring 10/24/2022   Food insecurity 05/10/2022   BMI 36.0-36.9,adult 05/10/2022   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 03/15/2022   Alpha thalassemia silent carrier 03/15/2022   Chronic hypertension affecting pregnancy 12/06/2021   Screening examination for STD (sexually transmitted disease) 11/21/2021   ASTHMA, PERSISTENT 09/25/2010   Seasonal allergies 10/18/2008    PCP: Orvis Brill, DO  REFERRING PROVIDER: Manya Silvas, CNM   REFERRING DIAG: N39.3 (ICD-10-CM) - Primary stress urinary incontinence Z39.2 (ICD-10-CM) - Postpartum care and examination   THERAPY DIAG:  Muscle weakness (generalized)  Unspecified lack of coordination  Rationale for Evaluation and Treatment Rehabilitation  ONSET DATE: after second baby  SUBJECTIVE:                                                                                                                                                                                           SUBJECTIVE STATEMENT: Pt reports she was seeing a lot of improvement with leakage since eval, leakages were very  small if any and not seeing it nearly as often however last week had a strong laugh and had a large leakage of urine into liner. Now urinating about every 2 hours but does have increased urge at 2 hour mark. No leakage with intercourse and not with urgency since eval.   Fluid intake: Yes: breastfeeding and drinking a lot of water right now     PAIN:  Are you having pain? No   PRECAUTIONS: Other: postpartum  WEIGHT BEARING RESTRICTIONS No  FALLS:  Has patient fallen in last 6 months? 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  PLOF: Independent  PATIENT GOALS to have less leakage  PERTINENT HISTORY:  [id number], x4 vaginal births, HTN Sexual abuse: No  BOWEL MOVEMENT Pain with bowel movement: No Type of bowel movement:Type (Bristol Stool Scale) 2-3, Frequency 1-2x per day, and Strain Yes sometimes with harder stools Fully empty rectum: Yes:   Leakage: No Pads: No Fiber supplement: Yes: stool softener  URINATION Pain with urination: No Fully empty bladder: Yes:   Stream: Strong Urgency: No Frequency: every hour - 2 hours, 2x per night without any leakage Leakage: Walking to the bathroom, Coughing, Sneezing, Laughing, Exercise, Lifting, and Intercourse Pads: Yes: liner, pose pad 3 changes per night  INTERCOURSE Pain with intercourse:  not painful (first time postpartum was but this resolved) Ability to have vaginal penetration:  Yes:   Climax: not painful, denies dryness Marinoff Scale: 0/3  PREGNANCY Vaginal deliveries 4 Tearing No C-section deliveries 0 Currently pregnant No  PROLAPSE None    OBJECTIVE:   DIAGNOSTIC FINDINGS:  PATIENT SURVEYS:   (UDI-6 Short Form) Score = 50  COGNITION:  Overall cognitive status: Within functional limits for tasks assessed     SENSATION:  Light touch: Appears intact  Proprioception: Appears intact  MUSCLE LENGTH: Bil hamstrings and  adductors limited by 25%  LUMBAR SPECIAL TESTS:  Stork standing: Positive, SI Compression/distraction test: (+) for pain with distraction and felt better with compression, FABER test: Positive, and Gaenslen's test: Positive *all positives for pain at Rt side                POSTURE: rounded shoulders, forward head, and anterior pelvic tilt   LUMBARAROM/PROM  A/PROM A/PROM  eval  Flexion Limited by 25%  Extension WFL  Right lateral flexion Limited by 25%  Left lateral flexion Limited by 25%  Right rotation Limited by 25%  Left rotation Limited by 25%   (Blank rows = not tested)  LOWER EXTREMITY ROM:  WFL  LOWER EXTREMITY MMT:  Rt hip abduction 3/5 with pain, ext/flex/add 4+/5; LT hip grossly 4/5 no pain,  knees and ankles 5/5   PALPATION:   General  mild TTP at Rt lower quadrant                External Perineal Exam pt deferred                             Internal Pelvic Floor pt deferred  Patient confirms identification and approves PT to assess internal pelvic floor and treatment No 10/24/22: yes    PELVIC MMT:   MMT 10/24/22   Vaginal 2/5, 3/5 with reps and cues, 10s but increased time to relax, 8 reps  Internal Anal Sphincter   External Anal Sphincter   Puborectalis   Diastasis Recti   (Blank rows = not tested)        TONE: WFL  PROLAPSE: Not seen in hooklying   TODAY'S TREATMENT   10/24/22 Pt consented to internal vaginal assessment this date and found to have decreased strength, coordination, and endurance.  Findings above.  Extra time for NMRE for training proper contraction/relaxation mechanics and coordination with breathing. Pt initially had weaker strength and increased time to relax but with cues and extra time and reps increased strength and more able to relax. Pt had no pain.  HEP updated and reviewed with pt NMRE: for all exercises with all being cued for  coordination of pelvic floor and breathing mechanics to improved pelvic floor strength  and decreased leakage.  2x10 seated with towel roll for pelvic floor contractions pt reports she felt this better X10 pelvic floor with simulating cough - no leakage 2x10 bridges 2x10 opp hand and knee press     PATIENT EDUCATION:  Education details: [id number] Person educated: Patient Education method: Programmer, multimedia, Demonstration, Tactile cues, Verbal cues, and Handouts Education comprehension: verbalized understanding and returned demonstration   HOME EXERCISE PROGRAM: [id number]  ASSESSMENT:  CLINICAL IMPRESSION: Patient reports she had been seeing a lot of improvement with leakage and urgency overall since eval but did have setback and had a large leakage with laughter last week. Pt consented to internal vaginal assessment and treatment today and findings above. Pt tolerated well and time spent in session to improve pt's coordination of pelvic floor mechanics and breathing mechanics, then session focused on strengthening exercises with hip and core strength to coordinate pelvic floor and breathing. No leakage during session. Pt would benefit from additional PT to further address deficits.     OBJECTIVE IMPAIRMENTS decreased coordination, decreased endurance, decreased mobility, decreased strength, increased fascial restrictions, increased muscle spasms, impaired flexibility, improper body mechanics, postural dysfunction, and pain.   ACTIVITY LIMITATIONS carrying, lifting, squatting, continence, locomotion level, and caring for others  PARTICIPATION LIMITATIONS: interpersonal relationship and community activity  PERSONAL FACTORS Fitness, Time since onset of injury/illness/exacerbation, and 1 comorbidity: x4 vaginal births, postpartum  are also affecting patient's functional outcome.   REHAB POTENTIAL: Good  CLINICAL DECISION MAKING: Stable/uncomplicated  EVALUATION COMPLEXITY: Low   GOALS: Goals reviewed with patient? Yes  SHORT TERM GOALS: Target date: 10/31/2022  Pt to be I  with HEP.  Baseline: Goal status: INITIAL  2.  Pt to report improved time between bladder voids to at least 2 hours for improved QOL with decreased urinary frequency.   Baseline:  Goal status: INITIAL  3.  Pt to demonstrate at least 3/5 pelvic floor strength for improved pelvic stability and decreased strain at pelvic floor/ decrease leakage.  Baseline:  Goal status: INITIAL  4.  Pt will be able to functional actions such as squatting body weight without compensatory strategies and improved coordination of pelvic floor and breathing mechanics without leakage  Baseline:  Goal status: INITIAL   LONG TERM GOALS: Target date:  01/03/23    Pt to be I with advanced HEP.  Baseline:  Goal status: INITIAL  2.  Pt to demonstrate at least 5/5 bil hip strength for improved pelvic stability and functional squats without leakage.  Baseline:  Goal status: INITIAL  3.  Pt to report improved time between bladder voids to at least 3 hours for improved QOL with decreased urinary frequency.   Baseline:  Goal status: INITIAL  4.  Pt to demonstrate at least 4/5 pelvic floor strength for improved pelvic stability and decreased strain at pelvic floor/ decrease leakage.  Baseline:  Goal status: INITIAL  5.  Pt will be able to functional actions such as squatting 20# without compensatory strategies and improved coordination of pelvic floor and breathing mechanics without leakage  Baseline:  Goal status: INITIAL  6.  Pt to be I with voiding and breathing mechanics for decreased strain at pelvic floor with voiding.  Baseline:  Goal status: INITIAL  PLAN: PT FREQUENCY: 1x/week  PT DURATION:  6 sessions  PLANNED INTERVENTIONS: Therapeutic exercises, Therapeutic activity, Neuromuscular re-education, Patient/Family education, Self Care, Joint mobilization, Dry Needling, Spinal mobilization, Cryotherapy, Moist heat, scar mobilization,  Taping, Biofeedback, and Manual therapy  PLAN FOR NEXT SESSION:  DRA assessment, internal assessment if pt consents, coordination of pelvic floor and breathing mechanics   Otelia Sergeant, PT, DPT 11/01/233:24 PM

## 2022-10-24 NOTE — Assessment & Plan Note (Signed)
Not having any symptoms. Just resumed intercourse. Collected GC, chlamydia, BV/yeast vaginal swabs.  Also collected HIV, RPR. Will let patient know results as they return.

## 2022-10-24 NOTE — Assessment & Plan Note (Signed)
Couple days of sore throat.  Sick contact with kids at home. Well-appearing, afebrile, normal lung sounds.  No concern for bacterial infection, pneumonia. Discussed supportive care and return precautions.

## 2022-10-25 LAB — RPR: RPR Ser Ql: NONREACTIVE

## 2022-10-25 LAB — HIV ANTIBODY (ROUTINE TESTING W REFLEX): HIV Screen 4th Generation wRfx: NONREACTIVE

## 2022-10-26 LAB — CERVICOVAGINAL ANCILLARY ONLY
Chlamydia: NEGATIVE
Comment: NEGATIVE
Comment: NORMAL
Neisseria Gonorrhea: NEGATIVE

## 2022-10-31 ENCOUNTER — Encounter: Payer: Medicaid Other | Admitting: Physical Therapy

## 2022-11-08 ENCOUNTER — Ambulatory Visit: Payer: Medicaid Other | Admitting: Physical Therapy

## 2022-11-12 ENCOUNTER — Ambulatory Visit: Payer: Medicaid Other | Admitting: Physical Therapy

## 2022-11-12 DIAGNOSIS — R279 Unspecified lack of coordination: Secondary | ICD-10-CM

## 2022-11-12 DIAGNOSIS — M6281 Muscle weakness (generalized): Secondary | ICD-10-CM

## 2022-11-12 NOTE — Patient Instructions (Signed)

## 2022-11-12 NOTE — Therapy (Signed)
OUTPATIENT PHYSICAL THERAPY FEMALE PELVIC EVALUATION   Patient Name: Susan Crawford MRN: [id number] DOB:[birth year], 36 y.o., female Today's Date: 11/12/2022   PT End of Session - 11/12/22 1536     Visit Number 3    Date for PT Re-Evaluation 01/03/23    Authorization Type [employer]    PT Start Time 1533    PT Stop Time 1611    PT Time Calculation (min) 38 min    Activity Tolerance Patient tolerated treatment well    Behavior During Therapy [facility] for tasks assessed/performed             Past Medical History:  Diagnosis Date   Asthma    Asthma affecting pregnancy in first trimester 05/10/2022   Hypertension    'goes crazy with pregnancies"   Kidney stone 2014   Preterm labor    Past Surgical History:  Procedure Laterality Date   BREAST LUMPECTOMY     Patient Active Problem List   Diagnosis Date Noted   Viral illness 10/24/2022   Snoring 10/24/2022   Food insecurity 05/10/2022   BMI 36.0-36.9,adult 05/10/2022   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 03/15/2022   Alpha thalassemia silent carrier 03/15/2022   Chronic hypertension affecting pregnancy 12/06/2021   Screening examination for STD (sexually transmitted disease) 11/21/2021   ASTHMA, PERSISTENT 09/25/2010   Seasonal allergies 10/18/2008    PCP: Orvis Brill, DO  REFERRING PROVIDER: Manya Silvas, CNM   REFERRING DIAG: N39.3 (ICD-10-CM) - Primary stress urinary incontinence Z39.2 (ICD-10-CM) - Postpartum care and examination   THERAPY DIAG:  Unspecified lack of coordination  Muscle weakness (generalized)  Rationale for Evaluation and Treatment Rehabilitation  ONSET DATE: after second baby  SUBJECTIVE:                                                                                                                                                                                           SUBJECTIVE STATEMENT: Pt reports she is no longer urinating quicker than every 2 hours sometimes longer and no  longer gets up at night, no longer having pain. Pt only has leakage with not getting there quickly enough or a very small amount with a strong cough. Now going through 2 liners/pads but this may not be due to leakage but she's worn it for a while and needs to change. Usually only having small leakage on one pad per day. Pt states she has about 3 days out of the week without any leakage.   Fluid intake: Yes: breastfeeding and drinking a lot of water right now     PAIN:  Are you having pain? No   PRECAUTIONS:  Other: postpartum  WEIGHT BEARING RESTRICTIONS No  FALLS:  Has patient fallen in last 6 months? 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  PLOF: Independent  PATIENT GOALS to have less leakage  PERTINENT HISTORY:  [id number], x4 vaginal births, HTN Sexual abuse: No  BOWEL MOVEMENT Pain with bowel movement: No Type of bowel movement:Type (Bristol Stool Scale) 2-3, Frequency 1-2x per day, and Strain Yes sometimes with harder stools Fully empty rectum: Yes:   Leakage: No Pads: No Fiber supplement: Yes: stool softener  URINATION Pain with urination: No Fully empty bladder: Yes:   Stream: Strong Urgency: No Frequency: every hour - 2 hours, 2x per night without any leakage Leakage: Walking to the bathroom, Coughing, Sneezing, Laughing, Exercise, Lifting, and Intercourse Pads: Yes: liner, pose pad 3 changes per night  INTERCOURSE Pain with intercourse:  not painful (first time postpartum was but this resolved) Ability to have vaginal penetration:  Yes:   Climax: not painful, denies dryness Marinoff Scale: 0/3  PREGNANCY Vaginal deliveries 4 Tearing No C-section deliveries 0 Currently pregnant No  PROLAPSE None    OBJECTIVE:   DIAGNOSTIC FINDINGS:  PATIENT SURVEYS:   (UDI-6 Short Form) Score = 50  COGNITION:  Overall cognitive status: Within functional limits for tasks  assessed     SENSATION:  Light touch: Appears intact  Proprioception: Appears intact  MUSCLE LENGTH: Bil hamstrings and adductors limited by 25%  LUMBAR SPECIAL TESTS:  Stork standing: Positive, SI Compression/distraction test: (+) for pain with distraction and felt better with compression, FABER test: Positive, and Gaenslen's test: Positive *all positives for pain at Rt side                POSTURE: rounded shoulders, forward head, and anterior pelvic tilt   LUMBARAROM/PROM  A/PROM A/PROM  eval  Flexion Limited by 25%  Extension WFL  Right lateral flexion Limited by 25%  Left lateral flexion Limited by 25%  Right rotation Limited by 25%  Left rotation Limited by 25%   (Blank rows = not tested)  LOWER EXTREMITY ROM:  WFL  LOWER EXTREMITY MMT:  Rt hip abduction 3/5 with pain, ext/flex/add 4+/5; LT hip grossly 4/5 no pain,  knees and ankles 5/5   PALPATION:   General  mild TTP at Rt lower quadrant                External Perineal Exam pt deferred                             Internal Pelvic Floor pt deferred  Patient confirms identification and approves PT to assess internal pelvic floor and treatment No 10/24/22: yes    PELVIC MMT:   MMT 10/24/22   Vaginal 2/5, 3/5 with reps and cues, 10s but increased time to relax, 8 reps  Internal Anal Sphincter   External Anal Sphincter   Puborectalis   Diastasis Recti   (Blank rows = not tested)        TONE: WFL  PROLAPSE: Not seen in hooklying   TODAY'S TREATMENT   11/12/22: NMRE: all exercises cued for breathing and pelvic floor coordination to decreased strain at pelvic floor and decreased leakage.  Body weight squats 2x10  Standing pelvic floor contractions x10, improved with coordination and pelvic floor activation with ball squeezes at hands.  Palloffs green band 2x10 Hip thrusters 2x10 Standing diagonals 8# 2x10  each 3x10 side planks    PATIENT EDUCATION:  Education details: [id number] Person  educated: Patient Education method: Consulting civil engineer, Demonstration, Tactile cues, Verbal cues, and Handouts Education comprehension: verbalized understanding and returned demonstration   HOME EXERCISE PROGRAM: [id number]  ASSESSMENT:  CLINICAL IMPRESSION: Patient session focused on hip and core strengthening with emphasis on coordination of breathing and pelvic floor and core activation for improved strength and decreased leakage. Pt tolerated well without leakage during session. Pt did benefit from minimal cues. Pt would benefit from additional PT to further address deficits.     OBJECTIVE IMPAIRMENTS decreased coordination, decreased endurance, decreased mobility, decreased strength, increased fascial restrictions, increased muscle spasms, impaired flexibility, improper body mechanics, postural dysfunction, and pain.   ACTIVITY LIMITATIONS carrying, lifting, squatting, continence, locomotion level, and caring for others  PARTICIPATION LIMITATIONS: interpersonal relationship and community activity  PERSONAL FACTORS Fitness, Time since onset of injury/illness/exacerbation, and 1 comorbidity: x4 vaginal births, postpartum  are also affecting patient's functional outcome.   REHAB POTENTIAL: Good  CLINICAL DECISION MAKING: Stable/uncomplicated  EVALUATION COMPLEXITY: Low   GOALS: Goals reviewed with patient? Yes  SHORT TERM GOALS: Target date: 10/31/2022  Pt to be I with HEP.  Baseline: Goal status: MET  2.  Pt to report improved time between bladder voids to at least 2 hours for improved QOL with decreased urinary frequency.   Baseline:  Goal status: MET  3.  Pt to demonstrate at least 3/5 pelvic floor strength for improved pelvic stability and decreased strain at pelvic floor/ decrease leakage.  Baseline:  Goal status: MET  4.  Pt will be able to functional actions such as squatting body weight without compensatory strategies and improved coordination of pelvic floor and  breathing mechanics without leakage  Baseline:  Goal status: MET   LONG TERM GOALS: Target date:  01/03/23    Pt to be I with advanced HEP.  Baseline:  Goal status: INITIAL  2.  Pt to demonstrate at least 5/5 bil hip strength for improved pelvic stability and functional squats without leakage.  Baseline:  Goal status: INITIAL  3.  Pt to report improved time between bladder voids to at least 3 hours for improved QOL with decreased urinary frequency.   Baseline:  Goal status: INITIAL  4.  Pt to demonstrate at least 4/5 pelvic floor strength for improved pelvic stability and decreased strain at pelvic floor/ decrease leakage.  Baseline:  Goal status: INITIAL  5.  Pt will be able to functional actions such as squatting 20# without compensatory strategies and improved coordination of pelvic floor and breathing mechanics without leakage  Baseline:  Goal status: INITIAL  6.  Pt to be I with voiding and breathing mechanics for decreased strain at pelvic floor with voiding.  Baseline:  Goal status: INITIAL  PLAN: PT FREQUENCY: 1x/week  PT DURATION:  6 sessions  PLANNED INTERVENTIONS: Therapeutic exercises, Therapeutic activity, Neuromuscular re-education, Patient/Family education, Self Care, Joint mobilization, Dry Needling, Spinal mobilization, Cryotherapy, Moist heat, scar mobilization, Taping, Biofeedback, and Manual therapy  PLAN FOR NEXT SESSION: DRA assessment, internal assessment if pt consents, coordination of pelvic floor and breathing mechanics   Stacy Gardner, PT, DPT 11/20/234:12 PM

## 2022-11-21 ENCOUNTER — Ambulatory Visit: Payer: Medicaid Other | Admitting: Physical Therapy

## 2022-11-21 DIAGNOSIS — R279 Unspecified lack of coordination: Secondary | ICD-10-CM

## 2022-11-21 DIAGNOSIS — M6281 Muscle weakness (generalized): Secondary | ICD-10-CM | POA: Diagnosis not present

## 2022-11-21 DIAGNOSIS — R293 Abnormal posture: Secondary | ICD-10-CM

## 2022-11-21 NOTE — Patient Instructions (Signed)

## 2022-11-21 NOTE — Therapy (Signed)
OUTPATIENT PHYSICAL THERAPY FEMALE PELVIC EVALUATION   Patient Name: Susan Crawford MRN: [id number] DOB:1986/09/03, 36 y.o., female Today's Date: 11/21/2022   PT End of Session - 11/21/22 1449     Visit Number 4    Date for PT Re-Evaluation 01/03/23    Authorization Type [employer]    PT Start Time 1446    PT Stop Time 1528    PT Time Calculation (min) 42 min    Activity Tolerance Patient tolerated treatment well    Behavior During Therapy [facility] for tasks assessed/performed             Past Medical History:  Diagnosis Date   Asthma    Asthma affecting pregnancy in first trimester 05/10/2022   Hypertension    'goes crazy with pregnancies"   Kidney stone 2014   Preterm labor    Past Surgical History:  Procedure Laterality Date   BREAST LUMPECTOMY     Patient Active Problem List   Diagnosis Date Noted   Viral illness 10/24/2022   Snoring 10/24/2022   Food insecurity 05/10/2022   BMI 36.0-36.9,adult 05/10/2022   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 03/15/2022   Alpha thalassemia silent carrier 03/15/2022   Chronic hypertension affecting pregnancy 12/06/2021   Screening examination for STD (sexually transmitted disease) 11/21/2021   ASTHMA, PERSISTENT 09/25/2010   Seasonal allergies 10/18/2008    PCP: Orvis Brill, DO  REFERRING PROVIDER: Manya Silvas, CNM   REFERRING DIAG: N39.3 (ICD-10-CM) - Primary stress urinary incontinence Z39.2 (ICD-10-CM) - Postpartum care and examination   THERAPY DIAG:  Unspecified lack of coordination  Muscle weakness (generalized)  Abnormal posture  Rationale for Evaluation and Treatment Rehabilitation  ONSET DATE: after second baby  SUBJECTIVE:                                                                                                                                                                                           SUBJECTIVE STATEMENT: Pt reports she is no longer having leakage, urgency has been only  complaint and this is much better with urge drill. Pt reports she no longer has pain or any other issues.   Fluid intake: Yes: breastfeeding and drinking a lot of water right now     PAIN:  Are you having pain? No   PRECAUTIONS: Other: postpartum  WEIGHT BEARING RESTRICTIONS No  FALLS:  Has patient fallen in last 6 months? 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  PLOF: Independent  PATIENT GOALS to have less leakage  PERTINENT HISTORY:  [id number], x4 vaginal births, HTN Sexual  abuse: No  BOWEL MOVEMENT Pain with bowel movement: No Type of bowel movement:Type (Bristol Stool Scale) 2-3, Frequency 1-2x per day, and Strain Yes sometimes with harder stools Fully empty rectum: Yes:   Leakage: No Pads: No Fiber supplement: Yes: stool softener  URINATION Pain with urination: No Fully empty bladder: Yes:   Stream: Strong Urgency: No Frequency: every hour - 2 hours, 2x per night without any leakage Leakage: Walking to the bathroom, Coughing, Sneezing, Laughing, Exercise, Lifting, and Intercourse Pads: Yes: liner, pose pad 3 changes per night  INTERCOURSE Pain with intercourse:  not painful (first time postpartum was but this resolved) Ability to have vaginal penetration:  Yes:   Climax: not painful, denies dryness Marinoff Scale: 0/3  PREGNANCY Vaginal deliveries 4 Tearing No C-section deliveries 0 Currently pregnant No  PROLAPSE None    OBJECTIVE:   DIAGNOSTIC FINDINGS:  PATIENT SURVEYS:   (UDI-6 Short Form) Score = 50  COGNITION:  Overall cognitive status: Within functional limits for tasks assessed     SENSATION:  Light touch: Appears intact  Proprioception: Appears intact  MUSCLE LENGTH: Bil hamstrings and adductors limited by 25%  LUMBAR SPECIAL TESTS:  Stork standing: Positive, SI Compression/distraction test: (+) for pain with distraction and felt better with  compression, FABER test: Positive, and Gaenslen's test: Positive *all positives for pain at Rt side                POSTURE: rounded shoulders, forward head, and anterior pelvic tilt   LUMBARAROM/PROM  A/PROM A/PROM  eval  Flexion Limited by 25%  Extension WFL  Right lateral flexion Limited by 25%  Left lateral flexion Limited by 25%  Right rotation Limited by 25%  Left rotation Limited by 25%   (Blank rows = not tested)  LOWER EXTREMITY ROM:  WFL  LOWER EXTREMITY MMT:  Rt hip abduction 3/5 with pain, ext/flex/add 4+/5; LT hip grossly 4/5 no pain,  knees and ankles 5/5   PALPATION:   General  mild TTP at Rt lower quadrant                External Perineal Exam pt deferred                             Internal Pelvic Floor pt deferred  Patient confirms identification and approves PT to assess internal pelvic floor and treatment No 10/24/22: yes    PELVIC MMT:   MMT 10/24/22  11/21/22   Vaginal 2/5, 3/5 with reps and cues, 10s but increased time to relax, 8 reps Pt deferred   Internal Anal Sphincter    External Anal Sphincter    Puborectalis    Diastasis Recti    (Blank rows = not tested)        TONE: WFL  PROLAPSE: Not seen in hooklying   TODAY'S TREATMENT   11/21/22:  NMRE: all exercises cued for breathing and pelvic floor coordination to decreased strain at pelvic floor and decreased leakage.  Squats 20# 2x10 Lunges 10# 2x10 Mario punches 5# each hand 2x10 Diagonal cross body pull down 5# each hand  2x10 Bridges 5# 2x10 Dead bugs 2x10 5#     PATIENT EDUCATION:  Education details: [id number] Person educated: Patient Education method: Consulting civil engineer, Demonstration, Corporate treasurer cues, Verbal cues, and Handouts Education comprehension: verbalized understanding and returned demonstration   HOME EXERCISE PROGRAM: [id number]  ASSESSMENT:  CLINICAL IMPRESSION: Patient session focused on hip and core strengthening with emphasis on  coordination of breathing and  pelvic floor and core activation for improved strength and decreased leakage. Pt tolerated well without leakage during session. Pt denied additional questions or concerns at this time and reports she feels comfortable with PT today post treatment. Pt has met all goals except internal strength test of pelvic floor as she deferred testing with this today as she is no longer having urinary symptoms with leakage per pt. This will serve as pt's DC from PT and pt knows she would need new referral for PT for any future PT needs.     OBJECTIVE IMPAIRMENTS decreased coordination, decreased endurance, decreased mobility, decreased strength, increased fascial restrictions, increased muscle spasms, impaired flexibility, improper body mechanics, postural dysfunction, and pain.   ACTIVITY LIMITATIONS carrying, lifting, squatting, continence, locomotion level, and caring for others  PARTICIPATION LIMITATIONS: interpersonal relationship and community activity  PERSONAL FACTORS Fitness, Time since onset of injury/illness/exacerbation, and 1 comorbidity: x4 vaginal births, postpartum  are also affecting patient's functional outcome.   REHAB POTENTIAL: Good  CLINICAL DECISION MAKING: Stable/uncomplicated  EVALUATION COMPLEXITY: Low   GOALS: Goals reviewed with patient? Yes  SHORT TERM GOALS: Target date: 10/31/2022  Pt to be I with HEP.  Baseline: Goal status: MET  2.  Pt to report improved time between bladder voids to at least 2 hours for improved QOL with decreased urinary frequency.   Baseline:  Goal status: MET  3.  Pt to demonstrate at least 3/5 pelvic floor strength for improved pelvic stability and decreased strain at pelvic floor/ decrease leakage.  Baseline:  Goal status: MET  4.  Pt will be able to functional actions such as squatting body weight without compensatory strategies and improved coordination of pelvic floor and breathing mechanics without leakage  Baseline:  Goal status:  MET   LONG TERM GOALS: Target date:  01/03/23    Pt to be I with advanced HEP.  Baseline:  Goal status: MET  2.  Pt to demonstrate at least 5/5 bil hip strength for improved pelvic stability and functional squats without leakage.  Baseline:  Goal status: MET  3.  Pt to report improved time between bladder voids to at least 3 hours for improved QOL with decreased urinary frequency.   Baseline:  Goal status: MET  4.  Pt to demonstrate at least 4/5 pelvic floor strength for improved pelvic stability and decreased strain at pelvic floor/ decrease leakage.  Baseline:  Goal status: pt deferred due to no longer having leakage.   5.  Pt will be able to functional actions such as squatting 20# without compensatory strategies and improved coordination of pelvic floor and breathing mechanics without leakage  Baseline:  Goal status: MET  6.  Pt to be I with voiding and breathing mechanics for decreased strain at pelvic floor with voiding.  Baseline:  Goal status: MET  PLAN: PT FREQUENCY: 1x/week  PT DURATION:  6 sessions  PLANNED INTERVENTIONS: Therapeutic exercises, Therapeutic activity, Neuromuscular re-education, Patient/Family education, Self Care, Joint mobilization, Dry Needling, Spinal mobilization, Cryotherapy, Moist heat, scar mobilization, Taping, Biofeedback, and Manual therapy  PLAN FOR NEXT SESSION:   PHYSICAL THERAPY DISCHARGE SUMMARY  Visits from Start of Care: 4  Current functional level related to goals / functional outcomes: All goals met except pelvic floor internal strength testing as pt deferred internal testing today   Remaining deficits: Pt reports sometimes urgency but has improved with urge drill   Education / Equipment: HEP   Patient agrees to discharge. Patient goals were met.  Patient is being discharged due to meeting the stated rehab goals.   Stacy Gardner, PT, DPT 11/29/233:23 PM

## 2022-11-28 ENCOUNTER — Encounter: Payer: Medicaid Other | Admitting: Physical Therapy

## 2022-12-05 ENCOUNTER — Encounter: Payer: Medicaid Other | Admitting: Physical Therapy

## 2022-12-07 ENCOUNTER — Ambulatory Visit
Admission: EM | Admit: 2022-12-07 | Discharge: 2022-12-07 | Disposition: A | Discharge status: Home / Self Care | Payer: Medicaid Other | Attending: Physician Assistant | Admitting: Physician Assistant

## 2022-12-07 DIAGNOSIS — Z1152 Encounter for screening for COVID-19: Secondary | ICD-10-CM | POA: Insufficient documentation

## 2022-12-07 DIAGNOSIS — R509 Fever, unspecified: Secondary | ICD-10-CM | POA: Insufficient documentation

## 2022-12-07 DIAGNOSIS — J111 Influenza due to unidentified influenza virus with other respiratory manifestations: Secondary | ICD-10-CM | POA: Diagnosis present

## 2022-12-07 DIAGNOSIS — J22 Unspecified acute lower respiratory infection: Secondary | ICD-10-CM | POA: Diagnosis not present

## 2022-12-07 DIAGNOSIS — R051 Acute cough: Secondary | ICD-10-CM | POA: Diagnosis present

## 2022-12-07 LAB — POCT INFLUENZA A/B
Influenza A, POC: POSITIVE — AB
Influenza B, POC: NEGATIVE

## 2022-12-07 MED ORDER — PREDNISONE 10 MG PO TABS: 10.0000 mg | ORAL_TABLET | Freq: Three times a day (TID) | ORAL | 0 refills | Status: DC

## 2022-12-07 MED ORDER — OSELTAMIVIR PHOSPHATE 75 MG PO CAPS: 75.0000 mg | ORAL_CAPSULE | Freq: Two times a day (BID) | ORAL | 0 refills | Status: AC

## 2022-12-07 MED ORDER — AZITHROMYCIN 250 MG PO TABS: ORAL_TABLET | ORAL | 0 refills | Status: AC

## 2022-12-07 MED ORDER — IBUPROFEN 800 MG PO TABS
800.0000 mg | ORAL_TABLET | Freq: Once | ORAL | Status: AC
  Administered 2022-12-07: 800 mg via ORAL

## 2022-12-07 MED ORDER — FLUTICASONE PROPIONATE 50 MCG/ACT NA SUSP: 2.0000 | Freq: Every day | NASAL | 2 refills | Status: DC

## 2022-12-07 NOTE — ED Provider Notes (Signed)
[facility]    CSN: [id number] Arrival date & time: 12/07/22  1343      History   Chief Complaint Chief Complaint  Patient presents with   Cough   Nasal Congestion    HPI Susan Crawford is a 36 y.o. female.   36 year old female presents with fever, cough and congestion.  Patient indicates for the past 2 days she has been having cough, chest congestion with thick purulent green production.  She also indicates that he has been having some upper respiratory congestion with rhinitis and postnasal drip.  She indicates that yesterday she started with a fever that was 102-103, associated with body aches, joint pain, muscle soreness, chills, fatigue, and lethargy.  Patient indicates that she has been taking Coricidin to help with the cough congestion but getting little relief.  Patient denies any nausea or vomiting.  She does indicate that she has been around her daughter who has been sick but now with the same symptoms.  She is tolerating fluids well.   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 Authorizing Provider  azithromycin (ZITHROMAX Z-PAK) 250 MG tablet 2 tablets initially, then 1 daily until completed. 12/07/22  Yes Ellsworth Lennox, PA-C  oseltamivir (TAMIFLU) 75 MG capsule Take 1 capsule (75 mg total) by mouth every 12 (twelve) hours. 12/07/22  Yes Ellsworth Lennox, PA-C  acetaminophen (TYLENOL) 500 MG tablet Take 1,000 mg by mouth every 6 (six) hours as needed.    [provider]  ibuprofen (ADVIL) 600 MG tablet Take 1 tablet (600 mg total) by mouth every 6 (six) hours. 07/13/22   Aviva Signs, CNM  Prenatal Vit-Fe Fumarate-FA (PREPLUS) 27-1 MG TABS Take 1 tablet by mouth daily. 02/15/22   Hermina Staggers, MD    Family History Family History  Problem Relation Age of Onset   Asthma Mother    Hypertension Mother    Hypertension Maternal Grandmother    Asthma Maternal Grandmother    Other Neg Hx     Social History Social History   Tobacco Use   Smoking status: Never   Smokeless tobacco: Never  Vaping Use   Vaping Use:  Never used  Substance Use Topics   Alcohol use: No    Comment: rare   Drug use: No     Allergies   Patient has no known allergies.   Review of Systems Review of Systems  Constitutional:  Positive for chills, fatigue and fever.  HENT:  Positive for postnasal drip, rhinorrhea and sinus pressure.   Respiratory:  Positive for cough.      Physical Exam Triage Vital Signs ED Triage Vitals  Enc Vitals Group     BP 12/07/22 1439 103/87     Pulse Rate 12/07/22 1439 (!) 110     Resp 12/07/22 1439 19     Temp 12/07/22 1439 (!) 103.1 F (39.5 C)     Temp Source 12/07/22 1439 Oral     SpO2 12/07/22 1439 98 %     Weight --      Height --      Head Circumference --      Peak Flow --      Pain Score 12/07/22 1438 6     Pain Loc --      Pain Edu? --      Excl. in [place]? --    No data  found.  Updated Vital Signs BP 103/87   Pulse (!) 110   Temp (!) 103.1 F (39.5 C) (Oral)   Resp 19   SpO2 98%   Visual Acuity Right Eye Distance:   Left Eye Distance:   Bilateral Distance:    Right Eye Near:   Left Eye Near:    Bilateral Near:     Physical Exam Constitutional:      Appearance: Normal appearance.  HENT:     Right Ear: Tympanic membrane and ear canal normal.     Left Ear: Tympanic membrane and ear canal normal.     Mouth/Throat:     Mouth: Mucous membranes are moist.     Pharynx: Oropharynx is clear.  Cardiovascular:     Rate and Rhythm: Normal rate and regular rhythm.     Heart sounds: Normal heart sounds.  Pulmonary:     Effort: Pulmonary effort is normal.     Breath sounds: Normal breath sounds and air entry. No wheezing, rhonchi or rales.  Lymphadenopathy:     Cervical: No cervical adenopathy.  Neurological:     Mental Status: She is alert.      UC Treatments / Results  Labs (all labs ordered are listed, but only abnormal results are displayed) Labs Reviewed  POCT INFLUENZA A/B - Abnormal; Notable for the following components:      Result Value   Influenza A, POC Positive (*)    All other components within normal limits  SARS CORONAVIRUS 2 (TAT 6-24 HRS)    EKG   Radiology No results found.  Procedures Procedures (including critical care time)  Medications Ordered in UC Medications  ibuprofen (ADVIL) tablet 800 mg (800 mg Oral Given 12/07/22 1448)    Initial Impression / Assessment and Plan / UC Course  I have reviewed the triage vital signs and the nursing notes.  Pertinent labs & imaging results that were available during my care of the patient were reviewed by me and considered in my medical decision making (see chart for details).    Plan: 1.  The fever will be treated with the following: A.  Ibuprofen 800 mg given in the office. B.  Advised Tylenol ibuprofen on a regular basis to control fever and body aches. 2.  The acute cough will be treated with the following: A.  Advised to continue take Coricidin as needed for cough and congestion. 3.  Screening for COVID-19 will be treated with the following: A.  Treatment will be modified depending on the results of the COVID-19 test. 4.  The lower respiratory infection be treated with the following: A.  Zithromax 250 mg, 2 tablets initially and then 1 daily until medications completed to treat the respiratory infection. 5.  The flu will be treated with the following: A.  Tamiflu 75 mg twice daily for 5 days to treat the infection. 5.  Advised follow-up PCP or return to urgent care if symptoms fail to improve.  Final Clinical Impressions(s) / UC Diagnoses   Final diagnoses:  Fever, unspecified  Acute cough  Encounter for screening for COVID-19  Lower respiratory infection  Flu     Discharge Instructions      Test will be completed in 48 hours.  If you do not get a call from this office that indicates the test is negative.  Log onto MyChart to view the test results when they post in 48 hours.  Advised to take the Zithromax 250 mg, 2 tablets initially and then 1 tablet daily until medication is completed.  Advised to continue to take Coricidin for cough and congestion.  Advised take Motrin or ibuprofen for fever aches and discomfort. Advised to follow-up PCP or return to urgent care if symptoms fail to improve over the next couple days.    ED Prescriptions     Medication Sig Dispense Auth. Provider   azithromycin (ZITHROMAX Z-PAK) 250 MG tablet 2 tablets initially, then 1 daily until completed. 6 each Nyoka Lint, PA-C   fluticasone [facility]) 50 MCG/ACT nasal spray  (Status: Discontinued) Place 2 sprays into both nostrils daily. 11.1 mL Nyoka Lint, PA-C   predniSONE (DELTASONE) 10 MG tablet  (Status: Discontinued) Take 1 tablet (10 mg total) by mouth 3 (three) times daily. 10 tablet Nyoka Lint, PA-C   oseltamivir (TAMIFLU) 75 MG capsule  Take 1 capsule (75 mg total) by mouth every 12 (twelve) hours. 10 capsule Nyoka Lint, PA-C      PDMP not reviewed this encounter.   Nyoka Lint, PA-C 12/07/22 1520

## 2022-12-07 NOTE — ED Triage Notes (Signed)
Pt presents to uc with co of body aches and chills and fatigue with fevers for one day. Pt has been taking coricidin for medication

## 2022-12-07 NOTE — Discharge Instructions (Signed)
Test will be completed in 48 hours.  If you do not get a call from this office that indicates the test is negative.  Log onto MyChart to view the test results when they post in 48 hours.  Advised to take the Zithromax 250 mg, 2 tablets initially and then 1 tablet daily until medication is completed.  Advised to continue to take Coricidin for cough and congestion.  Advised take Motrin or ibuprofen for fever aches and discomfort. Advised to follow-up PCP or return to urgent care if symptoms fail to improve over the next couple days.

## 2022-12-08 LAB — SARS CORONAVIRUS 2 (TAT 6-24 HRS): SARS Coronavirus 2: NEGATIVE

## 2022-12-10 ENCOUNTER — Encounter (HOSPITAL_BASED_OUTPATIENT_CLINIC_OR_DEPARTMENT_OTHER): Payer: Medicaid Other | Admitting: Internal Medicine

## 2022-12-27 ENCOUNTER — Other Ambulatory Visit (HOSPITAL_COMMUNITY)
Admission: RE | Admit: 2022-12-27 | Discharge: 2022-12-27 | Disposition: A | Discharge status: Home / Self Care | Payer: Medicaid Other | Source: Ambulatory Visit | Attending: Family Medicine | Admitting: Family Medicine

## 2022-12-27 ENCOUNTER — Encounter: Payer: Self-pay | Admitting: Student

## 2022-12-27 ENCOUNTER — Ambulatory Visit (INDEPENDENT_AMBULATORY_CARE_PROVIDER_SITE_OTHER): Payer: Medicaid Other | Admitting: Student

## 2022-12-27 VITALS — BP 128/70 | HR 97 | Wt 183.0 lb

## 2022-12-27 DIAGNOSIS — Z7251 High risk heterosexual behavior: Secondary | ICD-10-CM | POA: Insufficient documentation

## 2022-12-27 DIAGNOSIS — Z113 Encounter for screening for infections with a predominantly sexual mode of transmission: Secondary | ICD-10-CM

## 2022-12-27 LAB — POCT WET PREP (WET MOUNT)
Clue Cells Wet Prep Whiff POC: POSITIVE
Trichomonas Wet Prep HPF POC: ABSENT

## 2022-12-27 LAB — POCT URINE PREGNANCY: Preg Test, Ur: NEGATIVE

## 2022-12-27 MED ORDER — FLUTICASONE PROPIONATE 50 MCG/ACT NA SUSP: 2.0000 | Freq: Every day | NASAL | 6 refills | Status: AC

## 2022-12-27 NOTE — Patient Instructions (Signed)
It was great seeing you today.  Start using Flonase 2 puffs per nostril for 3 days, then one spray per nostril every day.  We collected testing today- I will call you if it is abnormal. If your results are normal, I will send you a MyChart message.   If you have any questions or concerns, please feel free to call the clinic.   Have a wonderful day,  Dr. Orvis Brill [facility] [phone]

## 2022-12-27 NOTE — Progress Notes (Deleted)
    SUBJECTIVE:   CHIEF COMPLAINT / HPI:   LMP December 11-18th Partner has had vasectomy No itching, no irrritation. Penny-smelling discharge  1 partner.   Had influenza B but still having cough, runny nose. 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  PERTINENT  PMH / PSH: ***  OBJECTIVE:   BP 128/70   Pulse 97   Wt 183 lb (83 kg)   LMP 12/03/2022   SpO2 98%   BMI 30.45 kg/m  ***   ASSESSMENT/PLAN:   Screening examination for STD (sexually transmitted disease) Collected gonorrhea, chlamydia, trichomonas, BV and yeast vaginal swabs today. Patient declined HIV/RPR. Also collected urine pregnancy today. Discussed various forms of contraception, patient is not interested in anything else other than condoms.  Encouraged her to continue using condoms to prevent sexually transmitted infections, but particularly to prevent pregnancy given that her partner has decided vasectomy 1 month ago therefore her chance of getting pregnant is still moderate to high risk. Will follow-up on results.   Prescribed Flonase for congestion and likely allergic rhinitis.  Orvis Brill, [place]    {    This will disappear when note is signed, click to select method of visit    :1}

## 2022-12-27 NOTE — Progress Notes (Signed)
    SUBJECTIVE:   CHIEF COMPLAINT / HPI:   Susan Crawford is a 37 year old female here for sexually transmitted infection screening.  LMP December 11-18th 2023. Partner has had vasectomy 1 month ago.  Has not been using condoms. She reports having a "penny smelling urine" discharge.  Denies any itching or irritation.  No itching, no irrritation.  Also having runny nose/congestion since having flu in December.  PERTINENT  PMH / PSH: Reviewed  OBJECTIVE:   BP 128/70   Pulse 97   Wt 183 lb (83 kg)   LMP 12/03/2022   SpO2 98%   BMI 30.45 kg/m   General: Alert and cooperative and appears to be in no acute distress Cardio: Regular rate and rhythm Pulm: Normal work of breathing on room air GU: Page, CMA present during exam.  External vulva and vagina nonerythematous, without any obvious lesions or rash.  Moderate white discharge present.  Normal ruggae of vaginal walls. Cervix is non erythematous and non-friable.  There is no cervical motion tenderness, masses or gross abnormalities appreciated during bimanual exam.   ASSESSMENT/PLAN:   No problem-specific Assessment & Plan notes found for this encounter.     Orvis Brill, [place]

## 2022-12-28 ENCOUNTER — Other Ambulatory Visit: Payer: Self-pay | Admitting: Student

## 2022-12-28 LAB — CERVICOVAGINAL ANCILLARY ONLY
Chlamydia: NEGATIVE
Comment: NEGATIVE
Comment: NORMAL
Neisseria Gonorrhea: NEGATIVE

## 2022-12-28 MED ORDER — METRONIDAZOLE 500 MG PO TABS: 500.0000 mg | ORAL_TABLET | Freq: Two times a day (BID) | ORAL | 0 refills | Status: AC

## 2022-12-28 NOTE — Progress Notes (Signed)
Sent metronidazole 500 mg BID x7 days for BV.

## 2022-12-30 NOTE — Assessment & Plan Note (Addendum)
Collected gonorrhea, chlamydia, trichomonas, BV and yeast vaginal swabs today. Patient declined HIV/RPR. Also collected urine pregnancy today. Discussed various forms of contraception, patient is not interested in anything else other than condoms.  Encouraged her to continue using condoms to prevent sexually transmitted infections, but particularly to prevent pregnancy given that her partner has decided vasectomy 1 month ago therefore her chance of getting pregnant is still moderate to high risk. Will follow-up on results.

## 2023-01-03 ENCOUNTER — Ambulatory Visit (HOSPITAL_BASED_OUTPATIENT_CLINIC_OR_DEPARTMENT_OTHER): Payer: Medicaid Other | Attending: Family Medicine | Admitting: Internal Medicine

## 2023-01-03 DIAGNOSIS — R0683 Snoring: Secondary | ICD-10-CM | POA: Diagnosis present

## 2023-01-04 ENCOUNTER — Other Ambulatory Visit: Payer: Self-pay | Admitting: Student

## 2023-01-04 ENCOUNTER — Encounter: Payer: Self-pay | Admitting: Student

## 2023-01-04 MED ORDER — FLUCONAZOLE 150 MG PO TABS: 150.0000 mg | ORAL_TABLET | Freq: Once | ORAL | 0 refills | Status: AC

## 2023-01-06 DIAGNOSIS — R0683 Snoring: Secondary | ICD-10-CM | POA: Diagnosis not present

## 2023-01-06 NOTE — Procedures (Signed)
    Patient Name: Susan Crawford, Susan Crawford Date: 01/03/2023 Gender: Female D.O.B: 12/20/1986 Age (years): 36 Referring Provider: Martyn Malay Height (inches): 9 Interpreting Physician: Baird Lyons MD, ABSM Weight (lbs): 195 RPSGT: Baxter Flattery BMI: 32 MRN: [id number] Neck Size: 15.00  CLINICAL INFORMATION Sleep Study Type: NPSG Indication for sleep study: Snoring, Witnesses Apnea / Gasping During Sleep Epworth Sleepiness Score: 1  SLEEP STUDY TECHNIQUE As per the AASM Manual for the Scoring of Sleep and Associated Events v2.3 (April 2016) with a hypopnea requiring 4% desaturations.  The channels recorded and monitored were frontal, central and occipital EEG, electrooculogram (EOG), submentalis EMG (chin), nasal and oral airflow, thoracic and abdominal wall motion, anterior tibialis EMG, snore microphone, electrocardiogram, and pulse oximetry.  MEDICATIONS Medications self-administered by patient taken the night of the study : none reported  SLEEP ARCHITECTURE The study was initiated at 10:47:38 PM and ended at 4:48:30 AM.  Sleep onset time was 40.8 minutes and the sleep efficiency was 85.8%. The total sleep time was 309.6 minutes.  Stage REM latency was 63.5 minutes.  The patient spent 2.3% of the night in stage N1 sleep, 76.6% in stage N2 sleep, 0.0% in stage N3 and 21.2% in REM.  Alpha intrusion was absent.  Supine sleep was 12.72%.  RESPIRATORY PARAMETERS The overall apnea/hypopnea index (AHI) was 0.2 per hour. There were 0 total apneas, including 0 obstructive, 0 central and 0 mixed apneas. There were 1 hypopneas and 0 RERAs.  The AHI during Stage REM sleep was 0.9 per hour.  AHI while supine was 0.0 per hour.  The mean oxygen saturation was 96.7%. The minimum SpO2 during sleep was 93.0%.  loud snoring was noted during this study.  CARDIAC DATA The 2 lead EKG demonstrated sinus rhythm. The mean heart rate was 76.4 beats per minute. Other EKG findings include:  None.  LEG MOVEMENT DATA The total PLMS were 0 with a resulting PLMS index of 0.0. Associated arousal with leg movement index was 0.0 .  IMPRESSIONS - No significant obstructive sleep apnea occurred during this study (AHI = 0.2/h). - The patient had minimal or no oxygen desaturation during the study (Min O2 = 93.0%) - The patient snored with loud snoring volume. - No cardiac abnormalities were noted during this study. - Clinically significant periodic limb movements did not occur during sleep. No significant associated arousals.  DIAGNOSIS - Primary Snoring  RECOMMENDATIONS - Consider Allergy and/ or ENT evaluation for snoring. Encourage normal body weight and sleep position off back. - Sleep hygiene should be reviewed to assess factors that may improve sleep quality. - Weight management and regular exercise should be initiated or continued if appropriate.  [Electronically signed] 01/06/2023 12:41 PM  Baird Lyons MD, [place], American Board of Sleep Medicine NPI: [id number]                         [place], [place] [facility]  ELECTRONICALLY SIGNED ON:  01/06/2023, 12:39 PM [place] PH: [phone]   FX: [phone] [place]

## 2023-01-07 ENCOUNTER — Other Ambulatory Visit: Payer: Self-pay | Admitting: Student

## 2023-01-07 MED ORDER — FLUCONAZOLE 150 MG PO TABS: 150.0000 mg | ORAL_TABLET | Freq: Once | ORAL | 0 refills | Status: AC

## 2023-01-15 ENCOUNTER — Encounter: Payer: Self-pay | Admitting: Student

## 2023-01-15 DIAGNOSIS — R0683 Snoring: Secondary | ICD-10-CM

## 2024-06-02 ENCOUNTER — Encounter: Payer: Self-pay | Admitting: *Deleted
